# Patient Record
Sex: Female | Born: 1935
Health system: Southern US, Community
[De-identification: ages and names within clinical notes are randomized; demographics above are authoritative.]

## PROBLEM LIST (undated history)

## (undated) DIAGNOSIS — I4510 Unspecified right bundle-branch block: Secondary | ICD-10-CM

## (undated) DIAGNOSIS — M1990 Unspecified osteoarthritis, unspecified site: Secondary | ICD-10-CM

## (undated) DIAGNOSIS — I10 Essential (primary) hypertension: Secondary | ICD-10-CM

## (undated) DIAGNOSIS — R002 Palpitations: Secondary | ICD-10-CM

## (undated) DIAGNOSIS — Z8679 Personal history of other diseases of the circulatory system: Secondary | ICD-10-CM

## (undated) HISTORY — DX: Palpitations: R00.2

## (undated) HISTORY — DX: Essential (primary) hypertension: I10

## (undated) HISTORY — DX: Unspecified right bundle-branch block: I45.10

## (undated) HISTORY — DX: Personal history of other diseases of the circulatory system: Z86.79

---

## 1999-11-21 ENCOUNTER — Ambulatory Visit (HOSPITAL_BASED_OUTPATIENT_CLINIC_OR_DEPARTMENT_OTHER): Admission: RE | Admit: 1999-11-21 | Discharge: 1999-11-21 | Payer: Self-pay | Admitting: Ophthalmology

## 2000-05-03 ENCOUNTER — Emergency Department (HOSPITAL_COMMUNITY): Admission: EM | Admit: 2000-05-03 | Discharge: 2000-05-03 | Payer: Self-pay | Admitting: Emergency Medicine

## 2000-05-03 ENCOUNTER — Encounter: Payer: Self-pay | Admitting: Emergency Medicine

## 2000-09-13 ENCOUNTER — Ambulatory Visit (HOSPITAL_COMMUNITY): Admission: RE | Admit: 2000-09-13 | Discharge: 2000-09-13 | Payer: Self-pay | Admitting: Family Medicine

## 2000-09-13 ENCOUNTER — Encounter: Payer: Self-pay | Admitting: Family Medicine

## 2001-07-21 ENCOUNTER — Emergency Department (HOSPITAL_COMMUNITY): Admission: EM | Admit: 2001-07-21 | Discharge: 2001-07-21 | Payer: Self-pay | Admitting: Emergency Medicine

## 2001-07-21 ENCOUNTER — Encounter: Payer: Self-pay | Admitting: Emergency Medicine

## 2002-06-18 ENCOUNTER — Emergency Department (HOSPITAL_COMMUNITY): Admission: EM | Admit: 2002-06-18 | Discharge: 2002-06-18 | Payer: Self-pay | Admitting: *Deleted

## 2002-06-18 ENCOUNTER — Encounter: Payer: Self-pay | Admitting: Emergency Medicine

## 2002-12-12 ENCOUNTER — Emergency Department (HOSPITAL_COMMUNITY): Admission: EM | Admit: 2002-12-12 | Discharge: 2002-12-12 | Payer: Self-pay | Admitting: Emergency Medicine

## 2003-04-06 ENCOUNTER — Emergency Department (HOSPITAL_COMMUNITY): Admission: EM | Admit: 2003-04-06 | Discharge: 2003-04-06 | Payer: Self-pay | Admitting: Emergency Medicine

## 2003-04-06 ENCOUNTER — Encounter: Payer: Self-pay | Admitting: Emergency Medicine

## 2003-10-13 ENCOUNTER — Ambulatory Visit (HOSPITAL_COMMUNITY): Admission: RE | Admit: 2003-10-13 | Discharge: 2003-10-13 | Payer: Self-pay | Admitting: Ophthalmology

## 2003-11-11 ENCOUNTER — Ambulatory Visit (HOSPITAL_COMMUNITY): Admission: RE | Admit: 2003-11-11 | Discharge: 2003-11-11 | Payer: Self-pay | Admitting: Vascular Surgery

## 2010-11-20 DIAGNOSIS — I4510 Unspecified right bundle-branch block: Secondary | ICD-10-CM

## 2010-11-20 HISTORY — DX: Unspecified right bundle-branch block: I45.10

## 2013-03-18 ENCOUNTER — Telehealth: Payer: Self-pay | Admitting: Internal Medicine

## 2013-03-18 NOTE — Telephone Encounter (Signed)
Please allow the phone to ring several times per Shannon Cruz . It takes her a little time to get to the phone .  Thanks

## 2013-03-18 NOTE — Telephone Encounter (Signed)
Pt is not feeling well.  Feet are swelling.  She is out of her pills.Marland KitchenMarland Kitchen

## 2013-03-18 NOTE — Telephone Encounter (Signed)
Returned call.  Pt c/o swelling around her feet and ankles.  Also c/o intermittent CP and SOB.  Denied both today.  Stated just swelling and she has not had her pills in a month.  Pt having difficulty hearing.  RN informed pt will call her back.  Call to pharmacy and informed pt has not refilled lisinopril/hctz or metoprolol since Jan. 2014.  Pt's sons on ROI and calls to them without success.  Serita Butcher, NP notified and advised appt tomorrow.  Appt scheduled w/ B. Hager, PA-C at 3:20pm and confirmed w/ pt.  Agreed to keep appt.

## 2013-03-19 ENCOUNTER — Encounter: Payer: Self-pay | Admitting: Pharmacist Clinician (PhC)/ Clinical Pharmacy Specialist

## 2013-03-19 ENCOUNTER — Encounter: Payer: Self-pay | Admitting: Internal Medicine

## 2013-03-19 ENCOUNTER — Encounter: Payer: Self-pay | Admitting: Physician Assistant

## 2013-03-19 ENCOUNTER — Ambulatory Visit (INDEPENDENT_AMBULATORY_CARE_PROVIDER_SITE_OTHER): Payer: PRIVATE HEALTH INSURANCE | Admitting: Physician Assistant

## 2013-03-19 VITALS — BP 168/72 | HR 80 | Ht 60.0 in | Wt 144.8 lb

## 2013-03-19 DIAGNOSIS — M25519 Pain in unspecified shoulder: Secondary | ICD-10-CM

## 2013-03-19 DIAGNOSIS — I4510 Unspecified right bundle-branch block: Secondary | ICD-10-CM | POA: Insufficient documentation

## 2013-03-19 DIAGNOSIS — I10 Essential (primary) hypertension: Secondary | ICD-10-CM

## 2013-03-19 DIAGNOSIS — M25512 Pain in left shoulder: Secondary | ICD-10-CM

## 2013-03-19 MED ORDER — LISINOPRIL-HYDROCHLOROTHIAZIDE 20-25 MG PO TABS: 1.0000 | ORAL_TABLET | Freq: Every day | ORAL | Status: DC

## 2013-03-19 MED ORDER — METOPROLOL SUCCINATE ER 25 MG PO TB24: 12.5000 mg | ORAL_TABLET | Freq: Every day | ORAL | Status: DC

## 2013-03-19 NOTE — Assessment & Plan Note (Signed)
Pressure is elevated however the patient reports running out of her medications. New prescriptions will be supplied.

## 2013-03-19 NOTE — Progress Notes (Signed)
Date:  03/19/2013   ID:  Shannon Cruz, DOB 01-Feb-1925, MRN [id number]  PCP:  No primary provider on file.  Primary Cardiologist:  Dr. Debara Pickett    History of Present Illness: Shannon Cruz is a 79 y.o. female the past medical history of hypertension hyperlipidemia and right bundle branch block. Patient reports today with complaints of the left shoulder pain which is also complaint back in January of 2014. Patient states it is worse with movement particularly when she is brushing her hair. She also reports some right lower extremity edema in her foot and ankle. She denies nausea, vomiting, fever, chest pain, shortness of breath, orthopnea, PND, dizziness.   Wt Readings from Last 3 Encounters:  03/19/13 144 lb 12.8 oz (65.681 kg)     Past Medical History  Diagnosis Date  . RBBB (right bundle branch block) 11/20/2010    Echo - EF >55%; flow pattern suggestive of impaired LV relaxation, proximal septal thickening noted; mitral valve mildly thickened; mild mitral annular calcification; aortic root sclerosis/calcification  . Hypertension   . H/O diastolic dysfunction   . Heart palpitations     Current Outpatient Prescriptions  Medication Sig Dispense Refill  . aspirin EC 81 MG tablet Take 81 mg by mouth daily.      Marland Kitchen lisinopril-hydrochlorothiazide (PRINZIDE,ZESTORETIC) 20-25 MG per tablet Take 1 tablet by mouth daily.      Marland Kitchen MAGNESIUM SALICYLATE PO Take 2 tablets by mouth 2 (two) times daily.      . metoprolol succinate (TOPROL-XL) 25 MG 24 hr tablet Take 12.5 mg by mouth daily.       No current facility-administered medications for this visit.    Allergies:   No Known Allergies  Social History:  The patient  reports that she quit smoking about 30 years ago. She does not have any smokeless tobacco history on file.   ROS:  Please see the history of present illness.    All other systems reviewed and negative.   PHYSICAL EXAM: VS:  BP 168/72  Pulse 80  Ht 5' (1.524 m)  Wt 144 lb  12.8 oz (65.681 kg)  BMI 28.28 kg/m2 Well nourished, well developed, in no acute distress HEENT: Pupils equal round reactive to light accommodation extraocular movements are intact Neck: Nontender supple no lymphadenopathy. Cardiac:  normal S1, S2; RRR; no murmur Lungs:  clear to auscultation bilaterally, no wheezing, rhonchi or rales Ext: Trace right lower extremity edema Skin: warm and dry Neuro:  Grossly normal  EKG:     Right bundle branch block normal sinus rhythm rate 80 beats per minute.  ASSESSMENT AND PLAN:  Problem List Items Addressed This Visit   HTN (hypertension)     Pressure is elevated however the patient reports running out of her medications. New prescriptions will be supplied.    RBBB (right bundle branch block)     Chronic problem.    Left shoulder pain - Primary     Left shoulder pain muscular skeletal and likely some osteoarthritis.     Other Visit Diagnoses   Essential hypertension, malignant        Relevant Orders       EKG 12-Lead

## 2013-03-19 NOTE — Assessment & Plan Note (Signed)
Chronic problem. 

## 2013-03-19 NOTE — Assessment & Plan Note (Signed)
Left shoulder pain muscular skeletal and likely some osteoarthritis.

## 2013-10-09 ENCOUNTER — Ambulatory Visit (INDEPENDENT_AMBULATORY_CARE_PROVIDER_SITE_OTHER): Payer: PRIVATE HEALTH INSURANCE | Admitting: Internal Medicine

## 2013-10-09 ENCOUNTER — Encounter: Payer: Self-pay | Admitting: Internal Medicine

## 2013-10-09 VITALS — BP 120/62 | HR 80 | Ht 60.0 in | Wt 140.6 lb

## 2013-10-09 DIAGNOSIS — I10 Essential (primary) hypertension: Secondary | ICD-10-CM

## 2013-10-09 DIAGNOSIS — I498 Other specified cardiac arrhythmias: Secondary | ICD-10-CM | POA: Insufficient documentation

## 2013-10-09 DIAGNOSIS — I4510 Unspecified right bundle-branch block: Secondary | ICD-10-CM

## 2013-10-09 MED ORDER — HYDROCHLOROTHIAZIDE 25 MG PO TABS: 25.0000 mg | ORAL_TABLET | Freq: Every day | ORAL | Status: DC

## 2013-10-09 MED ORDER — METOPROLOL SUCCINATE ER 25 MG PO TB24: 25.0000 mg | ORAL_TABLET | Freq: Every day | ORAL | Status: DC

## 2013-10-09 MED ORDER — LISINOPRIL 10 MG PO TABS: 10.0000 mg | ORAL_TABLET | Freq: Every day | ORAL | Status: DC

## 2013-10-09 NOTE — Progress Notes (Signed)
OFFICE NOTE  Chief Complaint:  Grieving over loss of her son  Primary Care Physician: No primary provider on file.  HPI:  Shannon Cruz is a pleasant 79 year old female with hypertension and some difficulty with gait and dizziness. She has a known right bundle branch block and some diastolic dysfunction. She has recently had some left shoulder pain as well as some palpitations and is not on a beta-blocker. She denies any chest pain, worsening shortness of breath or lower extremity swelling. She was noted in the recent past to have atrial bigeminy and continues in that rhythm today. She occasionally takes a full dose of Toprol XL with improved control of her palpitations. Her main issue today is the recent death of her son but she outlined in great detail in the office. He died of a liver tumor.  PMHx:  Past Medical History  Diagnosis Date  . RBBB (right bundle branch block) 11/20/2010    Echo - EF >55%; flow pattern suggestive of impaired LV relaxation, proximal septal thickening noted; mitral valve mildly thickened; mild mitral annular calcification; aortic root sclerosis/calcification  . Hypertension   . H/O diastolic dysfunction   . Heart palpitations     History reviewed. No pertinent past surgical history.  FAMHx:  No family history on file.  SOCHx:   reports that she quit smoking about 30 years ago. She does not have any smokeless tobacco history on file. Her alcohol and drug histories are not on file.  ALLERGIES:  No Known Allergies  ROS: A comprehensive review of systems was negative except for: Cardiovascular: positive for palpitations  HOME MEDS: Current Outpatient Prescriptions  Medication Sig Dispense Refill  . aspirin EC 81 MG tablet Take 81 mg by mouth daily.      Marland Kitchen MAGNESIUM SALICYLATE PO Take by mouth. Takes occasionally      . metoprolol succinate (TOPROL-XL) 25 MG 24 hr tablet Take 1 tablet (25 mg total) by mouth daily.  90 tablet  3  . RESTASIS  0.05 % ophthalmic emulsion Place 1 drop into both eyes 2 (two) times daily.      Marland Kitchen tobramycin-dexamethasone (TOBRADEX) ophthalmic solution daily.      . hydrochlorothiazide (HYDRODIURIL) 25 MG tablet Take 1 tablet (25 mg total) by mouth daily.  90 tablet  3  . lisinopril (PRINIVIL,ZESTRIL) 10 MG tablet Take 1 tablet (10 mg total) by mouth daily.  90 tablet  3   No current facility-administered medications for this visit.    LABS/IMAGING: No results found for this or any previous visit (from the past 48 hour(s)). No results found.  VITALS: BP 120/62  Pulse 80  Ht 5' (1.524 m)  Wt 140 lb 9.6 oz (63.776 kg)  BMI 27.46 kg/m2  EXAM: General appearance: alert and no distress Neck: no carotid bruit and no JVD Lungs: clear to auscultation bilaterally Heart: regular rate and rhythm Abdomen: soft, non-tender; bowel sounds normal; no masses,  no organomegaly Extremities: extremities normal, atraumatic, no cyanosis or edema Pulses: 2+ and symmetric Skin: Skin color, texture, turgor normal. No rashes or lesions Neurologic: Grossly normal Psych: Tearful, grieving  EKG: Sinus rhythm with atrial bigeminy at 80, right bundle branch block  ASSESSMENT: 1. Atrial bigeminy 2. Hypertension-controlled 3. Grieving  PLAN: 1.   Mrs. Clendenning continues to have atrial bigeminy which was not suppressed by low-dose Toprol-XL. I recommend increasing her Toprol-XL 25 mg daily. Because her blood pressure is low normal, I would therefore decrease her lisinopril HCTZ 10/25 mg  daily. Plan to see her back in 6 months.  Pixie Casino, MD, [facility] Attending Cardiologist [facility]  Monquie Fulgham C 10/09/2013, 3:31 PM

## 2013-10-09 NOTE — Patient Instructions (Addendum)
Your physician has recommended you make the following change in your medication:  TAKE metoprolol succinate (Toprol XL) 25mg  daily. TAKE lisinopril 10mg  & hydrochlorothiazide 25mg   Your physician wants you to follow-up in: 6 months. You will receive a reminder letter in the mail two months in advance. If you don't receive a letter, please call our office to schedule the follow-up appointment.

## 2014-02-12 DIAGNOSIS — N390 Urinary tract infection, site not specified: Secondary | ICD-10-CM | POA: Insufficient documentation

## 2014-02-12 DIAGNOSIS — N289 Disorder of kidney and ureter, unspecified: Secondary | ICD-10-CM | POA: Insufficient documentation

## 2014-02-12 DIAGNOSIS — IMO0002 Reserved for concepts with insufficient information to code with codable children: Secondary | ICD-10-CM | POA: Insufficient documentation

## 2014-02-12 DIAGNOSIS — Z7982 Long term (current) use of aspirin: Secondary | ICD-10-CM | POA: Insufficient documentation

## 2014-02-12 DIAGNOSIS — M549 Dorsalgia, unspecified: Secondary | ICD-10-CM | POA: Insufficient documentation

## 2014-02-12 DIAGNOSIS — Z87891 Personal history of nicotine dependence: Secondary | ICD-10-CM | POA: Insufficient documentation

## 2014-02-12 DIAGNOSIS — I4510 Unspecified right bundle-branch block: Secondary | ICD-10-CM | POA: Insufficient documentation

## 2014-02-12 DIAGNOSIS — Z79899 Other long term (current) drug therapy: Secondary | ICD-10-CM | POA: Insufficient documentation

## 2014-02-12 DIAGNOSIS — I10 Essential (primary) hypertension: Secondary | ICD-10-CM | POA: Insufficient documentation

## 2014-02-13 ENCOUNTER — Emergency Department (HOSPITAL_COMMUNITY): Payer: PRIVATE HEALTH INSURANCE

## 2014-02-13 ENCOUNTER — Encounter (HOSPITAL_COMMUNITY): Payer: Self-pay | Admitting: Emergency Medicine

## 2014-02-13 ENCOUNTER — Emergency Department (HOSPITAL_COMMUNITY)
Admission: EM | Admit: 2014-02-13 | Discharge: 2014-02-13 | Disposition: A | Discharge status: Home / Self Care | Payer: PRIVATE HEALTH INSURANCE | Attending: Emergency Medicine | Admitting: Emergency Medicine

## 2014-02-13 DIAGNOSIS — M549 Dorsalgia, unspecified: Secondary | ICD-10-CM

## 2014-02-13 DIAGNOSIS — N390 Urinary tract infection, site not specified: Secondary | ICD-10-CM

## 2014-02-13 DIAGNOSIS — N289 Disorder of kidney and ureter, unspecified: Secondary | ICD-10-CM

## 2014-02-13 LAB — CBC WITH DIFFERENTIAL/PLATELET
Basophils Absolute: 0.1 10*3/uL (ref 0.0–0.1)
Basophils Relative: 1 % (ref 0–1)
EOS PCT: 4 % (ref 0–5)
Eosinophils Absolute: 0.3 10*3/uL (ref 0.0–0.7)
HEMATOCRIT: 34.6 % — AB (ref 36.0–46.0)
Hemoglobin: 11 g/dL — ABNORMAL LOW (ref 12.0–15.0)
LYMPHS ABS: 1.8 10*3/uL (ref 0.7–4.0)
Lymphocytes Relative: 18 % (ref 12–46)
MCH: 26.8 pg (ref 26.0–34.0)
MCHC: 31.8 g/dL (ref 30.0–36.0)
MCV: 84.2 fL (ref 78.0–100.0)
Monocytes Absolute: 0.8 10*3/uL (ref 0.1–1.0)
Monocytes Relative: 8 % (ref 3–12)
Neutro Abs: 6.8 10*3/uL (ref 1.7–7.7)
Neutrophils Relative %: 70 % (ref 43–77)
Platelets: 298 10*3/uL (ref 150–400)
RBC: 4.11 MIL/uL (ref 3.87–5.11)
RDW: 14.4 % (ref 11.5–15.5)
WBC: 9.8 10*3/uL (ref 4.0–10.5)

## 2014-02-13 LAB — COMPREHENSIVE METABOLIC PANEL
ALT: 7 U/L (ref 0–35)
AST: 20 U/L (ref 0–37)
Albumin: 3.7 g/dL (ref 3.5–5.2)
Alkaline Phosphatase: 79 U/L (ref 39–117)
BUN: 43 mg/dL — ABNORMAL HIGH (ref 6–23)
CALCIUM: 10.1 mg/dL (ref 8.4–10.5)
CO2: 24 meq/L (ref 19–32)
CREATININE: 1.57 mg/dL — AB (ref 0.50–1.10)
Chloride: 99 mEq/L (ref 96–112)
GFR, EST AFRICAN AMERICAN: 33 mL/min — AB (ref >90)
GFR, EST NON AFRICAN AMERICAN: 29 mL/min — AB (ref >90)
GLUCOSE: 107 mg/dL — AB (ref 70–99)
Potassium: 4 mEq/L (ref 3.7–5.3)
Sodium: 138 mEq/L (ref 137–147)
Total Bilirubin: <0.2 mg/dL — ABNORMAL LOW (ref 0.3–1.2)
Total Protein: 7.8 g/dL (ref 6.0–8.3)

## 2014-02-13 LAB — URINALYSIS, ROUTINE W REFLEX MICROSCOPIC
Bilirubin Urine: NEGATIVE
Glucose, UA: NEGATIVE mg/dL
Ketones, ur: NEGATIVE mg/dL
Nitrite: POSITIVE — AB
PROTEIN: NEGATIVE mg/dL
Specific Gravity, Urine: 1.01 (ref 1.005–1.030)
Urobilinogen, UA: 0.2 mg/dL (ref 0.0–1.0)
pH: 5.5 (ref 5.0–8.0)

## 2014-02-13 LAB — URINE MICROSCOPIC-ADD ON

## 2014-02-13 LAB — LIPASE, BLOOD: LIPASE: 56 U/L (ref 11–59)

## 2014-02-13 MED ORDER — ONDANSETRON 8 MG PO TBDP: 8.0000 mg | ORAL_TABLET | Freq: Three times a day (TID) | ORAL | Status: DC | PRN

## 2014-02-13 MED ORDER — CEPHALEXIN 500 MG PO CAPS: 500.0000 mg | ORAL_CAPSULE | Freq: Four times a day (QID) | ORAL | Status: DC

## 2014-02-13 MED ORDER — HYDROCODONE-ACETAMINOPHEN 5-325 MG PO TABS: 1.0000 | ORAL_TABLET | ORAL | Status: DC | PRN

## 2014-02-13 MED ORDER — DEXTROSE 5 % IV SOLN
1.0000 g | Freq: Once | INTRAVENOUS | Status: AC
  Administered 2014-02-13: 1 g via INTRAVENOUS
  Filled 2014-02-13: qty 10

## 2014-02-13 NOTE — ED Provider Notes (Signed)
CSN: [id number]     Arrival date & time 02/12/14  2356 History   First MD Initiated Contact with Patient 02/13/14 0330     Chief Complaint  Patient presents with  . Back Pain      HPI Patient reports developing low back pain and fever earlier today.  She denies urinary complaints.  She states the pain radiates around to her abdomen and is primarily located on the left side with radiation towards the left abdomen.  She denies anterior abdominal pain.  No chest pain or shortness of breath.  No chills.  No documentation of fever at home, more subjective fever.  Patient is a history of hypertension.  No diarrhea.  Mild nausea.  No vomiting.  Past Medical History  Diagnosis Date  . RBBB (right bundle branch block) 11/20/2010    Echo - EF >55%; flow pattern suggestive of impaired LV relaxation, proximal septal thickening noted; mitral valve mildly thickened; mild mitral annular calcification; aortic root sclerosis/calcification  . Hypertension   . H/O diastolic dysfunction   . Heart palpitations    History reviewed. No pertinent past surgical history. History reviewed. No pertinent family history. History  Substance Use Topics  . Smoking status: Former Smoker    Quit date: 11/05/1982  . Smokeless tobacco: Not on file  . Alcohol Use: No   OB History   Grav Para Term Preterm Abortions TAB SAB Ect Mult Living                 Review of Systems  All other systems reviewed and are negative.     Allergies  Review of patient's allergies indicates no known allergies.  Home Medications   Current Outpatient Rx  Name  Route  Sig  Dispense  Refill  . aspirin EC 81 MG tablet   Oral   Take 81 mg by mouth daily.         . hydrochlorothiazide (HYDRODIURIL) 25 MG tablet   Oral   Take 1 tablet (25 mg total) by mouth daily.   90 tablet   3   . ibuprofen (ADVIL,MOTRIN) 600 MG tablet   Oral   Take 1,200 mg by mouth 2 (two) times daily as needed for moderate pain.         Marland Kitchen  lisinopril (PRINIVIL,ZESTRIL) 10 MG tablet   Oral   Take 1 tablet (10 mg total) by mouth daily.   90 tablet   3   . metoprolol succinate (TOPROL-XL) 25 MG 24 hr tablet   Oral   Take 1 tablet (25 mg total) by mouth daily.   90 tablet   3   . prednisoLONE acetate (PRED FORTE) 1 % ophthalmic suspension   Both Eyes   Place 1 drop into both eyes 2 (two) times daily.         . cephALEXin (KEFLEX) 500 MG capsule   Oral   Take 1 capsule (500 mg total) by mouth 4 (four) times daily.   28 capsule   0   . HYDROcodone-acetaminophen (NORCO/VICODIN) 5-325 MG per tablet   Oral   Take 1 tablet by mouth every 4 (four) hours as needed for moderate pain.   15 tablet   0    BP 139/56  Pulse 64  Temp(Src) 98.2 F (36.8 C) (Oral)  Resp 17  Wt 132 lb (59.875 kg)  SpO2 99% Physical Exam  Nursing note and vitals reviewed. Constitutional: She is oriented to person, place, and time. She appears well-developed and  well-nourished. No distress.  HENT:  Head: Normocephalic and atraumatic.  Eyes: EOM are normal.  Neck: Normal range of motion.  Cardiovascular: Normal rate, regular rhythm and normal heart sounds.   Pulmonary/Chest: Effort normal and breath sounds normal.  Abdominal: Soft. She exhibits no distension. There is no tenderness.  Genitourinary:  Mild left CVA tenderness.  No right CVA tenderness.  Musculoskeletal: Normal range of motion.  Neurological: She is alert and oriented to person, place, and time.  Skin: Skin is warm and dry.  Psychiatric: She has a normal mood and affect. Judgment normal.    ED Course  Procedures (including critical care time) Labs Review Labs Reviewed  URINALYSIS, ROUTINE W REFLEX MICROSCOPIC - Abnormal; Notable for the following:    APPearance CLOUDY (*)    Hgb urine dipstick TRACE (*)    Nitrite POSITIVE (*)    Leukocytes, UA LARGE (*)    All other components within normal limits  CBC WITH DIFFERENTIAL - Abnormal; Notable for the following:     Hemoglobin 11.0 (*)    HCT 34.6 (*)    All other components within normal limits  COMPREHENSIVE METABOLIC PANEL - Abnormal; Notable for the following:    Glucose, Bld 107 (*)    BUN 43 (*)    Creatinine, Ser 1.57 (*)    Total Bilirubin <0.2 (*)    GFR calc non Af Amer 29 (*)    GFR calc Af Amer 33 (*)    All other components within normal limits  URINE MICROSCOPIC-ADD ON - Abnormal; Notable for the following:    Bacteria, UA MANY (*)    All other components within normal limits  URINE CULTURE  LIPASE, BLOOD   Imaging Review Ct Abdomen Pelvis Wo Contrast  02/13/2014   CLINICAL DATA:  Fever and right flank pain since yesterday.  EXAM: CT ABDOMEN AND PELVIS WITHOUT CONTRAST  TECHNIQUE: Multidetector CT imaging of the abdomen and pelvis was performed following the standard protocol without intravenous contrast.  COMPARISON:  None.  FINDINGS: Lung bases are clear.  No renal, ureteral, or bladder stones are visualized. No pyelocaliectasis or ureterectasis. No bladder wall thickening. Low-attenuation lesion in the upper pole of the left kidney with Hounsfield unit measurements consistent with cyst. Multiple calcified phleboliths in the pelvis.  There is a 16 mm diameter rounded calcification in the right lower quadrant which may represent dystrophic calcification the appendix is normal. Scattered calcified granulomas in the liver. The spleen size is normal. Gallbladder, pancreas, adrenal glands, inferior vena cava, and retroperitoneal lymph nodes are unremarkable. Calcification of abdominal aorta with focal ectasia. No aneurysm. Small and large bowel are decompressed. No free air or free fluid in the abdomen.  Pelvis: Calcifications in the uterus consistent with fibroids. No abnormal adnexal masses. No free or loculated pelvic fluid collections. Diverticula in the sigmoid colon without diverticulitis. Degenerative changes in the spine and hips. No destructive bone lesions appreciated.  IMPRESSION: No  renal or ureteral stone or obstruction. Presumed left renal cyst. No acute process suggest.   Electronically Signed   By: Lucienne Capers M.D.   On: 02/13/2014 05:38  I personally reviewed the imaging tests through PACS system I reviewed available ER/hospitalization records through the EMR    EKG Interpretation None      MDM   Final diagnoses:  Back pain  Urinary tract infection  Renal insufficiency    6:23 AM Patient feels much better at this time.  Discharge home in good condition.  Suspect urinary  tract infection with developing a sending infection/pyelonephritis.  Vital signs stable.  Instructions given to patient and patient's son.  They understand to return to the ER for new or worsening symptoms.  Rocephin in the emergency department.  Home with Keflex.  Urine culture.  Home with pain medication and nausea medicine.    Hoy Morn, MD 02/13/14 [phone]

## 2014-02-13 NOTE — ED Notes (Signed)
MD at bedside. 

## 2014-02-13 NOTE — ED Notes (Signed)
Patient transported to CT 

## 2014-02-13 NOTE — ED Notes (Signed)
Pt arrived to the Ed with a complaint of back pain that lead to a fever.  Pt had a pain in the back at 1630 yesterday.  Pt used rubbing alcohol on her extremities and took 1200 mg Ibuprofen.  Pt states her lower back still hurts

## 2014-02-13 NOTE — Discharge Instructions (Signed)
Urinary Tract Infection  Urinary tract infections (UTIs) can develop anywhere along your urinary tract. Your urinary tract is your body's drainage system for removing wastes and extra water. Your urinary tract includes two kidneys, two ureters, a bladder, and a urethra. Your kidneys are a pair of bean-shaped organs. Each kidney is about the size of your fist. They are located below your ribs, one on each side of your spine.  CAUSES  Infections are caused by microbes, which are microscopic organisms, including fungi, viruses, and bacteria. These organisms are so small that they can only be seen through a microscope. Bacteria are the microbes that most commonly cause UTIs.  SYMPTOMS   Symptoms of UTIs may vary by age and gender of the patient and by the location of the infection. Symptoms in young women typically include a frequent and intense urge to urinate and a painful, burning feeling in the bladder or urethra during urination. Older women and men are more likely to be tired, shaky, and weak and have muscle aches and abdominal pain. A fever may mean the infection is in your kidneys. Other symptoms of a kidney infection include pain in your back or sides below the ribs, nausea, and vomiting.  DIAGNOSIS  To diagnose a UTI, your caregiver will ask you about your symptoms. Your caregiver also will ask to provide a urine sample. The urine sample will be tested for bacteria and white blood cells. White blood cells are made by your body to help fight infection.  TREATMENT   Typically, UTIs can be treated with medication. Because most UTIs are caused by a bacterial infection, they usually can be treated with the use of antibiotics. The choice of antibiotic and length of treatment depend on your symptoms and the type of bacteria causing your infection.  HOME CARE INSTRUCTIONS   If you were prescribed antibiotics, take them exactly as your caregiver instructs you. Finish the medication even if you feel better after you  have only taken some of the medication.   Drink enough water and fluids to keep your urine clear or pale yellow.   Avoid caffeine, tea, and carbonated beverages. They tend to irritate your bladder.   Empty your bladder often. Avoid holding urine for long periods of time.   Empty your bladder before and after sexual intercourse.   After a bowel movement, women should cleanse from front to back. Use each tissue only once.  SEEK MEDICAL CARE IF:    You have back pain.   You develop a fever.   Your symptoms do not begin to resolve within 3 days.  SEEK IMMEDIATE MEDICAL CARE IF:    You have severe back pain or lower abdominal pain.   You develop chills.   You have nausea or vomiting.   You have continued burning or discomfort with urination.  MAKE SURE YOU:    Understand these instructions.   Will watch your condition.   Will get help right away if you are not doing well or get worse.  Document Released: 08/01/2005 Document Revised: 04/22/2012 Document Reviewed: 11/30/2011  [employer] Patient Information 2014 [employer]

## 2014-02-15 LAB — URINE CULTURE: Colony Count: >=100000

## 2014-04-12 ENCOUNTER — Ambulatory Visit: Payer: PRIVATE HEALTH INSURANCE | Admitting: Internal Medicine

## 2014-04-27 ENCOUNTER — Ambulatory Visit: Payer: PRIVATE HEALTH INSURANCE | Admitting: Internal Medicine

## 2014-05-06 ENCOUNTER — Ambulatory Visit: Payer: PRIVATE HEALTH INSURANCE | Admitting: Internal Medicine

## 2014-06-08 ENCOUNTER — Ambulatory Visit: Payer: PRIVATE HEALTH INSURANCE | Admitting: Internal Medicine

## 2014-07-06 ENCOUNTER — Other Ambulatory Visit (HOSPITAL_COMMUNITY): Payer: Self-pay | Admitting: Family Medicine

## 2014-07-06 DIAGNOSIS — I498 Other specified cardiac arrhythmias: Secondary | ICD-10-CM

## 2014-07-07 ENCOUNTER — Other Ambulatory Visit (HOSPITAL_COMMUNITY): Payer: PRIVATE HEALTH INSURANCE

## 2014-07-12 ENCOUNTER — Emergency Department (HOSPITAL_COMMUNITY)
Admission: EM | Admit: 2014-07-12 | Discharge: 2014-07-12 | Disposition: A | Discharge status: Home / Self Care | Payer: PRIVATE HEALTH INSURANCE | Attending: Emergency Medicine | Admitting: Emergency Medicine

## 2014-07-12 ENCOUNTER — Emergency Department (HOSPITAL_COMMUNITY): Payer: PRIVATE HEALTH INSURANCE

## 2014-07-12 ENCOUNTER — Encounter (HOSPITAL_COMMUNITY): Payer: Self-pay | Admitting: Emergency Medicine

## 2014-07-12 DIAGNOSIS — IMO0002 Reserved for concepts with insufficient information to code with codable children: Secondary | ICD-10-CM | POA: Diagnosis not present

## 2014-07-12 DIAGNOSIS — Z7689 Persons encountering health services in other specified circumstances: Secondary | ICD-10-CM | POA: Insufficient documentation

## 2014-07-12 DIAGNOSIS — M129 Arthropathy, unspecified: Secondary | ICD-10-CM | POA: Diagnosis not present

## 2014-07-12 DIAGNOSIS — I498 Other specified cardiac arrhythmias: Secondary | ICD-10-CM | POA: Insufficient documentation

## 2014-07-12 DIAGNOSIS — I10 Essential (primary) hypertension: Secondary | ICD-10-CM | POA: Insufficient documentation

## 2014-07-12 DIAGNOSIS — Z7982 Long term (current) use of aspirin: Secondary | ICD-10-CM | POA: Insufficient documentation

## 2014-07-12 DIAGNOSIS — Z79899 Other long term (current) drug therapy: Secondary | ICD-10-CM | POA: Insufficient documentation

## 2014-07-12 DIAGNOSIS — R008 Other abnormalities of heart beat: Secondary | ICD-10-CM

## 2014-07-12 HISTORY — DX: Unspecified osteoarthritis, unspecified site: M19.90

## 2014-07-12 LAB — URINALYSIS, ROUTINE W REFLEX MICROSCOPIC
Bilirubin Urine: NEGATIVE
Glucose, UA: NEGATIVE mg/dL
Hgb urine dipstick: NEGATIVE
KETONES UR: NEGATIVE mg/dL
Leukocytes, UA: NEGATIVE
NITRITE: NEGATIVE
Protein, ur: NEGATIVE mg/dL
SPECIFIC GRAVITY, URINE: 1.005 (ref 1.005–1.030)
Urobilinogen, UA: 0.2 mg/dL (ref 0.0–1.0)
pH: 6.5 (ref 5.0–8.0)

## 2014-07-12 LAB — COMPREHENSIVE METABOLIC PANEL
ALT: 6 U/L (ref 0–35)
ANION GAP: 14 (ref 5–15)
AST: 20 U/L (ref 0–37)
Albumin: 3.6 g/dL (ref 3.5–5.2)
Alkaline Phosphatase: 98 U/L (ref 39–117)
BILIRUBIN TOTAL: 0.3 mg/dL (ref 0.3–1.2)
BUN: 26 mg/dL — AB (ref 6–23)
CHLORIDE: 96 meq/L (ref 96–112)
CO2: 27 meq/L (ref 19–32)
Calcium: 9.5 mg/dL (ref 8.4–10.5)
Creatinine, Ser: 1.56 mg/dL — ABNORMAL HIGH (ref 0.50–1.10)
GFR calc Af Amer: 33 mL/min — ABNORMAL LOW (ref >90)
GFR, EST NON AFRICAN AMERICAN: 29 mL/min — AB (ref >90)
Glucose, Bld: 108 mg/dL — ABNORMAL HIGH (ref 70–99)
POTASSIUM: 4 meq/L (ref 3.7–5.3)
Sodium: 137 mEq/L (ref 137–147)
Total Protein: 7.7 g/dL (ref 6.0–8.3)

## 2014-07-12 LAB — CBC WITH DIFFERENTIAL/PLATELET
BASOS ABS: 0.1 10*3/uL (ref 0.0–0.1)
Basophils Relative: 1 % (ref 0–1)
EOS PCT: 2 % (ref 0–5)
Eosinophils Absolute: 0.1 10*3/uL (ref 0.0–0.7)
HCT: 36.1 % (ref 36.0–46.0)
Hemoglobin: 11.6 g/dL — ABNORMAL LOW (ref 12.0–15.0)
LYMPHS PCT: 19 % (ref 12–46)
Lymphs Abs: 1.3 10*3/uL (ref 0.7–4.0)
MCH: 26.8 pg (ref 26.0–34.0)
MCHC: 32.1 g/dL (ref 30.0–36.0)
MCV: 83.4 fL (ref 78.0–100.0)
Monocytes Absolute: 0.5 10*3/uL (ref 0.1–1.0)
Monocytes Relative: 8 % (ref 3–12)
NEUTROS ABS: 5.1 10*3/uL (ref 1.7–7.7)
Neutrophils Relative %: 72 % (ref 43–77)
PLATELETS: 405 10*3/uL — AB (ref 150–400)
RBC: 4.33 MIL/uL (ref 3.87–5.11)
RDW: 14.2 % (ref 11.5–15.5)
WBC: 7.1 10*3/uL (ref 4.0–10.5)

## 2014-07-12 LAB — TROPONIN I

## 2014-07-12 NOTE — ED Notes (Signed)
Patient transported to Ultrasound 

## 2014-07-12 NOTE — ED Notes (Signed)
Patient states her doctor sent her here to get tests.   I asked what symptoms she was having, but she states "none".   Patient's daughter at patient side and states that the doctor just wanted a lot of tests to "make sure patient was okay".  But when I asked what problems she had been having, she stated "none" also.

## 2014-07-12 NOTE — ED Notes (Signed)
Pt returned from US

## 2014-07-12 NOTE — ED Notes (Signed)
Dr. Miller at the bedside.  

## 2014-07-12 NOTE — ED Provider Notes (Signed)
CSN: [id number]     Arrival date & time 07/12/14  0831 History   First MD Initiated Contact with Patient 07/12/14 [phone]     Chief Complaint  Patient presents with  . primary doctor sent      (Consider location/radiation/quality/duration/timing/severity/associated sxs/prior Treatment) HPI Comments: 80 y/o female with hx of Htn, heart palpitations (last echo - EF 55% - 2012), presents b/c according to the family members the family doctor wants her to get evaluated with lots of testing because of her slightly slow heart rate. Of note the patient does take Toprol-XL for blood pressure. The patient has absolutely no complaints other than occasional dysuria. The daughter who accompanies the patient today states that this was the first time that she has been able to come to the hospital because of her job but was told 6 days ago that she needed a workup. The patient has not had any chest pain, syncope, lightheadedness, headache, nausea, vomiting or any other complaints whatsoever.  The history is provided by the patient and a relative.    Past Medical History  Diagnosis Date  . RBBB (right bundle branch block) 11/20/2010    Echo - EF >55%; flow pattern suggestive of impaired LV relaxation, proximal septal thickening noted; mitral valve mildly thickened; mild mitral annular calcification; aortic root sclerosis/calcification  . Hypertension   . H/O diastolic dysfunction   . Heart palpitations   . Arthritis    History reviewed. No pertinent past surgical history. No family history on file. History  Substance Use Topics  . Smoking status: Former Smoker    Quit date: 11/05/1982  . Smokeless tobacco: Not on file  . Alcohol Use: No   OB History   Grav Para Term Preterm Abortions TAB SAB Ect Mult Living                 Review of Systems  All other systems reviewed and are negative.     Allergies  Review of patient's allergies indicates no known allergies.  Home Medications   Prior to  Admission medications   Medication Sig Start Date End Date Taking? Authorizing Provider  amLODipine (NORVASC) 10 MG tablet Take 1 tablet by mouth daily. 07/06/14  Yes Historical Provider, MD  aspirin 81 MG tablet Take 81 mg by mouth daily.   Yes Historical Provider, MD  furosemide (LASIX) 20 MG tablet Take 1 tablet by mouth daily. 07/06/14  Yes Historical Provider, MD  HYDROcodone-acetaminophen (NORCO/VICODIN) 5-325 MG per tablet Take 1 tablet by mouth every 4 (four) hours as needed for moderate pain. 02/13/14  Yes Hoy Morn, MD  prednisoLONE acetate (PRED FORTE) 1 % ophthalmic suspension Place 1 drop into both eyes 2 (two) times daily.   Yes Historical Provider, MD   BP 161/56  Pulse 78  Temp(Src) 98.4 F (36.9 C) (Oral)  Resp 19  Ht 5\' 2"  (1.575 m)  Wt 132 lb (59.875 kg)  BMI 24.14 kg/m2  SpO2 100% Physical Exam  Nursing note and vitals reviewed. Constitutional: She appears well-developed and well-nourished. No distress.  HENT:  Head: Normocephalic and atraumatic.  Mouth/Throat: Oropharynx is clear and moist. No oropharyngeal exudate.  Eyes: Conjunctivae and EOM are normal. Pupils are equal, round, and reactive to light. Right eye exhibits no discharge. Left eye exhibits no discharge. No scleral icterus.  Neck: Normal range of motion. Neck supple. No JVD present. No thyromegaly present.  Cardiovascular: Normal heart sounds and intact distal pulses.  Exam reveals no gallop and no friction  rub.   No murmur heard. Auscultated heart sounds, appears to have bigeminy, no murmurs  Pulmonary/Chest: Effort normal and breath sounds normal. No respiratory distress. She has no wheezes. She has no rales.  Abdominal: Soft. Bowel sounds are normal. She exhibits no distension and no mass. There is no tenderness.  Abdominal aorta palpated very easily, feels slightly enlarged on initial exam  Musculoskeletal: Normal range of motion. She exhibits no edema and no tenderness.  Lymphadenopathy:    She  has no cervical adenopathy.  Neurological: She is alert. Coordination normal.  Skin: Skin is warm and dry. No rash noted. No erythema.  Psychiatric: She has a normal mood and affect. Her behavior is normal.    ED Course  Procedures (including critical care time) Labs Review Labs Reviewed  COMPREHENSIVE METABOLIC PANEL - Abnormal; Notable for the following:    Glucose, Bld 108 (*)    BUN 26 (*)    Creatinine, Ser 1.56 (*)    GFR calc non Af Amer 29 (*)    GFR calc Af Amer 33 (*)    All other components within normal limits  CBC WITH DIFFERENTIAL - Abnormal; Notable for the following:    Hemoglobin 11.6 (*)    Platelets 405 (*)    All other components within normal limits  TROPONIN I  URINALYSIS, ROUTINE W REFLEX MICROSCOPIC    Imaging Review US Aorta  07/12/2014   CLINICAL DATA:  Pulsatile abdominal mass.  EXAM: ULTRASOUND OF ABDOMINAL AORTA  TECHNIQUE: Ultrasound examination of the abdominal aorta was performed to evaluate for abdominal aortic aneurysm.  COMPARISON:  Unenhanced CT dated 02/13/2014  FINDINGS: Abdominal Aorta  No aneurysm identified. Diffuse calcified atherosclerotic plaque is present.  Maximum AP  Diameter:  2.3 cm  Maximum TRV  Diameter: 2.7 cm  IMPRESSION: Atherosclerosis of the abdominal aorta without aneurysmal dilatation.   Electronically Signed   By: Aletta Edouard M.D.   On: 07/12/2014 10:45     EKG Interpretation   Date/Time:  Monday July 12 2014 09:02:15 EDT Ventricular Rate:  86 PR Interval:  225 QRS Duration: 155 QT Interval:  459 QTC Calculation: 549 R Axis:   50 Text Interpretation:  Bigeminy. Supraventricular bigeminy Prolonged PR  interval Right bundle branch block Since last tracing bigeminy now present  Right bundle branch block now found Confirmed by Adriana Quinby  MD, Jenniah Bhavsar ([id number])  on 07/12/2014 9:10:31 AM      MDM   Final diagnoses:  Supraventricular bigeminy    The patient is well-appearing, she has no focal symptoms, she has  intermittent dysuria but this is not acute, CT scan from April evaluated, no signs of abdominal aortic aneurysm at that time, ultrasound ordered to confirm. The patient is a beta blocker, a mild bradycardia would not be unexpected, rule out atrioventricular block or other cardiac abnormalities on EKG and electrolytes.  Labs and ECG without acute worrisome findings - d/w pt and family member - they are stable to f/u - no bradycardia seen here - triage VS documented based on sat monitor - there only bigeminy.  Meds given in ED:  Medications - No data to display  New Prescriptions   No medications on file      Johnna Acosta, MD 07/12/14 1117

## 2014-08-02 ENCOUNTER — Encounter: Payer: Self-pay | Admitting: Internal Medicine

## 2014-09-30 ENCOUNTER — Emergency Department (HOSPITAL_COMMUNITY)
Admission: EM | Admit: 2014-09-30 | Discharge: 2014-09-30 | Disposition: A | Discharge status: Home / Self Care | Payer: PRIVATE HEALTH INSURANCE | Attending: Emergency Medicine | Admitting: Emergency Medicine

## 2014-09-30 ENCOUNTER — Emergency Department (HOSPITAL_COMMUNITY): Payer: PRIVATE HEALTH INSURANCE

## 2014-09-30 ENCOUNTER — Encounter (HOSPITAL_COMMUNITY): Payer: Self-pay | Admitting: Emergency Medicine

## 2014-09-30 DIAGNOSIS — Z87891 Personal history of nicotine dependence: Secondary | ICD-10-CM | POA: Diagnosis not present

## 2014-09-30 DIAGNOSIS — I10 Essential (primary) hypertension: Secondary | ICD-10-CM | POA: Insufficient documentation

## 2014-09-30 DIAGNOSIS — Z79899 Other long term (current) drug therapy: Secondary | ICD-10-CM | POA: Insufficient documentation

## 2014-09-30 DIAGNOSIS — Z7982 Long term (current) use of aspirin: Secondary | ICD-10-CM | POA: Diagnosis not present

## 2014-09-30 DIAGNOSIS — M1990 Unspecified osteoarthritis, unspecified site: Secondary | ICD-10-CM | POA: Diagnosis not present

## 2014-09-30 DIAGNOSIS — R079 Chest pain, unspecified: Secondary | ICD-10-CM | POA: Insufficient documentation

## 2014-09-30 DIAGNOSIS — Z7952 Long term (current) use of systemic steroids: Secondary | ICD-10-CM | POA: Diagnosis not present

## 2014-09-30 LAB — BASIC METABOLIC PANEL
Anion gap: 12 (ref 5–15)
BUN: 35 mg/dL — AB (ref 6–23)
CALCIUM: 9.9 mg/dL (ref 8.4–10.5)
CO2: 27 mEq/L (ref 19–32)
CREATININE: 1.8 mg/dL — AB (ref 0.50–1.10)
Chloride: 94 mEq/L — ABNORMAL LOW (ref 96–112)
GFR calc Af Amer: 28 mL/min — ABNORMAL LOW (ref >90)
GFR calc non Af Amer: 24 mL/min — ABNORMAL LOW (ref >90)
GLUCOSE: 102 mg/dL — AB (ref 70–99)
Potassium: 4.1 mEq/L (ref 3.7–5.3)
Sodium: 133 mEq/L — ABNORMAL LOW (ref 137–147)

## 2014-09-30 LAB — CBC
HEMATOCRIT: 33.8 % — AB (ref 36.0–46.0)
HEMOGLOBIN: 10.8 g/dL — AB (ref 12.0–15.0)
MCH: 26.1 pg (ref 26.0–34.0)
MCHC: 32 g/dL (ref 30.0–36.0)
MCV: 81.6 fL (ref 78.0–100.0)
Platelets: 375 10*3/uL (ref 150–400)
RBC: 4.14 MIL/uL (ref 3.87–5.11)
RDW: 15.5 % (ref 11.5–15.5)
WBC: 9.8 10*3/uL (ref 4.0–10.5)

## 2014-09-30 LAB — I-STAT TROPONIN, ED: Troponin i, poc: 0 ng/mL (ref 0.00–0.08)

## 2014-09-30 LAB — PRO B NATRIURETIC PEPTIDE: Pro B Natriuretic peptide (BNP): 157.4 pg/mL (ref 0–450)

## 2014-09-30 MED ORDER — GI COCKTAIL ~~LOC~~
30.0000 mL | Freq: Once | ORAL | Status: AC
  Administered 2014-09-30: 30 mL via ORAL
  Filled 2014-09-30: qty 30

## 2014-09-30 MED ORDER — ASPIRIN 81 MG PO CHEW
324.0000 mg | CHEWABLE_TABLET | Freq: Once | ORAL | Status: AC
  Administered 2014-09-30: 324 mg via ORAL
  Filled 2014-09-30: qty 4

## 2014-09-30 NOTE — ED Provider Notes (Signed)
CSN: [id number]     Arrival date & time 09/30/14  0028 History   First MD Initiated Contact with Patient 09/30/14 0058     Chief Complaint  Patient presents with  . Chest Pain     (Consider location/radiation/quality/duration/timing/severity/associated sxs/prior Treatment) Patient is a 80 y.o. female presenting with chest pain. The history is provided by the patient.  Chest Pain Pain location:  L chest (LUQ) Pain quality comment:  Unable to specify Pain radiates to:  Does not radiate Pain radiates to the back: no   Pain severity:  Mild Onset quality:  Sudden Timing:  Constant Progression:  Waxing and waning Chronicity:  New Context: at rest (was getting ready for bed)   Relieved by:  Nothing Worsened by:  Nothing tried Associated symptoms: no altered mental status, no cough, no fever, no nausea, no numbness and not vomiting     Past Medical History  Diagnosis Date  . RBBB (right bundle branch block) 11/20/2010    Echo - EF >55%; flow pattern suggestive of impaired LV relaxation, proximal septal thickening noted; mitral valve mildly thickened; mild mitral annular calcification; aortic root sclerosis/calcification  . Hypertension   . H/O diastolic dysfunction   . Heart palpitations   . Arthritis    History reviewed. No pertinent past surgical history. No family history on file. History  Substance Use Topics  . Smoking status: Former Smoker    Quit date: 11/05/1982  . Smokeless tobacco: Not on file  . Alcohol Use: No   OB History    No data available     Review of Systems  Constitutional: Negative for fever.  Respiratory: Negative for cough.   Cardiovascular: Positive for chest pain.  Gastrointestinal: Negative for nausea and vomiting.  Neurological: Negative for numbness.  All other systems reviewed and are negative.     Allergies  Review of patient's allergies indicates no known allergies.  Home Medications   Prior to Admission medications   Medication  Sig Start Date End Date Taking? Authorizing Provider  amLODipine (NORVASC) 10 MG tablet Take 1 tablet by mouth daily. 07/06/14   Historical Provider, MD  aspirin 81 MG tablet Take 81 mg by mouth daily.    Historical Provider, MD  furosemide (LASIX) 20 MG tablet Take 1 tablet by mouth daily. 07/06/14   Historical Provider, MD  HYDROcodone-acetaminophen (NORCO/VICODIN) 5-325 MG per tablet Take 1 tablet by mouth every 4 (four) hours as needed for moderate pain. 02/13/14   Hoy Morn, MD  prednisoLONE acetate (PRED FORTE) 1 % ophthalmic suspension Place 1 drop into both eyes 2 (two) times daily.    Historical Provider, MD   BP 150/47 mmHg  Pulse 88  Temp(Src) 99.1 F (37.3 C) (Oral)  Resp 18  SpO2 100% Physical Exam  Constitutional: She is oriented to person, place, and time. She appears well-developed and well-nourished. No distress.  HENT:  Head: Normocephalic and atraumatic.  Mouth/Throat: Oropharynx is clear and moist.  Eyes: EOM are normal. Pupils are equal, round, and reactive to light.  Neck: Normal range of motion. Neck supple.  Cardiovascular: Normal rate and regular rhythm.  Exam reveals no friction rub.   No murmur heard. Pulmonary/Chest: Effort normal and breath sounds normal. No respiratory distress. She has no wheezes. She has no rales.  Abdominal: Soft. She exhibits no distension. There is no tenderness. There is no rebound.  Musculoskeletal: Normal range of motion. She exhibits no edema.  Neurological: She is alert and oriented to person, place, and  time.  Skin: No rash noted. She is not diaphoretic.  Nursing note and vitals reviewed.   ED Course  Procedures (including critical care time) Labs Review Labs Reviewed  [place], ED    Imaging Review Dg Chest 2 View  09/30/2014   CLINICAL DATA:  Sudden onset chest pain.  EXAM: CHEST  2 VIEW  COMPARISON:  None currently available  FINDINGS: Mild  cardiomegaly and aortic tortuosity. The trachea is mildly displaced to the right, without upper mediastinal widening, likely from tortuosity of the great vessels.There is no edema, consolidation, effusion, or pneumothorax.  IMPRESSION: No active cardiopulmonary disease.   Electronically Signed   By: Jorje Guild M.D.   On: 09/30/2014 01:51     EKG Interpretation   Date/Time:  Thursday September 30 2014 00:41:40 EST Ventricular Rate:  90 PR Interval:  48 QRS Duration: 159 QT Interval:  429 QTC Calculation: 525 R Axis:   -16 Text Interpretation:  Ectopic atrial rhythm Supraventricular bigeminy  Short PR interval Consider left atrial enlargement Right bundle branch  block Similar to prior Confirmed by Mingo Amber  MD, [place] ([id number]) on 09/30/2014  1:04:15 AM      MDM   Final diagnoses:  Chest pain    81 year old female here with chest pain. She is a poor historian. States it started on the left side of her chest and she is getting ready for bed. Chest pain does not radiate. Denies any fever, cough, shortness breath, nausea, vomiting. Stated it was better when she sat up. States she's not having pain now. EKG similar to prior. Exam is benign. Due to being a poor historian, will pursue cardiac workup. Troponin x-ray normal. I discussed coming in versus going home with the patient. She does not want to stay in hospital, but she did agree to serial troponins. Troponins normal. Has been chest pain-free since arrival. Instructed to follow-up with PCP. Stable for discharge.  Evelina Bucy, MD 09/30/14 [phone]

## 2014-09-30 NOTE — ED Notes (Signed)
EKG given to EDP,[place],MD for review.

## 2014-09-30 NOTE — Discharge Instructions (Signed)

## 2014-09-30 NOTE — ED Notes (Signed)
Pt states tonight when she went to bed and laid down she got a pain in her left chest she describes as a dull aching pain  Pt states when she sits up it feels better  Pt denies any sxs associated with the pain

## 2014-10-04 LAB — I-STAT TROPONIN, ED: Troponin i, poc: 0 ng/mL (ref 0.00–0.08)

## 2015-01-21 ENCOUNTER — Emergency Department (HOSPITAL_COMMUNITY)
Admission: EM | Admit: 2015-01-21 | Discharge: 2015-01-22 | Disposition: A | Discharge status: Home / Self Care | Payer: Medicare Other | Attending: Emergency Medicine | Admitting: Emergency Medicine

## 2015-01-21 ENCOUNTER — Encounter (HOSPITAL_COMMUNITY): Payer: Self-pay | Admitting: Emergency Medicine

## 2015-01-21 DIAGNOSIS — R1013 Epigastric pain: Secondary | ICD-10-CM

## 2015-01-21 DIAGNOSIS — Z7982 Long term (current) use of aspirin: Secondary | ICD-10-CM | POA: Insufficient documentation

## 2015-01-21 DIAGNOSIS — I10 Essential (primary) hypertension: Secondary | ICD-10-CM | POA: Diagnosis not present

## 2015-01-21 DIAGNOSIS — Z79899 Other long term (current) drug therapy: Secondary | ICD-10-CM | POA: Diagnosis not present

## 2015-01-21 DIAGNOSIS — M1990 Unspecified osteoarthritis, unspecified site: Secondary | ICD-10-CM | POA: Insufficient documentation

## 2015-01-21 DIAGNOSIS — Z87891 Personal history of nicotine dependence: Secondary | ICD-10-CM | POA: Insufficient documentation

## 2015-01-21 DIAGNOSIS — N289 Disorder of kidney and ureter, unspecified: Secondary | ICD-10-CM | POA: Insufficient documentation

## 2015-01-21 DIAGNOSIS — D649 Anemia, unspecified: Secondary | ICD-10-CM | POA: Insufficient documentation

## 2015-01-21 LAB — COMPREHENSIVE METABOLIC PANEL
ALT: 8 U/L (ref 0–35)
AST: 26 U/L (ref 0–37)
Albumin: 4.1 g/dL (ref 3.5–5.2)
Alkaline Phosphatase: 100 U/L (ref 39–117)
Anion gap: 8 (ref 5–15)
BUN: 25 mg/dL — ABNORMAL HIGH (ref 6–23)
CALCIUM: 9.2 mg/dL (ref 8.4–10.5)
CO2: 25 mmol/L (ref 19–32)
Chloride: 103 mmol/L (ref 96–112)
Creatinine, Ser: 1.48 mg/dL — ABNORMAL HIGH (ref 0.50–1.10)
GFR, EST AFRICAN AMERICAN: 35 mL/min — AB (ref >90)
GFR, EST NON AFRICAN AMERICAN: 31 mL/min — AB (ref >90)
Glucose, Bld: 98 mg/dL (ref 70–99)
POTASSIUM: 3.7 mmol/L (ref 3.5–5.1)
Sodium: 136 mmol/L (ref 135–145)
TOTAL PROTEIN: 8 g/dL (ref 6.0–8.3)
Total Bilirubin: 0.5 mg/dL (ref 0.3–1.2)

## 2015-01-21 LAB — CBC WITH DIFFERENTIAL/PLATELET
BASOS ABS: 0.1 10*3/uL (ref 0.0–0.1)
BASOS PCT: 1 % (ref 0–1)
EOS ABS: 0.3 10*3/uL (ref 0.0–0.7)
EOS PCT: 3 % (ref 0–5)
HEMATOCRIT: 35.7 % — AB (ref 36.0–46.0)
Hemoglobin: 11.1 g/dL — ABNORMAL LOW (ref 12.0–15.0)
Lymphocytes Relative: 19 % (ref 12–46)
Lymphs Abs: 1.5 10*3/uL (ref 0.7–4.0)
MCH: 25.8 pg — AB (ref 26.0–34.0)
MCHC: 31.1 g/dL (ref 30.0–36.0)
MCV: 83 fL (ref 78.0–100.0)
MONO ABS: 0.7 10*3/uL (ref 0.1–1.0)
Monocytes Relative: 9 % (ref 3–12)
Neutro Abs: 5.6 10*3/uL (ref 1.7–7.7)
Neutrophils Relative %: 68 % (ref 43–77)
Platelets: 355 10*3/uL (ref 150–400)
RBC: 4.3 MIL/uL (ref 3.87–5.11)
RDW: 15.3 % (ref 11.5–15.5)
WBC: 8.2 10*3/uL (ref 4.0–10.5)

## 2015-01-21 LAB — LIPASE, BLOOD: LIPASE: 31 U/L (ref 11–59)

## 2015-01-21 NOTE — ED Notes (Addendum)
Patient came in today with complaints of generalized abdominal pain and nausea.  Denies fever, chills, vomiting, diarrhea.  Patient explains that her abdomen aches intermittently. Patient cannot describe if anything makes the pain worse or better. Patient denies taking any OTC pain medication to help.  Denies bloating, her last bowel movement was today.

## 2015-01-22 DIAGNOSIS — R1013 Epigastric pain: Secondary | ICD-10-CM | POA: Diagnosis not present

## 2015-01-22 MED ORDER — PANTOPRAZOLE SODIUM 40 MG PO TBEC
40.0000 mg | DELAYED_RELEASE_TABLET | Freq: Once | ORAL | Status: AC
  Administered 2015-01-22: 40 mg via ORAL
  Filled 2015-01-22: qty 1

## 2015-01-22 MED ORDER — GI COCKTAIL ~~LOC~~
30.0000 mL | Freq: Once | ORAL | Status: AC
  Administered 2015-01-22: 30 mL via ORAL
  Filled 2015-01-22: qty 30

## 2015-01-22 MED ORDER — PANTOPRAZOLE SODIUM 40 MG PO TBEC: 40.0000 mg | DELAYED_RELEASE_TABLET | Freq: Every day | ORAL | Status: DC

## 2015-01-22 NOTE — Discharge Instructions (Signed)
Abdominal Pain Many things can cause abdominal pain. Usually, abdominal pain is not caused by a disease and will improve without treatment. It can often be observed and treated at home. Your health care provider will do a physical exam and possibly order blood tests and X-rays to help determine the seriousness of your pain. However, in many cases, more time must pass before a clear cause of the pain can be found. Before that point, your health care provider may not know if you need more testing or further treatment. HOME CARE INSTRUCTIONS  Monitor your abdominal pain for any changes. The following actions may help to alleviate any discomfort you are experiencing:  Only take over-the-counter or prescription medicines as directed by your health care provider.  Do not take laxatives unless directed to do so by your health care provider.  Try a clear liquid diet (broth, tea, or water) as directed by your health care provider. Slowly move to a bland diet as tolerated. SEEK MEDICAL CARE IF:  You have unexplained abdominal pain.  You have abdominal pain associated with nausea or diarrhea.  You have pain when you urinate or have a bowel movement.  You experience abdominal pain that wakes you in the night.  You have abdominal pain that is worsened or improved by eating food.  You have abdominal pain that is worsened with eating fatty foods.  You have a fever. SEEK IMMEDIATE MEDICAL CARE IF:   Your pain does not go away within 2 hours.  You keep throwing up (vomiting).  Your pain is felt only in portions of the abdomen, such as the right side or the left lower portion of the abdomen.  You pass bloody or black tarry stools. MAKE SURE YOU:  Understand these instructions.   Will watch your condition.   Will get help right away if you are not doing well or get worse.  Document Released: 08/01/2005 Document Revised: 10/27/2013 Document Reviewed: 07/01/2013 [facility] Patient Information  2015 [place], [place]. This information is not intended to replace advice given to you by your health care provider. Make sure you discuss any questions you have with your health care provider.  Pantoprazole tablets What is this medicine? PANTOPRAZOLE (pan TOE pra zole) prevents the production of acid in the stomach. It is used to treat gastroesophageal reflux disease (GERD), inflammation of the esophagus, and Zollinger-Ellison syndrome. This medicine may be used for other purposes; ask your health care provider or pharmacist if you have questions. COMMON BRAND NAME(S): Protonix What should I tell my health care provider before I take this medicine? They need to know if you have any of these conditions: -liver disease -low levels of magnesium in the blood -an unusual or allergic reaction to omeprazole, lansoprazole, pantoprazole, rabeprazole, other medicines, foods, dyes, or preservatives -pregnant or trying to get pregnant -breast-feeding How should I use this medicine? Take this medicine by mouth. Swallow the tablets whole with a drink of water. Follow the directions on the prescription label. Do not crush, break, or chew. Take your medicine at regular intervals. Do not take your medicine more often than directed. Talk to your pediatrician regarding the use of this medicine in children. While this drug may be prescribed for children as young as 5 years for selected conditions, precautions do apply. Overdosage: If you think you have taken too much of this medicine contact a poison control center or emergency room at once. NOTE: This medicine is only for you. Do not share this medicine with others.  What if I miss a dose? If you miss a dose, take it as soon as you can. If it is almost time for your next dose, take only that dose. Do not take double or extra doses. What may interact with this medicine? Do not take this medicine with any of the following medications: -atazanavir -nelfinavir This  medicine may also interact with the following medications: -ampicillin -delavirdine -digoxin -diuretics -iron salts -medicines for fungal infections like ketoconazole, itraconazole and voriconazole -warfarin This list may not describe all possible interactions. Give your health care provider a list of all the medicines, herbs, non-prescription drugs, or dietary supplements you use. Also tell them if you smoke, drink alcohol, or use illegal drugs. Some items may interact with your medicine. What should I watch for while using this medicine? It can take several days before your stomach pain gets better. Check with your doctor or health care professional if your condition does not start to get better, or if it gets worse. You may need blood work done while you are taking this medicine. What side effects may I notice from receiving this medicine? Side effects that you should report to your doctor or health care professional as soon as possible: -allergic reactions like skin rash, itching or hives, swelling of the face, lips, or tongue -bone, muscle or joint pain -breathing problems -chest pain or chest tightness -dark yellow or brown urine -dizziness -fast, irregular heartbeat -feeling faint or lightheaded -fever or sore throat -muscle spasm -palpitations -redness, blistering, peeling or loosening of the skin, including inside the mouth -seizures -tremors -unusual bleeding or bruising -unusually weak or tired -yellowing of the eyes or skin Side effects that usually do not require medical attention (Report these to your doctor or health care professional if they continue or are bothersome.): -constipation -diarrhea -dry mouth -headache -nausea This list may not describe all possible side effects. Call your doctor for medical advice about side effects. You may report side effects to FDA at 1-800-FDA-1088. Where should I keep my medicine? Keep out of the reach of children. Store at  room temperature between 15 and 30 degrees C (59 and 86 degrees F). Protect from light and moisture. Throw away any unused medicine after the expiration date. NOTE: This sheet is a summary. It may not cover all possible information. If you have questions about this medicine, talk to your doctor, pharmacist, or health care provider.  2015, [employer]. (2012-08-20 16:40:16)

## 2015-01-22 NOTE — ED Provider Notes (Signed)
CSN: [id number]     Arrival date & time 01/21/15  2041 History   First MD Initiated Contact with Patient 01/22/15 0029     Chief Complaint  Patient presents with  . Abdominal Pain  . Nausea     (Consider location/radiation/quality/duration/timing/severity/associated sxs/prior Treatment) Patient is a 81 y.o. female presenting with abdominal pain. The history is provided by the patient.  Abdominal Pain She complains of epigastric pain which radiated to the back. Pain started after eating breakfast and got worse after eating salad in the afternoon. She denies nausea, fever, chills, constipation, diarrhea. Nothing made the pain better nothing made it worse. She is unable to put a number on the pain but states that it is feeling somewhat better now than it had been earlier.  Past Medical History  Diagnosis Date  . RBBB (right bundle branch block) 11/20/2010    Echo - EF >55%; flow pattern suggestive of impaired LV relaxation, proximal septal thickening noted; mitral valve mildly thickened; mild mitral annular calcification; aortic root sclerosis/calcification  . Hypertension   . H/O diastolic dysfunction   . Heart palpitations   . Arthritis    History reviewed. No pertinent past surgical history. History reviewed. No pertinent family history. History  Substance Use Topics  . Smoking status: Former Smoker    Quit date: 11/05/1982  . Smokeless tobacco: Not on file  . Alcohol Use: No   OB History    No data available     Review of Systems  Gastrointestinal: Positive for abdominal pain.  All other systems reviewed and are negative.     Allergies  Review of patient's allergies indicates no known allergies.  Home Medications   Prior to Admission medications   Medication Sig Start Date End Date Taking? Authorizing Provider  amLODipine (NORVASC) 10 MG tablet Take 1 tablet by mouth daily. 07/06/14   Historical Provider, MD  aspirin 81 MG tablet Take 81 mg by mouth daily.     Historical Provider, MD  furosemide (LASIX) 20 MG tablet Take 1 tablet by mouth daily. 07/06/14   Historical Provider, MD  HYDROcodone-acetaminophen (NORCO/VICODIN) 5-325 MG per tablet Take 1 tablet by mouth every 4 (four) hours as needed for moderate pain. Patient not taking: Reported on 09/30/2014 02/13/14   Jola Schmidt, MD   BP 158/49 mmHg  Pulse 89  Temp(Src) 98.1 F (36.7 C) (Oral)  Resp 20  SpO2 100% Physical Exam  Nursing note and vitals reviewed.  81 year old female, resting comfortably and in no acute distress. Vital signs are significant for hypertension. Oxygen saturation is 100%, which is normal. Head is normocephalic and atraumatic. PERRLA, EOMI. Oropharynx is clear. Neck is nontender and supple without adenopathy or JVD. Back is nontender and there is no CVA tenderness. Lungs are clear without rales, wheezes, or rhonchi. Chest is nontender. Heart has regular rate and rhythm with occasional premature beats. There is no murmur. Abdomen is soft, flat, with mild epigastric tenderness. There is no rebound or guarding. There are no masses or hepatosplenomegaly and peristalsis is hypoactive. Extremities have no cyanosis or edema, full range of motion is present. Skin is warm and dry without rash. Neurologic: Mental status is normal, cranial nerves are intact, there are no motor or sensory deficits.  ED Course  Procedures (including critical care time) Labs Review Results for orders placed or performed during the hospital encounter of 01/21/15  CBC with Differential  Result Value Ref Range   WBC 8.2 4.0 - 10.5 K/uL  RBC 4.30 3.87 - 5.11 MIL/uL   Hemoglobin 11.1 (L) 12.0 - 15.0 g/dL   HCT 35.7 (L) 36.0 - 46.0 %   MCV 83.0 78.0 - 100.0 fL   MCH 25.8 (L) 26.0 - 34.0 pg   MCHC 31.1 30.0 - 36.0 g/dL   RDW 15.3 11.5 - 15.5 %   Platelets 355 150 - 400 K/uL   Neutrophils Relative % 68 43 - 77 %   Neutro Abs 5.6 1.7 - 7.7 K/uL   Lymphocytes Relative 19 12 - 46 %   Lymphs Abs  1.5 0.7 - 4.0 K/uL   Monocytes Relative 9 3 - 12 %   Monocytes Absolute 0.7 0.1 - 1.0 K/uL   Eosinophils Relative 3 0 - 5 %   Eosinophils Absolute 0.3 0.0 - 0.7 K/uL   Basophils Relative 1 0 - 1 %   Basophils Absolute 0.1 0.0 - 0.1 K/uL  Comprehensive metabolic panel  Result Value Ref Range   Sodium 136 135 - 145 mmol/L   Potassium 3.7 3.5 - 5.1 mmol/L   Chloride 103 96 - 112 mmol/L   CO2 25 19 - 32 mmol/L   Glucose, Bld 98 70 - 99 mg/dL   BUN 25 (H) 6 - 23 mg/dL   Creatinine, Ser 1.48 (H) 0.50 - 1.10 mg/dL   Calcium 9.2 8.4 - 10.5 mg/dL   Total Protein 8.0 6.0 - 8.3 g/dL   Albumin 4.1 3.5 - 5.2 g/dL   AST 26 0 - 37 U/L   ALT 8 0 - 35 U/L   Alkaline Phosphatase 100 39 - 117 U/L   Total Bilirubin 0.5 0.3 - 1.2 mg/dL   GFR calc non Af Amer 31 (L) >90 mL/min   GFR calc Af Amer 35 (L) >90 mL/min   Anion gap 8 5 - 15  Lipase, blood  Result Value Ref Range   Lipase 31 11 - 59 U/L    EKG Interpretation   Date/Time:  Saturday January 22 2015 00:19:29 EDT Ventricular Rate:  79 PR Interval:  190 QRS Duration: 155 QT Interval:  428 QTC Calculation: 491 R Axis:   -70 Text Interpretation:  Sinus or ectopic atrial rhythm Multiform ventricular  premature complexes Right bundle branch block When compared with ECG of  09/30/2014, Premature ventricular complexes are no longer Present  Confirmed by [facility]  MD, Shaman Muscarella ([id number]) on 01/22/2015 12:30:58 AM      MDM   Final diagnoses:  Epigastric pain  Renal insufficiency  Normochromic normocytic anemia    Epigastric pain which got worse after meals on 2 occasions. She will be given a therapeutic valve a GI cocktail and pantoprazole. Old records are reviewed and she did have a CT of her abdomen and pelvis 1 year ago which showed no significant pathology.  She feels much better after above noted treatment. Laboratory workup shows stable anemia and stable renal insufficiency. She is discharged with prescription for pantoprazole.  Delora Fuel, MD [phone] [phone]

## 2015-01-24 ENCOUNTER — Emergency Department (HOSPITAL_COMMUNITY): Payer: Medicare Other

## 2015-01-24 ENCOUNTER — Emergency Department (HOSPITAL_COMMUNITY)
Admission: EM | Admit: 2015-01-24 | Discharge: 2015-01-24 | Disposition: A | Discharge status: Home / Self Care | Payer: Medicare Other | Attending: Emergency Medicine | Admitting: Emergency Medicine

## 2015-01-24 ENCOUNTER — Encounter (HOSPITAL_COMMUNITY): Payer: Self-pay

## 2015-01-24 DIAGNOSIS — R1032 Left lower quadrant pain: Secondary | ICD-10-CM | POA: Insufficient documentation

## 2015-01-24 DIAGNOSIS — R197 Diarrhea, unspecified: Secondary | ICD-10-CM | POA: Diagnosis not present

## 2015-01-24 DIAGNOSIS — Z79899 Other long term (current) drug therapy: Secondary | ICD-10-CM | POA: Diagnosis not present

## 2015-01-24 DIAGNOSIS — R109 Unspecified abdominal pain: Secondary | ICD-10-CM | POA: Diagnosis present

## 2015-01-24 DIAGNOSIS — M1990 Unspecified osteoarthritis, unspecified site: Secondary | ICD-10-CM | POA: Insufficient documentation

## 2015-01-24 DIAGNOSIS — I5030 Unspecified diastolic (congestive) heart failure: Secondary | ICD-10-CM | POA: Insufficient documentation

## 2015-01-24 DIAGNOSIS — R1013 Epigastric pain: Secondary | ICD-10-CM | POA: Insufficient documentation

## 2015-01-24 DIAGNOSIS — Z7982 Long term (current) use of aspirin: Secondary | ICD-10-CM | POA: Diagnosis not present

## 2015-01-24 DIAGNOSIS — R112 Nausea with vomiting, unspecified: Secondary | ICD-10-CM | POA: Insufficient documentation

## 2015-01-24 DIAGNOSIS — I10 Essential (primary) hypertension: Secondary | ICD-10-CM | POA: Diagnosis not present

## 2015-01-24 DIAGNOSIS — Z87891 Personal history of nicotine dependence: Secondary | ICD-10-CM | POA: Insufficient documentation

## 2015-01-24 LAB — COMPREHENSIVE METABOLIC PANEL
ALBUMIN: 3.7 g/dL (ref 3.5–5.2)
ALT: 7 U/L (ref 0–35)
AST: 20 U/L (ref 0–37)
Alkaline Phosphatase: 85 U/L (ref 39–117)
Anion gap: 14 (ref 5–15)
BUN: 30 mg/dL — ABNORMAL HIGH (ref 6–23)
CALCIUM: 9.3 mg/dL (ref 8.4–10.5)
CO2: 25 mmol/L (ref 19–32)
CREATININE: 1.74 mg/dL — AB (ref 0.50–1.10)
Chloride: 97 mmol/L (ref 96–112)
GFR calc Af Amer: 29 mL/min — ABNORMAL LOW (ref >90)
GFR calc non Af Amer: 25 mL/min — ABNORMAL LOW (ref >90)
Glucose, Bld: 105 mg/dL — ABNORMAL HIGH (ref 70–99)
Potassium: 3.4 mmol/L — ABNORMAL LOW (ref 3.5–5.1)
Sodium: 136 mmol/L (ref 135–145)
TOTAL PROTEIN: 7.3 g/dL (ref 6.0–8.3)
Total Bilirubin: 0.8 mg/dL (ref 0.3–1.2)

## 2015-01-24 LAB — URINALYSIS, ROUTINE W REFLEX MICROSCOPIC
Bilirubin Urine: NEGATIVE
GLUCOSE, UA: NEGATIVE mg/dL
Hgb urine dipstick: NEGATIVE
Ketones, ur: NEGATIVE mg/dL
Nitrite: NEGATIVE
PROTEIN: 30 mg/dL — AB
Specific Gravity, Urine: 1.019 (ref 1.005–1.030)
UROBILINOGEN UA: 0.2 mg/dL (ref 0.0–1.0)
pH: 5.5 (ref 5.0–8.0)

## 2015-01-24 LAB — CBC WITH DIFFERENTIAL/PLATELET
BASOS PCT: 0 % (ref 0–1)
Basophils Absolute: 0 10*3/uL (ref 0.0–0.1)
Eosinophils Absolute: 0.1 10*3/uL (ref 0.0–0.7)
Eosinophils Relative: 1 % (ref 0–5)
HCT: 36.1 % (ref 36.0–46.0)
Hemoglobin: 11.4 g/dL — ABNORMAL LOW (ref 12.0–15.0)
LYMPHS ABS: 0.3 10*3/uL — AB (ref 0.7–4.0)
Lymphocytes Relative: 3 % — ABNORMAL LOW (ref 12–46)
MCH: 26 pg (ref 26.0–34.0)
MCHC: 31.6 g/dL (ref 30.0–36.0)
MCV: 82.2 fL (ref 78.0–100.0)
Monocytes Absolute: 0.3 10*3/uL (ref 0.1–1.0)
Monocytes Relative: 3 % (ref 3–12)
NEUTROS ABS: 10.2 10*3/uL — AB (ref 1.7–7.7)
NEUTROS PCT: 93 % — AB (ref 43–77)
Platelets: 363 10*3/uL (ref 150–400)
RBC: 4.39 MIL/uL (ref 3.87–5.11)
RDW: 14.8 % (ref 11.5–15.5)
WBC: 10.9 10*3/uL — AB (ref 4.0–10.5)

## 2015-01-24 LAB — URINE MICROSCOPIC-ADD ON

## 2015-01-24 LAB — LIPASE, BLOOD: LIPASE: 30 U/L (ref 11–59)

## 2015-01-24 MED ORDER — ONDANSETRON HCL 4 MG/2ML IJ SOLN
4.0000 mg | Freq: Once | INTRAMUSCULAR | Status: AC
  Administered 2015-01-24: 4 mg via INTRAVENOUS
  Filled 2015-01-24: qty 2

## 2015-01-24 MED ORDER — ONDANSETRON HCL 4 MG PO TABS: 4.0000 mg | ORAL_TABLET | Freq: Four times a day (QID) | ORAL | Status: DC | PRN

## 2015-01-24 MED ORDER — IOHEXOL 300 MG/ML  SOLN
50.0000 mL | Freq: Once | INTRAMUSCULAR | Status: AC | PRN
  Administered 2015-01-24: 50 mL via ORAL

## 2015-01-24 MED ORDER — FENTANYL CITRATE 0.05 MG/ML IJ SOLN
50.0000 ug | Freq: Once | INTRAMUSCULAR | Status: AC
  Administered 2015-01-24: 50 ug via INTRAVENOUS
  Filled 2015-01-24: qty 2

## 2015-01-24 NOTE — Discharge Instructions (Signed)
Abdominal Pain, Women °Abdominal (stomach, pelvic, or belly) pain can be caused by many things. It is important to tell your doctor: °· The location of the pain. °· Does it come and go or is it present all the time? °· Are there things that start the pain (eating certain foods, exercise)? °· Are there other symptoms associated with the pain (fever, nausea, vomiting, diarrhea)? °All of this is helpful to know when trying to find the cause of the pain. °CAUSES  °· Stomach: virus or bacteria infection, or ulcer. °· Intestine: appendicitis (inflamed appendix), regional ileitis (Crohn's disease), ulcerative colitis (inflamed colon), irritable bowel syndrome, diverticulitis (inflamed diverticulum of the colon), or cancer of the stomach or intestine. °· Gallbladder disease or stones in the gallbladder. °· Kidney disease, kidney stones, or infection. °· Pancreas infection or cancer. °· Fibromyalgia (pain disorder). °· Diseases of the female organs: °¨ Uterus: fibroid (non-cancerous) tumors or infection. °¨ Fallopian tubes: infection or tubal pregnancy. °¨ Ovary: cysts or tumors. °¨ Pelvic adhesions (scar tissue). °¨ Endometriosis (uterus lining tissue growing in the pelvis and on the pelvic organs). °¨ Pelvic congestion syndrome (female organs filling up with blood just before the menstrual period). °¨ Pain with the menstrual period. °¨ Pain with ovulation (producing an egg). °¨ Pain with an IUD (intrauterine device, birth control) in the uterus. °¨ Cancer of the female organs. °· Functional pain (pain not caused by a disease, may improve without treatment). °· Psychological pain. °· Depression. °DIAGNOSIS  °Your doctor will decide the seriousness of your pain by doing an examination. °· Blood tests. °· X-rays. °· Ultrasound. °· CT scan (computed tomography, special type of X-ray). °· MRI (magnetic resonance imaging). °· Cultures, for infection. °· Barium enema (dye inserted in the large intestine, to better view it with  X-rays). °· Colonoscopy (looking in intestine with a lighted tube). °· Laparoscopy (minor surgery, looking in abdomen with a lighted tube). °· Major abdominal exploratory surgery (looking in abdomen with a large incision). °TREATMENT  °The treatment will depend on the cause of the pain.  °· Many cases can be observed and treated at home. °· Over-the-counter medicines recommended by your caregiver. °· Prescription medicine. °· Antibiotics, for infection. °· Birth control pills, for painful periods or for ovulation pain. °· Hormone treatment, for endometriosis. °· Nerve blocking injections. °· Physical therapy. °· Antidepressants. °· Counseling with a psychologist or psychiatrist. °· Minor or major surgery. °HOME CARE INSTRUCTIONS  °· Do not take laxatives, unless directed by your caregiver. °· Take over-the-counter pain medicine only if ordered by your caregiver. Do not take aspirin because it can cause an upset stomach or bleeding. °· Try a clear liquid diet (broth or water) as ordered by your caregiver. Slowly move to a bland diet, as tolerated, if the pain is related to the stomach or intestine. °· Have a thermometer and take your temperature several times a day, and record it. °· Bed rest and sleep, if it helps the pain. °· Avoid sexual intercourse, if it causes pain. °· Avoid stressful situations. °· Keep your follow-up appointments and tests, as your caregiver orders. °· If the pain does not go away with medicine or surgery, you may try: °¨ Acupuncture. °¨ Relaxation exercises (yoga, meditation). °¨ Group therapy. °¨ Counseling. °SEEK MEDICAL CARE IF:  °· You notice certain foods cause stomach pain. °· Your home care treatment is not helping your pain. °· You need stronger pain medicine. °· You want your IUD removed. °· You feel faint or   lightheaded. °· You develop nausea and vomiting. °· You develop a rash. °· You are having side effects or an allergy to your medicine. °SEEK IMMEDIATE MEDICAL CARE IF:  °· Your  pain does not go away or gets worse. °· You have a fever. °· Your pain is felt only in portions of the abdomen. The right side could possibly be appendicitis. The left lower portion of the abdomen could be colitis or diverticulitis. °· You are passing blood in your stools (bright red or black tarry stools, with or without vomiting). °· You have blood in your urine. °· You develop chills, with or without a fever. °· You pass out. °MAKE SURE YOU:  °· Understand these instructions. °· Will watch your condition. °· Will get help right away if you are not doing well or get worse. °Document Released: 08/19/2007 Document Revised: 03/08/2014 Document Reviewed: 09/08/2009 °[employer]® Patient Information ©2015 [employer]. This information is not intended to replace advice given to you by your health care provider. Make sure you discuss any questions you have with your health care provider. ° °

## 2015-01-24 NOTE — ED Provider Notes (Signed)
CT with no acute changes.  Patient states she is feeling improved.  She denies recent urinary symptoms.  We'll by mouth challenge.  Given history of nausea, vomiting, loose stool and low-grade temperature.  Suspect viral gastroenteritis.  Doubt acute surgical cause of pain such as small bowel obstruction, appendicitis, cholecystitis, given lack of symptoms and lack of CT findings.  Pt PO challeneged prior to DC< will be sent home with zofran.   1. Abdominal pain      Shannon Patches, MD 01/24/15 1027

## 2015-01-24 NOTE — ED Notes (Addendum)
Awake. Verbally responsive. Resp even and unlabored. No audible adventitious breath sounds noted. ABC's intact. Abd soft/nondistended but tender to palpate. BS (+) and active x4 quadrants. No N/V/D reported. 

## 2015-01-24 NOTE — ED Notes (Signed)
Awake. Verbally responsive. Resp even and unlabored. No audible adventitious breath sounds noted. ABC's intact. Abd soft/nondistended but tender to palpate. BS (+) and active x4 quadrants. No N/V/D reported. IV saline lock patent and intact. 

## 2015-01-24 NOTE — ED Provider Notes (Signed)
CSN: [id number]     Arrival date & time 01/24/15  0424 History   First MD Initiated Contact with Patient 01/24/15 [phone]     Chief Complaint  Patient presents with  . Abdominal Pain     (Consider location/radiation/quality/duration/timing/severity/associated sxs/prior Treatment) HPI 81 year old female presents to the emergency department with complaint of persistent abdominal pain, now with nausea, vomiting and diarrhea.  Patient was seen in the emergency department on Friday evening and had epigastric pain.  Patient reports she was given a new medication which she has been taking after meals.  On Sunday around 2 PM after taking the medication.  She decided to have a snack later on the afternoon and then began to have worsening abdominal pain started in her upper abdomen and then radiated downward.  Patient reports she developed nausea, vomiting and diarrhea.  Soon afterward.  When symptoms persisted, she called her son to bring her to the emergency department.  Patient reports that she has felt hot, like she has a fever.  She denies any urinary symptoms.  She denies any sick contacts.  Labs on prior visit were normal aside from mild anemia and slightly elevated BUN and creatinine.  Patient reports only prior surgeries are C-section.  She denies any specific pain in her right upper and right lower quadrant.  No prior history of diverticulitis. Past Medical History  Diagnosis Date  . RBBB (right bundle branch block) 11/20/2010    Echo - EF >55%; flow pattern suggestive of impaired LV relaxation, proximal septal thickening noted; mitral valve mildly thickened; mild mitral annular calcification; aortic root sclerosis/calcification  . Hypertension   . H/O diastolic dysfunction   . Heart palpitations   . Arthritis    History reviewed. No pertinent past surgical history. History reviewed. No pertinent family history. History  Substance Use Topics  . Smoking status: Former Smoker    Quit date:  11/05/1982  . Smokeless tobacco: Not on file  . Alcohol Use: No   OB History    No data available     Review of Systems  See History of Present Illness; otherwise all other systems are reviewed and negative   Allergies  Review of patient's allergies indicates no known allergies.  Home Medications   Prior to Admission medications   Medication Sig Start Date End Date Taking? Authorizing Provider  amLODipine (NORVASC) 10 MG tablet Take 1 tablet by mouth daily. 07/06/14  Yes Historical Provider, MD  aspirin 81 MG tablet Take 81 mg by mouth daily.   Yes Historical Provider, MD  furosemide (LASIX) 20 MG tablet Take 1 tablet by mouth daily. 07/06/14  Yes Historical Provider, MD  pantoprazole (PROTONIX) 40 MG tablet Take 1 tablet (40 mg total) by mouth daily. 0000000  Yes Delora Fuel, MD  HYDROcodone-acetaminophen (NORCO/VICODIN) 5-325 MG per tablet Take 1 tablet by mouth every 4 (four) hours as needed for moderate pain. Patient not taking: Reported on 09/30/2014 02/13/14   Jola Schmidt, MD   BP 146/55 mmHg  Pulse 92  Temp(Src) 99.6 F (37.6 C) (Oral)  Resp 20  SpO2 93% Physical Exam  Constitutional: She is oriented to person, place, and time. She appears well-developed and well-nourished.  HENT:  Head: Normocephalic and atraumatic.  Nose: Nose normal.  Mouth/Throat: Oropharynx is clear and moist.  Eyes: Conjunctivae and EOM are normal. Pupils are equal, round, and reactive to light.  Neck: Normal range of motion. Neck supple. No JVD present. No tracheal deviation present. No thyromegaly present.  Cardiovascular:  Normal rate, regular rhythm, normal heart sounds and intact distal pulses.  Exam reveals no gallop and no friction rub.   No murmur heard. Pulmonary/Chest: Effort normal and breath sounds normal. No stridor. No respiratory distress. She has no wheezes. She has no rales. She exhibits no tenderness.  Abdominal: Soft. She exhibits no distension and no mass. There is tenderness  (patient has diffuse tenderness worse in epigastrium and left lower quadrant.  She has prominent aortic pulsing in her upper abdomen). There is no rebound and no guarding.  Hyperactive bowel sounds  Musculoskeletal: Normal range of motion. She exhibits no edema or tenderness.  Lymphadenopathy:    She has no cervical adenopathy.  Neurological: She is alert and oriented to person, place, and time. She displays normal reflexes. She exhibits normal muscle tone. Coordination normal.  Skin: Skin is warm and dry. No rash noted. No erythema. No pallor.  Psychiatric: She has a normal mood and affect. Her behavior is normal. Judgment and thought content normal.  Nursing note and vitals reviewed.   ED Course  Procedures (including critical care time) Labs Review Labs Reviewed  COMPREHENSIVE METABOLIC PANEL  LIPASE, BLOOD  CBC WITH DIFFERENTIAL/PLATELET  URINALYSIS, ROUTINE W REFLEX MICROSCOPIC    Imaging Review No results found.   EKG Interpretation None      MDM   Final diagnoses:  None   81 year old female with persistent abdominal pain with nausea, vomiting and diarrhea as new symptoms.  Plan for labs, urinalysis and CT abdomen pelvis.  Patient has somewhat prominent pulsations of her aorta without a true pulsatile mass, as she had a normal CT scan a year ago.  I doubt acute aortic aneurysm    Linton Flemings, MD 01/24/15 [phone]

## 2015-01-24 NOTE — ED Notes (Signed)
Pt complains of abdominal pain, was seen for the same last week and took the prescribed medications, with no relief

## 2015-01-24 NOTE — ED Notes (Signed)
Tolerated cran-grape juice. No nausea or vomiting at this time.

## 2015-01-24 NOTE — ED Notes (Signed)
Awake. Verbally responsive. Resp even and unlabored. No audible adventitious breath sounds noted. ABC's intact. Abd soft/nondistended but tender to palpate. BS (+) and active x4 quadrants. Pt reported n/v x2 in past 24 hrs and denies diarrhea.

## 2015-01-25 LAB — URINE CULTURE
Colony Count: NO GROWTH
Culture: NO GROWTH
Special Requests: NORMAL

## 2015-01-31 ENCOUNTER — Encounter: Payer: Self-pay | Admitting: Gastroenterology

## 2015-02-10 ENCOUNTER — Ambulatory Visit: Payer: Medicare Other | Admitting: Gastroenterology

## 2015-07-18 ENCOUNTER — Emergency Department (HOSPITAL_COMMUNITY)
Admission: EM | Admit: 2015-07-18 | Discharge: 2015-07-18 | Disposition: A | Discharge status: Home / Self Care | Payer: Medicare Other | Attending: Emergency Medicine | Admitting: Emergency Medicine

## 2015-07-18 ENCOUNTER — Encounter (HOSPITAL_COMMUNITY): Payer: Self-pay | Admitting: Emergency Medicine

## 2015-07-18 ENCOUNTER — Emergency Department (HOSPITAL_COMMUNITY): Payer: Medicare Other

## 2015-07-18 DIAGNOSIS — M1990 Unspecified osteoarthritis, unspecified site: Secondary | ICD-10-CM | POA: Insufficient documentation

## 2015-07-18 DIAGNOSIS — Z7982 Long term (current) use of aspirin: Secondary | ICD-10-CM | POA: Diagnosis not present

## 2015-07-18 DIAGNOSIS — Z79899 Other long term (current) drug therapy: Secondary | ICD-10-CM | POA: Insufficient documentation

## 2015-07-18 DIAGNOSIS — I10 Essential (primary) hypertension: Secondary | ICD-10-CM | POA: Insufficient documentation

## 2015-07-18 DIAGNOSIS — Z87891 Personal history of nicotine dependence: Secondary | ICD-10-CM | POA: Insufficient documentation

## 2015-07-18 DIAGNOSIS — R509 Fever, unspecified: Secondary | ICD-10-CM | POA: Diagnosis not present

## 2015-07-18 DIAGNOSIS — R51 Headache: Secondary | ICD-10-CM | POA: Diagnosis present

## 2015-07-18 DIAGNOSIS — R519 Headache, unspecified: Secondary | ICD-10-CM

## 2015-07-18 LAB — I-STAT CHEM 8, ED
BUN: 24 mg/dL — AB (ref 6–20)
CALCIUM ION: 1.19 mmol/L (ref 1.13–1.30)
CHLORIDE: 104 mmol/L (ref 101–111)
Creatinine, Ser: 1.5 mg/dL — ABNORMAL HIGH (ref 0.44–1.00)
Glucose, Bld: 106 mg/dL — ABNORMAL HIGH (ref 65–99)
HCT: 36 % (ref 36.0–46.0)
Hemoglobin: 12.2 g/dL (ref 12.0–15.0)
Potassium: 3.5 mmol/L (ref 3.5–5.1)
SODIUM: 138 mmol/L (ref 135–145)
TCO2: 23 mmol/L (ref 0–100)

## 2015-07-18 MED ORDER — ACETAMINOPHEN 325 MG PO TABS
650.0000 mg | ORAL_TABLET | Freq: Once | ORAL | Status: AC
  Administered 2015-07-18: 650 mg via ORAL
  Filled 2015-07-18: qty 2

## 2015-07-18 NOTE — Discharge Instructions (Signed)

## 2015-07-18 NOTE — ED Notes (Signed)
Bed: WA02 Expected date:  Expected time:  Means of arrival:  Comments: 81 yo HA

## 2015-07-18 NOTE — ED Notes (Signed)
Pt comes in today with EMS from home with a c/o headache that started around 0800 today. Pt has no hx of HA, but does have a hx of HTN. BP is 111/69. Pt denies N/V/D light headedness or dizziness.

## 2015-07-18 NOTE — ED Provider Notes (Signed)
CSN: [id number]     Arrival date & time 07/18/15  1652 History   First MD Initiated Contact with Patient 07/18/15 1654     Chief Complaint  Patient presents with  . Headache     (Consider location/radiation/quality/duration/timing/severity/associated sxs/prior Treatment) Patient is a 81 y.o. female presenting with headaches. The history is provided by the patient.  Headache Location: vertex. Quality:  Dull Radiates to:  Does not radiate Severity currently:  0/10 Severity at highest:  5/10 Onset quality:  Gradual Duration:  12 hours Timing:  Constant Progression:  Unchanged Chronicity:  New Similar to prior headaches: no   Context: activity (worse with standing and walking)   Relieved by:  Nothing Worsened by:  Nothing Ineffective treatments:  None tried Associated symptoms: fever (subjective x 1)   Associated symptoms: no congestion, no cough, no dizziness, no myalgias, no nausea, no URI and no vomiting    80 yo F with a chief complaint of headache. Patient states never had a headache previously. States is been going on since this morning slow onset pain to the top of her head. This is worse when she gets up and moves around. Patient normally walks with a walker secondary to her arthritis of bilateral knees. Patient states that her gait has been somewhat more unsteady today then has been previously. Upon arrival patient symptoms have completely resolved. Patient states that she had one episode of a somewhat hot flash denies other fevers chills. Denies cough congestion dysuria.  Past Medical History  Diagnosis Date  . RBBB (right bundle branch block) 11/20/2010    Echo - EF >55%; flow pattern suggestive of impaired LV relaxation, proximal septal thickening noted; mitral valve mildly thickened; mild mitral annular calcification; aortic root sclerosis/calcification  . Hypertension   . H/O diastolic dysfunction   . Heart palpitations   . Arthritis    History reviewed. No pertinent  past surgical history. History reviewed. No pertinent family history. Social History  Substance Use Topics  . Smoking status: Former Smoker    Quit date: 11/05/1982  . Smokeless tobacco: None  . Alcohol Use: No   OB History    No data available     Review of Systems  Constitutional: Positive for fever (subjective x 1). Negative for chills.  HENT: Negative for congestion and rhinorrhea.   Eyes: Negative for redness and visual disturbance.  Respiratory: Negative for cough, shortness of breath and wheezing.   Cardiovascular: Negative for chest pain and palpitations.  Gastrointestinal: Negative for nausea and vomiting.  Genitourinary: Negative for dysuria and urgency.  Musculoskeletal: Negative for myalgias and arthralgias.  Skin: Negative for pallor and wound.  Neurological: Positive for headaches. Negative for dizziness.      Allergies  Review of patient's allergies indicates no known allergies.  Home Medications   Prior to Admission medications   Medication Sig Start Date End Date Taking? Authorizing Provider  amLODipine (NORVASC) 10 MG tablet Take 1 tablet by mouth daily. 07/06/14  Yes Historical Provider, MD  aspirin 81 MG tablet Take 81 mg by mouth daily.   Yes Historical Provider, MD  furosemide (LASIX) 20 MG tablet Take 1 tablet by mouth daily. 07/06/14  Yes Historical Provider, MD  RESTASIS 0.05 % ophthalmic emulsion Place 1 drop into both eyes 2 (two) times daily. 07/14/15  Yes Historical Provider, MD   BP 134/55 mmHg  Pulse 79  Temp(Src) 98.6 F (37 C) (Oral)  Resp 18  Ht 5\' 5"  (1.651 m)  Wt 132 lb (59.875  kg)  BMI 21.97 kg/m2  SpO2 98% Physical Exam  Constitutional: She is oriented to person, place, and time. She appears well-developed and well-nourished. No distress.  HENT:  Head: Normocephalic and atraumatic.  Eyes: EOM are normal. Pupils are equal, round, and reactive to light.  Neck: Normal range of motion. Neck supple.  Cardiovascular: Normal rate and  regular rhythm.  Exam reveals no gallop and no friction rub.   No murmur heard. Pulmonary/Chest: Effort normal. She has no wheezes. She has no rales.  Abdominal: Soft. She exhibits no distension. There is no tenderness. There is no rebound and no guarding.  Musculoskeletal: She exhibits no edema or tenderness.  Neurological: She is alert and oriented to person, place, and time. She has normal strength. No cranial nerve deficit or sensory deficit. She displays a negative Romberg sign. Coordination and gait normal. GCS eye subscore is 4. GCS verbal subscore is 5. GCS motor subscore is 6. She displays no Babinski's sign on the right side. She displays no Babinski's sign on the left side.  Skin: Skin is warm and dry. She is not diaphoretic.  Psychiatric: She has a normal mood and affect. Her behavior is normal.    ED Course  Procedures (including critical care time) Labs Review Labs Reviewed  I-STAT CHEM 8, ED - Abnormal; Notable for the following:    BUN 24 (*)    Creatinine, Ser 1.50 (*)    Glucose, Bld 106 (*)    All other components within normal limits    Imaging Review Ct Head Wo Contrast  07/18/2015   CLINICAL DATA:  Headache which started around 8 a.m. History of hypertension.  EXAM: CT HEAD WITHOUT CONTRAST  TECHNIQUE: Contiguous axial images were obtained from the base of the skull through the vertex without intravenous contrast.  COMPARISON:  None  FINDINGS: There is mild periventricular white matter change consistent with small vessel disease. There is no intra or extra-axial fluid collection or mass lesion. The basilar cisterns and ventricles have a normal appearance. There is no CT evidence for acute infarction or hemorrhage. Bone windows show no calvarial fracture. There is mucoperiosteal thickening of the left ethmoid sinus, partially imaged. No evidence for acute sinusitis.  IMPRESSION: 1.  No evidence for acute intracranial abnormality. 2. Changes consistent with small vessel  disease. 3. Minimal changes of chronic sinusitis.   Electronically Signed   By: Nolon Nations M.D.   On: 07/18/2015 18:52   I have personally reviewed and evaluated these images and lab results as part of my medical decision-making.   EKG Interpretation None      MDM   Final diagnoses:  Acute intractable headache, unspecified headache type    81 yo F with a chief complaint of headache. No headaches previously. Will obtain a CT of head. I-STAT Chem-8. Patient symptoms are completely resolved while in the ED. Able to ambulate with my assistance without difficulty. Patient feels like her gait is at baseline.  CT head and laboratory evaluation unremarkable. Patient continues to be pain-free.   I have discussed the diagnosis/risks/treatment options with the patient and family and believe the pt to be eligible for discharge home to follow-up with PCP. We also discussed returning to the ED immediately if new or worsening sx occur. We discussed the sx which are most concerning (e.g., sudden worsening symptoms, neuro sx) that necessitate immediate return. Medications administered to the patient during their visit and any new prescriptions provided to the patient are listed below.  Medications  given during this visit Medications  acetaminophen (TYLENOL) tablet 650 mg (650 mg Oral Given 07/18/15 1817)    Discharge Medication List as of 07/18/2015  7:01 PM       The patient appears reasonably screen and/or stabilized for discharge and I doubt any other medical condition or other [facility] requiring further screening, evaluation, or treatment in the ED at this time prior to discharge.    Deno Etienne, DO 07/19/15 [id number]

## 2015-11-18 ENCOUNTER — Encounter (HOSPITAL_COMMUNITY): Payer: Self-pay | Admitting: Emergency Medicine

## 2015-11-18 ENCOUNTER — Emergency Department (HOSPITAL_COMMUNITY): Payer: Medicare Other

## 2015-11-18 ENCOUNTER — Emergency Department (HOSPITAL_COMMUNITY)
Admission: EM | Admit: 2015-11-18 | Discharge: 2015-11-19 | Disposition: A | Discharge status: Home / Self Care | Payer: Medicare Other | Attending: Emergency Medicine | Admitting: Emergency Medicine

## 2015-11-18 DIAGNOSIS — M545 Low back pain, unspecified: Secondary | ICD-10-CM

## 2015-11-18 DIAGNOSIS — Z87891 Personal history of nicotine dependence: Secondary | ICD-10-CM | POA: Diagnosis not present

## 2015-11-18 DIAGNOSIS — M1990 Unspecified osteoarthritis, unspecified site: Secondary | ICD-10-CM | POA: Diagnosis not present

## 2015-11-18 DIAGNOSIS — Z7982 Long term (current) use of aspirin: Secondary | ICD-10-CM | POA: Insufficient documentation

## 2015-11-18 DIAGNOSIS — M549 Dorsalgia, unspecified: Secondary | ICD-10-CM | POA: Diagnosis present

## 2015-11-18 DIAGNOSIS — I10 Essential (primary) hypertension: Secondary | ICD-10-CM | POA: Diagnosis not present

## 2015-11-18 DIAGNOSIS — Z8639 Personal history of other endocrine, nutritional and metabolic disease: Secondary | ICD-10-CM | POA: Insufficient documentation

## 2015-11-18 DIAGNOSIS — R109 Unspecified abdominal pain: Secondary | ICD-10-CM | POA: Diagnosis not present

## 2015-11-18 DIAGNOSIS — Z79899 Other long term (current) drug therapy: Secondary | ICD-10-CM | POA: Insufficient documentation

## 2015-11-18 LAB — CBC WITH DIFFERENTIAL/PLATELET
Basophils Absolute: 0.1 10*3/uL (ref 0.0–0.1)
Basophils Relative: 1 %
Eosinophils Absolute: 0.1 10*3/uL (ref 0.0–0.7)
Eosinophils Relative: 2 %
HEMATOCRIT: 34.3 % — AB (ref 36.0–46.0)
HEMOGLOBIN: 10.7 g/dL — AB (ref 12.0–15.0)
LYMPHS ABS: 1.2 10*3/uL (ref 0.7–4.0)
Lymphocytes Relative: 15 %
MCH: 26.3 pg (ref 26.0–34.0)
MCHC: 31.2 g/dL (ref 30.0–36.0)
MCV: 84.3 fL (ref 78.0–100.0)
MONO ABS: 0.8 10*3/uL (ref 0.1–1.0)
MONOS PCT: 10 %
NEUTROS ABS: 5.9 10*3/uL (ref 1.7–7.7)
NEUTROS PCT: 72 %
Platelets: 377 10*3/uL (ref 150–400)
RBC: 4.07 MIL/uL (ref 3.87–5.11)
RDW: 15.4 % (ref 11.5–15.5)
WBC: 8.2 10*3/uL (ref 4.0–10.5)

## 2015-11-18 LAB — I-STAT CHEM 8, ED
BUN: 28 mg/dL — AB (ref 6–20)
CHLORIDE: 103 mmol/L (ref 101–111)
CREATININE: 1.5 mg/dL — AB (ref 0.44–1.00)
Calcium, Ion: 1.19 mmol/L (ref 1.13–1.30)
Glucose, Bld: 94 mg/dL (ref 65–99)
HCT: 35 % — ABNORMAL LOW (ref 36.0–46.0)
Hemoglobin: 11.9 g/dL — ABNORMAL LOW (ref 12.0–15.0)
Potassium: 4.3 mmol/L (ref 3.5–5.1)
SODIUM: 139 mmol/L (ref 135–145)
TCO2: 28 mmol/L (ref 0–100)

## 2015-11-18 LAB — I-STAT TROPONIN, ED: Troponin i, poc: 0 ng/mL (ref 0.00–0.08)

## 2015-11-18 NOTE — ED Notes (Signed)
Bed: [facility] Expected date:  Expected time:  Means of arrival:  Comments: EMS 82 yo female lower back pain 7/10

## 2015-11-18 NOTE — ED Provider Notes (Signed)
CSN: [id number]     Arrival date & time 11/18/15  1928 History   First MD Initiated Contact with Patient 11/18/15 2042     Chief Complaint  Patient presents with  . Back Pain  . Abdominal Pain     (Consider location/radiation/quality/duration/timing/severity/associated sxs/prior Treatment) The history is provided by the patient and the EMS personnel. History limited by: Pt hard of hearing, elderly, not answering all questions.     Shannon Cruz is a pleasant, elderly, hard-of-hearing 82 y.o. female with hx of HTN, HLD, and RBBB who presents to ER with complaint of one day of back and abdominal pain which began yesterday after she was cleaning and bending over a lot.  The pt points to her central abdomen when asked where she was feeling pain, but she denies pain currently.  She was unable to specify where she had back pain.  She denies fever, N, V, D, constipation, falls, weakness, numbness, dysuria, hematuria.  She denies falls or injury.  No other complaints.  Past Medical History  Diagnosis Date  . RBBB (right bundle branch block) 11/20/2010    Echo - EF >55%; flow pattern suggestive of impaired LV relaxation, proximal septal thickening noted; mitral valve mildly thickened; mild mitral annular calcification; aortic root sclerosis/calcification  . Hypertension   . H/O diastolic dysfunction   . Heart palpitations   . Arthritis    History reviewed. No pertinent past surgical history. No family history on file. Social History  Substance Use Topics  . Smoking status: Former Smoker    Quit date: 11/05/1982  . Smokeless tobacco: None  . Alcohol Use: No   OB History    No data available     Review of Systems  Unable to perform ROS: Age  Constitutional: Negative.   HENT: Negative.   Respiratory: Negative for chest tightness and shortness of breath.   Cardiovascular: Negative for chest pain.  Genitourinary: Negative.   Skin: Negative.   Neurological: Negative.  Negative for  weakness and numbness.  All other systems reviewed and are negative.     Allergies  Review of patient's allergies indicates no known allergies.  Home Medications   Prior to Admission medications   Medication Sig Start Date End Date Taking? Authorizing Provider  amLODipine (NORVASC) 10 MG tablet Take 1 tablet by mouth daily. 07/06/14  Yes Historical Provider, MD  aspirin 81 MG tablet Take 81 mg by mouth daily.   Yes Historical Provider, MD  furosemide (LASIX) 20 MG tablet Take 1 tablet by mouth daily as needed for fluid or edema.  07/06/14  Yes Historical Provider, MD  RESTASIS 0.05 % ophthalmic emulsion Place 1 drop into both eyes 2 (two) times daily. 07/14/15  Yes Historical Provider, MD   BP 167/85 mmHg  Pulse 73  Temp(Src) 98.4 F (36.9 C) (Oral)  Resp 17  SpO2 98% Physical Exam  Constitutional: She is oriented to person, place, and time. She appears well-developed and well-nourished. No distress.  HENT:  Head: Normocephalic and atraumatic.  Nose: Nose normal.  Mouth/Throat: Oropharynx is clear and moist. No oropharyngeal exudate.  Eyes: Conjunctivae, EOM and lids are normal. Pupils are equal, round, and reactive to light. Right eye exhibits no discharge. Left eye exhibits no discharge. No scleral icterus.  Neck: Normal range of motion. Neck supple. No JVD present. No spinous process tenderness and no muscular tenderness present. No rigidity. No tracheal deviation present. No thyromegaly present.  Cardiovascular: Normal rate, regular rhythm, normal heart sounds and intact distal  pulses.  Exam reveals no gallop and no friction rub.   No murmur heard. 2+ radial pulses, 2+ DP pulses, no abdominal bruit, pulsatile abdominal aorta palpated  Pulmonary/Chest: Effort normal and breath sounds normal. No respiratory distress. She has no wheezes. She has no rales. She exhibits no tenderness.  Abdominal: Soft. Normal appearance and bowel sounds are normal. She exhibits no distension and no mass.  There is no tenderness. There is no rigidity, no rebound, no guarding, no CVA tenderness, no tenderness at McBurney's point and negative Murphy's sign.  Abdomen soft, NTND, BS x4, no CVA tenderness  Musculoskeletal: Normal range of motion. She exhibits no edema or tenderness.       Cervical back: Normal. She exhibits no tenderness and no bony tenderness.       Thoracic back: Normal. She exhibits no tenderness and no bony tenderness.       Lumbar back: Normal. She exhibits no tenderness and no bony tenderness.  Lymphadenopathy:    She has no cervical adenopathy.  Neurological: She is alert and oriented to person, place, and time. She has normal reflexes. No cranial nerve deficit. She exhibits normal muscle tone. Coordination normal.  Major Cranial nerves without deficit, no facial droop Normal strength in upper and lower extremities bilaterally including dorsiflexion and plantar flexion, strong and equal grip strength Sensation normal to light and sharp touch Moves extremities without ataxia, coordination intact Steady slow gait, pt ambulated with cane   Skin: Skin is warm and dry. No rash noted. She is not diaphoretic. No erythema. No pallor.  Psychiatric: She has a normal mood and affect. Her behavior is normal. Judgment and thought content normal.  Nursing note and vitals reviewed.   ED Course  Procedures (including critical care time) Labs Review Labs Reviewed  CBC WITH DIFFERENTIAL/PLATELET - Abnormal; Notable for the following:    Hemoglobin 10.7 (*)    HCT 34.3 (*)    All other components within normal limits  URINALYSIS, ROUTINE W REFLEX MICROSCOPIC (NOT AT [facility]) - Abnormal; Notable for the following:    Leukocytes, UA SMALL (*)    All other components within normal limits  URINE MICROSCOPIC-ADD ON - Abnormal; Notable for the following:    Squamous Epithelial / LPF 0-5 (*)    Bacteria, UA RARE (*)    All other components within normal limits  I-STAT CHEM 8, ED - Abnormal;  Notable for the following:    BUN 28 (*)    Creatinine, Ser 1.50 (*)    Hemoglobin 11.9 (*)    HCT 35.0 (*)    All other components within normal limits  I-STAT TROPOININ, ED    Imaging Review Dg Abd Acute W/chest  11/18/2015  CLINICAL DATA:  Abdominal and back pain beginning yesterday. Hypertension and right bundle branch block. EXAM: DG ABDOMEN ACUTE W/ 1V CHEST COMPARISON:  Chest radiograph on 09/30/2014 FINDINGS: There is no evidence of dilated bowel loops or free intraperitoneal air. Extensive aortoiliac atherosclerotic calcification noted. Thoracolumbar spine degenerative changes also seen. Mild cardiomegaly stable. Both lungs are well aerated and clear. No evidence of pneumothorax or pleural effusion . IMPRESSION: Unremarkable bowel gas pattern.  No acute findings. Mild cardiomegaly.  No active lung disease. Electronically Signed   By: Earle Gell M.D.   On: 11/18/2015 21:49   No results found. I have personally reviewed and evaluated these images and lab results as part of my medical decision-making.   EKG Interpretation None      MDM  Pt brought to the ER via [facility] with complaints of abdominal pain and back pain.  Pt is very hard of hearing, difficult historian.  She has pointed to the middle of her stomach and stated she is hungry and then she laughed.  She has been unable to describe her back pain.  She has ambulated without difficulty, gotten in and out of the ER gurney without difficulty, or grimace.  Does not appear uncomfortable with movement.  She has intact sensation and strength in all extremities.  She denies dysuria, hematuria, N, V, D.   Pt has documented exam consistent with todays regarding palpable abdominal aorta, has been worked up before with [place] and CT scans, < 3cm, no aneurysm.  No concern for AAA today, pt has symmetrical and strong pulses, normal coloring in all extremities and abdomen, no bruits auscultated, no neurological deficit.  Acute abdomen & chest  negative for acute pathology.  Pt has no back pain on exam.  Pt had small leukocytes, rare bacteria with UA, she denies sx.  Will not treat.  Culture added Other labs consistent with baseline kidney function and H/H.    I reviewed the patients labs with her and her son who came to the bedside.  She was comfortable discharging home, with her son who will take her home.  VSS with mild hypertension.  Due for evening meds.  D/C home and will follow up with PCP as needed.  Final diagnoses:  Bilateral low back pain without sciatica    Delsa Grana, PA-C 11/21/15 0155  Leonard Schwartz, MD 11/23/15 [phone]

## 2015-11-18 NOTE — ED Notes (Addendum)
Per [facility] , pt. Form home with complaint of abdominal and back pain which started yesterday, pain at 8/10. Denies N/V/D. Alert and oriented x3. Denies of fall.

## 2015-11-19 LAB — URINALYSIS, ROUTINE W REFLEX MICROSCOPIC
Bilirubin Urine: NEGATIVE
GLUCOSE, UA: NEGATIVE mg/dL
HGB URINE DIPSTICK: NEGATIVE
Ketones, ur: NEGATIVE mg/dL
Nitrite: NEGATIVE
PROTEIN: NEGATIVE mg/dL
Specific Gravity, Urine: 1.007 (ref 1.005–1.030)
pH: 6.5 (ref 5.0–8.0)

## 2015-11-19 LAB — URINE MICROSCOPIC-ADD ON: RBC / HPF: NONE SEEN RBC/hpf (ref 0–5)

## 2015-11-19 NOTE — Discharge Instructions (Signed)
Back Pain, Adult °Back pain is very common in adults. The cause of back pain is rarely dangerous and the pain often gets better over time. The cause of your back pain may not be known. Some common causes of back pain include: °· Strain of the muscles or ligaments supporting the spine. °· Wear and tear (degeneration) of the spinal disks. °· Arthritis. °· Direct injury to the back. °For many people, back pain may return. Since back pain is rarely dangerous, most people can learn to manage this condition on their own. °HOME CARE INSTRUCTIONS °Watch your back pain for any changes. The following actions may help to lessen any discomfort you are feeling: °· Remain active. It is stressful on your back to sit or stand in one place for long periods of time. Do not sit, drive, or stand in one place for more than 30 minutes at a time. Take short walks on even surfaces as soon as you are able. Try to increase the length of time you walk each day. °· Exercise regularly as directed by your health care provider. Exercise helps your back heal faster. It also helps avoid future injury by keeping your muscles strong and flexible. °· Do not stay in bed. Resting more than 1-2 days can delay your recovery. °· Pay attention to your body when you bend and lift. The most comfortable positions are those that put less stress on your recovering back. Always use proper lifting techniques, including: °¨ Bending your knees. °¨ Keeping the load close to your body. °¨ Avoiding twisting. °· Find a comfortable position to sleep. Use a firm mattress and lie on your side with your knees slightly bent. If you lie on your back, put a pillow under your knees. °· Avoid feeling anxious or stressed. Stress increases muscle tension and can worsen back pain. It is important to recognize when you are anxious or stressed and learn ways to manage it, such as with exercise. °· Take medicines only as directed by your health care provider. Over-the-counter  medicines to reduce pain and inflammation are often the most helpful. Your health care provider may prescribe muscle relaxant drugs. These medicines help dull your pain so you can more quickly return to your normal activities and healthy exercise. °· Apply ice to the injured area: °¨ Put ice in a plastic bag. °¨ Place a towel between your skin and the bag. °¨ Leave the ice on for 20 minutes, 2-3 times a day for the first 2-3 days. After that, ice and heat may be alternated to reduce pain and spasms. °· Maintain a healthy weight. Excess weight puts extra stress on your back and makes it difficult to maintain good posture. °SEEK MEDICAL CARE IF: °· You have pain that is not relieved with rest or medicine. °· You have increasing pain going down into the legs or buttocks. °· You have pain that does not improve in one week. °· You have night pain. °· You lose weight. °· You have a fever or chills. °SEEK IMMEDIATE MEDICAL CARE IF:  °· You develop new bowel or bladder control problems. °· You have unusual weakness or numbness in your arms or legs. °· You develop nausea or vomiting. °· You develop abdominal pain. °· You feel faint. °  °This information is not intended to replace advice given to you by your health care provider. Make sure you discuss any questions you have with your health care provider. °  °Document Released: 10/22/2005 Document Revised: 11/12/2014 Document Reviewed: 02/23/2014 °Elsevier Interactive Patient Education ©2016 [employer] ° °Back Injury Prevention °Back injuries can be very painful. They can also be difficult to heal. After having one back injury, you are more likely to injure your back again. It is important to learn how to avoid injuring or re-injuring your back. The following tips can help you to prevent a back injury. °WHAT SHOULD I KNOW ABOUT PHYSICAL FITNESS? °· Exercise for 30 minutes per day on most days of the week or as told by your doctor. Make sure to: °¨ Do aerobic exercises,  such as walking, jogging, biking, or swimming. °¨ Do exercises that increase balance and strength, such as tai chi and yoga. °¨ Do stretching exercises. This helps with flexibility. °¨ Try to develop strong belly (abdominal) muscles. Your belly muscles help to support your back. °· Stay at a healthy weight. This helps to decrease your risk of a back injury. °WHAT SHOULD I KNOW ABOUT MY DIET? °· Talk with your doctor about your overall diet. Take supplements and vitamins only as told by your doctor. °· Talk with your doctor about how much calcium and vitamin D you need each day. These nutrients help to prevent weakening of the bones (osteoporosis). °· Include good sources of calcium in your diet, such as: °¨ Dairy products. °¨ Green leafy vegetables. °¨ Products that have had calcium added to them (fortified). °· Include good sources of vitamin D in your diet, such as: °¨ Milk. °¨ Foods that have had vitamin D added to them. °WHAT SHOULD I KNOW ABOUT MY POSTURE? °· Sit up straight and stand up straight. Avoid leaning forward when you sit or hunching over when you stand. °· Choose chairs that have good low-back (lumbar) support. °· If you work at a desk, sit close to it so you do not need to lean over. Keep your chin tucked in. Keep your neck drawn back. Keep your elbows bent so your arms look like the letter "L" (right angle). °· Sit high and close to the steering wheel when you drive. Add a low-back support to your car seat, if needed. °· Avoid sitting or standing in one position for very long. Take breaks to get up, stretch, and walk around at least one time every hour. Take breaks every hour if you are driving for long periods of time. °· Sleep on your side with your knees slightly bent, or sleep on your back with a pillow under your knees. Do not lie on the front of your body to sleep. °WHAT SHOULD I KNOW ABOUT LIFTING, TWISTING, AND REACHING °Lifting and Heavy Lifting  °· Avoid heavy lifting, especially lifting  over and over again. If you must do heavy lifting: °¨ Stretch before lifting. °¨ Work slowly. °¨ Rest between lifts. °¨ Use a tool such as a cart or a dolly to move objects if one is available. °¨ Make several small trips instead of carrying one heavy load. °¨ Ask for help when you need it, especially when moving big objects. °· Follow these steps when lifting: °¨ Stand with your feet shoulder-width apart. °¨ Get as close to the object as you can. Do not pick up a heavy object that is far from your body. °¨ Use handles or lifting straps if they are available. °¨ Bend at your knees. Squat down, but keep your heels off the floor. °¨ Keep your shoulders back. Keep your chin tucked in. Keep your back straight. °¨ Lift the object slowly while you tighten the muscles in your legs, belly, and butt.   Keep the object as close to the center of your body as possible.  Follow these steps when putting down a heavy load:  Stand with your feet shoulder-width apart.  Lower the object slowly while you tighten the muscles in your legs, belly, and butt. Keep the object as close to the center of your body as possible.  Keep your shoulders back. Keep your chin tucked in. Keep your back straight.  Bend at your knees. Squat down, but keep your heels off the floor.  Use handles or lifting straps if they are available. Twisting and Reaching  Avoid lifting heavy objects above your waist.  Do not twist at your waist while you are lifting or carrying a load. If you need to turn, move your feet.  Do not bend over without bending at your knees.  Avoid reaching over your head, across a table, or for an object on a high surface.  WHAT ARE SOME OTHER TIPS?  Avoid wet floors and icy ground. Keep sidewalks clear of ice to prevent falls.   Do not sleep on a mattress that is too soft or too hard.   Keep items that you use often within easy reach.   Put heavier objects on shelves at waist level, and put lighter objects  on lower or higher shelves.  Find ways to lower your stress, such as:  Exercise.  Massage.  Relaxation techniques.  Talk with your doctor if you feel anxious or depressed. These conditions can make back pain worse.  Wear flat heel shoes with cushioned soles.  Avoid making quick (sudden) movements.  Use both shoulder straps when carrying a backpack.  Do not use any tobacco products, including cigarettes, chewing tobacco, or electronic cigarettes. If you need help quitting, ask your doctor.   This information is not intended to replace advice given to you by your health care provider. Make sure you discuss any questions you have with your health care provider.   Document Released: 04/09/2008 Document Revised: 03/08/2015 Document Reviewed: 10/26/2014 Elsevier Interactive Patient Education [employer].

## 2016-04-05 ENCOUNTER — Encounter (HOSPITAL_COMMUNITY): Payer: Self-pay | Admitting: Emergency Medicine

## 2016-04-05 ENCOUNTER — Emergency Department (HOSPITAL_COMMUNITY): Payer: Medicare Other

## 2016-04-05 ENCOUNTER — Emergency Department (HOSPITAL_COMMUNITY)
Admission: EM | Admit: 2016-04-05 | Discharge: 2016-04-05 | Disposition: A | Discharge status: Home / Self Care | Payer: Medicare Other | Attending: Emergency Medicine | Admitting: Emergency Medicine

## 2016-04-05 DIAGNOSIS — Z87891 Personal history of nicotine dependence: Secondary | ICD-10-CM | POA: Diagnosis not present

## 2016-04-05 DIAGNOSIS — I10 Essential (primary) hypertension: Secondary | ICD-10-CM | POA: Insufficient documentation

## 2016-04-05 DIAGNOSIS — R1030 Lower abdominal pain, unspecified: Secondary | ICD-10-CM | POA: Diagnosis present

## 2016-04-05 DIAGNOSIS — N390 Urinary tract infection, site not specified: Secondary | ICD-10-CM | POA: Diagnosis not present

## 2016-04-05 DIAGNOSIS — M1990 Unspecified osteoarthritis, unspecified site: Secondary | ICD-10-CM | POA: Diagnosis not present

## 2016-04-05 DIAGNOSIS — Z7982 Long term (current) use of aspirin: Secondary | ICD-10-CM | POA: Insufficient documentation

## 2016-04-05 LAB — COMPREHENSIVE METABOLIC PANEL
ALT: 9 U/L — AB (ref 14–54)
AST: 22 U/L (ref 15–41)
Albumin: 3.9 g/dL (ref 3.5–5.0)
Alkaline Phosphatase: 74 U/L (ref 38–126)
Anion gap: 7 (ref 5–15)
BUN: 26 mg/dL — ABNORMAL HIGH (ref 6–20)
CHLORIDE: 102 mmol/L (ref 101–111)
CO2: 29 mmol/L (ref 22–32)
CREATININE: 1.38 mg/dL — AB (ref 0.44–1.00)
Calcium: 9.9 mg/dL (ref 8.9–10.3)
GFR, EST AFRICAN AMERICAN: 38 mL/min — AB (ref >60)
GFR, EST NON AFRICAN AMERICAN: 33 mL/min — AB (ref >60)
Glucose, Bld: 106 mg/dL — ABNORMAL HIGH (ref 65–99)
Potassium: 4.3 mmol/L (ref 3.5–5.1)
SODIUM: 138 mmol/L (ref 135–145)
Total Bilirubin: 0.7 mg/dL (ref 0.3–1.2)
Total Protein: 7.4 g/dL (ref 6.5–8.1)

## 2016-04-05 LAB — URINALYSIS, ROUTINE W REFLEX MICROSCOPIC
Bilirubin Urine: NEGATIVE
GLUCOSE, UA: NEGATIVE mg/dL
Hgb urine dipstick: NEGATIVE
Ketones, ur: NEGATIVE mg/dL
Nitrite: NEGATIVE
PROTEIN: 30 mg/dL — AB
SPECIFIC GRAVITY, URINE: 1.01 (ref 1.005–1.030)
pH: 7 (ref 5.0–8.0)

## 2016-04-05 LAB — CBC
HCT: 34.9 % — ABNORMAL LOW (ref 36.0–46.0)
HEMOGLOBIN: 11.3 g/dL — AB (ref 12.0–15.0)
MCH: 26.2 pg (ref 26.0–34.0)
MCHC: 32.4 g/dL (ref 30.0–36.0)
MCV: 80.8 fL (ref 78.0–100.0)
PLATELETS: 370 10*3/uL (ref 150–400)
RBC: 4.32 MIL/uL (ref 3.87–5.11)
RDW: 15.4 % (ref 11.5–15.5)
WBC: 9.1 10*3/uL (ref 4.0–10.5)

## 2016-04-05 LAB — URINE MICROSCOPIC-ADD ON: RBC / HPF: NONE SEEN RBC/hpf (ref 0–5)

## 2016-04-05 LAB — LIPASE, BLOOD: LIPASE: 26 U/L (ref 11–51)

## 2016-04-05 MED ORDER — CEPHALEXIN 500 MG PO CAPS: 500.0000 mg | ORAL_CAPSULE | Freq: Four times a day (QID) | ORAL | Status: DC

## 2016-04-05 NOTE — ED Provider Notes (Signed)
CSN: [id number]     Arrival date & time 04/05/16  1627 History   First MD Initiated Contact with Patient 04/05/16 1858     Chief Complaint  Patient presents with  . Abdominal Pain     (Consider location/radiation/quality/duration/timing/severity/associated sxs/prior Treatment) HPI Comments: Patient here complaining of lower abdominal pain times a week as well as constipation. States that she had a hard stool this week. Has had some weakness in her legs which cause her to fall to the ground. She denies any hip pain. She notes some urinary frequency but denies any dysuria. No fever or chills. No vomiting or diarrhea. Patient does take diuretics for chronic lower extremity edema. EMS called and patient transported here  Patient is a 82 y.o. female presenting with abdominal pain. The history is provided by the patient and a relative.  Abdominal Pain   Past Medical History  Diagnosis Date  . RBBB (right bundle branch block) 11/20/2010    Echo - EF >55%; flow pattern suggestive of impaired LV relaxation, proximal septal thickening noted; mitral valve mildly thickened; mild mitral annular calcification; aortic root sclerosis/calcification  . Hypertension   . H/O diastolic dysfunction   . Heart palpitations   . Arthritis    History reviewed. No pertinent past surgical history. History reviewed. No pertinent family history. Social History  Substance Use Topics  . Smoking status: Former Smoker    Quit date: 11/05/1982  . Smokeless tobacco: None  . Alcohol Use: No   OB History    No data available     Review of Systems  Gastrointestinal: Positive for abdominal pain.  All other systems reviewed and are negative.     Allergies  Review of patient's allergies indicates no known allergies.  Home Medications   Prior to Admission medications   Medication Sig Start Date End Date Taking? Authorizing Provider  amLODipine (NORVASC) 10 MG tablet Take 1 tablet by mouth daily. 07/06/14    Historical Provider, MD  aspirin 81 MG tablet Take 81 mg by mouth daily.    Historical Provider, MD  furosemide (LASIX) 20 MG tablet Take 1 tablet by mouth daily as needed for fluid or edema.  07/06/14   Historical Provider, MD  RESTASIS 0.05 % ophthalmic emulsion Place 1 drop into both eyes 2 (two) times daily. 07/14/15   Historical Provider, MD   BP 172/78 mmHg  Pulse 82  Temp(Src) 98.7 F (37.1 C) (Oral)  Resp 18  SpO2 98% Physical Exam  Constitutional: She is oriented to person, place, and time. She appears well-developed and well-nourished.  Non-toxic appearance. No distress.  HENT:  Head: Normocephalic and atraumatic.  Eyes: Conjunctivae, EOM and lids are normal. Pupils are equal, round, and reactive to light.  Neck: Normal range of motion. Neck supple. No tracheal deviation present. No thyroid mass present.  Cardiovascular: Normal rate, regular rhythm and normal heart sounds.  Exam reveals no gallop.   No murmur heard. Pulmonary/Chest: Effort normal and breath sounds normal. No stridor. No respiratory distress. She has no decreased breath sounds. She has no wheezes. She has no rhonchi. She has no rales.  Abdominal: Soft. Normal appearance and bowel sounds are normal. She exhibits no distension. There is tenderness in the suprapubic area. There is no rigidity, no rebound, no guarding and no CVA tenderness.  Musculoskeletal: Normal range of motion. She exhibits no edema or tenderness.  2+ bilateral lower extremity pitting edema  Neurological: She is alert and oriented to person, place, and time. She has  normal strength. No cranial nerve deficit or sensory deficit. GCS eye subscore is 4. GCS verbal subscore is 5. GCS motor subscore is 6.  Skin: Skin is warm and dry. No abrasion and no rash noted.  Psychiatric: She has a normal mood and affect. Her speech is normal and behavior is normal.  Nursing note and vitals reviewed.   ED Course  Procedures (including critical care time) Labs  Review Labs Reviewed  COMPREHENSIVE METABOLIC PANEL - Abnormal; Notable for the following:    Glucose, Bld 106 (*)    BUN 26 (*)    Creatinine, Ser 1.38 (*)    ALT 9 (*)    GFR calc non Af Amer 33 (*)    GFR calc Af Amer 38 (*)    All other components within normal limits  CBC - Abnormal; Notable for the following:    Hemoglobin 11.3 (*)    HCT 34.9 (*)    All other components within normal limits  LIPASE, BLOOD  URINALYSIS, ROUTINE W REFLEX MICROSCOPIC (NOT AT [facility])    Imaging Review No results found. I have personally reviewed and evaluated these images and lab results as part of my medical decision-making.   EKG Interpretation None      MDM   Final diagnoses:  None   Patient to be treated for urinary tract infection and will follow-up with her Dr.     Lacretia Leigh, MD 04/05/16 2121

## 2016-04-05 NOTE — ED Notes (Signed)
Patient transported to X-ray 

## 2016-04-05 NOTE — Discharge Instructions (Signed)
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
## 2016-04-11 ENCOUNTER — Encounter (HOSPITAL_COMMUNITY): Payer: Self-pay | Admitting: Emergency Medicine

## 2016-04-11 ENCOUNTER — Emergency Department (HOSPITAL_COMMUNITY)
Admission: EM | Admit: 2016-04-11 | Discharge: 2016-04-11 | Disposition: A | Discharge status: Home / Self Care | Payer: Medicare Other | Attending: Emergency Medicine | Admitting: Emergency Medicine

## 2016-04-11 DIAGNOSIS — Z87891 Personal history of nicotine dependence: Secondary | ICD-10-CM | POA: Insufficient documentation

## 2016-04-11 DIAGNOSIS — N390 Urinary tract infection, site not specified: Secondary | ICD-10-CM | POA: Diagnosis not present

## 2016-04-11 DIAGNOSIS — I10 Essential (primary) hypertension: Secondary | ICD-10-CM | POA: Diagnosis not present

## 2016-04-11 DIAGNOSIS — M1990 Unspecified osteoarthritis, unspecified site: Secondary | ICD-10-CM | POA: Diagnosis not present

## 2016-04-11 DIAGNOSIS — Z7982 Long term (current) use of aspirin: Secondary | ICD-10-CM | POA: Insufficient documentation

## 2016-04-11 DIAGNOSIS — R1030 Lower abdominal pain, unspecified: Secondary | ICD-10-CM | POA: Diagnosis present

## 2016-04-11 LAB — URINE MICROSCOPIC-ADD ON

## 2016-04-11 LAB — CBC
HEMATOCRIT: 34.6 % — AB (ref 36.0–46.0)
HEMOGLOBIN: 11.4 g/dL — AB (ref 12.0–15.0)
MCH: 26.5 pg (ref 26.0–34.0)
MCHC: 32.9 g/dL (ref 30.0–36.0)
MCV: 80.5 fL (ref 78.0–100.0)
Platelets: 376 10*3/uL (ref 150–400)
RBC: 4.3 MIL/uL (ref 3.87–5.11)
RDW: 15.1 % (ref 11.5–15.5)
WBC: 9.4 10*3/uL (ref 4.0–10.5)

## 2016-04-11 LAB — URINALYSIS, ROUTINE W REFLEX MICROSCOPIC
BILIRUBIN URINE: NEGATIVE
GLUCOSE, UA: NEGATIVE mg/dL
Hgb urine dipstick: NEGATIVE
Ketones, ur: NEGATIVE mg/dL
NITRITE: NEGATIVE
PH: 7 (ref 5.0–8.0)
Protein, ur: NEGATIVE mg/dL
SPECIFIC GRAVITY, URINE: 1.008 (ref 1.005–1.030)

## 2016-04-11 LAB — COMPREHENSIVE METABOLIC PANEL
ALK PHOS: 85 U/L (ref 38–126)
ALT: 9 U/L — ABNORMAL LOW (ref 14–54)
ANION GAP: 8 (ref 5–15)
AST: 24 U/L (ref 15–41)
Albumin: 4.1 g/dL (ref 3.5–5.0)
BILIRUBIN TOTAL: 0.8 mg/dL (ref 0.3–1.2)
BUN: 26 mg/dL — AB (ref 6–20)
CALCIUM: 9.7 mg/dL (ref 8.9–10.3)
CO2: 26 mmol/L (ref 22–32)
Chloride: 103 mmol/L (ref 101–111)
Creatinine, Ser: 1.49 mg/dL — ABNORMAL HIGH (ref 0.44–1.00)
GFR calc Af Amer: 35 mL/min — ABNORMAL LOW (ref >60)
GFR, EST NON AFRICAN AMERICAN: 30 mL/min — AB (ref >60)
Glucose, Bld: 110 mg/dL — ABNORMAL HIGH (ref 65–99)
POTASSIUM: 4.6 mmol/L (ref 3.5–5.1)
Sodium: 137 mmol/L (ref 135–145)
TOTAL PROTEIN: 7.8 g/dL (ref 6.5–8.1)

## 2016-04-11 LAB — LIPASE, BLOOD: Lipase: 31 U/L (ref 11–51)

## 2016-04-11 MED ORDER — ACETAMINOPHEN 500 MG PO TABS
1000.0000 mg | ORAL_TABLET | Freq: Once | ORAL | Status: AC
  Administered 2016-04-11: 1000 mg via ORAL
  Filled 2016-04-11: qty 2

## 2016-04-11 MED ORDER — CIPROFLOXACIN HCL 500 MG PO TABS: 500.0000 mg | ORAL_TABLET | Freq: Two times a day (BID) | ORAL | Status: DC

## 2016-04-11 NOTE — ED Notes (Signed)
MD at bedside. 

## 2016-04-11 NOTE — ED Provider Notes (Signed)
CSN: [id number]     Arrival date & time 04/11/16  0920 History   First MD Initiated Contact with Patient 04/11/16 1004     Chief Complaint  Patient presents with  . Abdominal Pain  . Dysuria     (Consider location/radiation/quality/duration/timing/severity/associated sxs/prior Treatment) Patient is a 82 y.o. female presenting with abdominal pain. The history is provided by the patient.  Abdominal Pain Pain location:  Suprapubic Pain quality: aching   Pain radiates to:  Does not radiate Pain severity:  Mild Onset quality:  Gradual Timing:  Constant Progression:  Unchanged Chronicity:  Recurrent Relieved by:  Nothing Worsened by:  Nothing tried Ineffective treatments: keflex. Associated symptoms: constipation and dysuria   Associated symptoms: no fever, no hematochezia and no vomiting   Risk factors: being elderly     Past Medical History  Diagnosis Date  . RBBB (right bundle branch block) 11/20/2010    Echo - EF >55%; flow pattern suggestive of impaired LV relaxation, proximal septal thickening noted; mitral valve mildly thickened; mild mitral annular calcification; aortic root sclerosis/calcification  . Hypertension   . H/O diastolic dysfunction   . Heart palpitations   . Arthritis    History reviewed. No pertinent past surgical history. History reviewed. No pertinent family history. Social History  Substance Use Topics  . Smoking status: Former Smoker    Quit date: 11/05/1982  . Smokeless tobacco: None  . Alcohol Use: No   OB History    No data available     Review of Systems  Constitutional: Negative for fever.  Gastrointestinal: Positive for abdominal pain and constipation. Negative for vomiting and hematochezia.  Genitourinary: Positive for dysuria.  All other systems reviewed and are negative.     Allergies  Review of patient's allergies indicates no known allergies.  Home Medications   Prior to Admission medications   Medication Sig Start Date End  Date Taking? Authorizing Provider  aspirin 81 MG tablet Take 81 mg by mouth daily.   Yes Historical Provider, MD  cephALEXin (KEFLEX) 500 MG capsule Take 1 capsule (500 mg total) by mouth 4 (four) times daily. 04/05/16  Yes Lacretia Leigh, MD  RESTASIS 0.05 % ophthalmic emulsion Place 1 drop into both eyes 2 (two) times daily. 07/14/15  Yes Historical Provider, MD  amLODipine (NORVASC) 10 MG tablet Take 1 tablet by mouth daily. Reported on 04/11/2016 07/06/14   Historical Provider, MD  furosemide (LASIX) 20 MG tablet Take 1 tablet by mouth daily as needed for fluid or edema. Reported on 04/11/2016 07/06/14   Historical Provider, MD   BP 173/68 mmHg  Pulse 80  Temp(Src) 97.6 F (36.4 C) (Oral)  Resp 16  SpO2 100% Physical Exam  Constitutional: She is oriented to person, place, and time. She appears well-developed and well-nourished. No distress.  HENT:  Head: Normocephalic.  Eyes: Conjunctivae are normal.  Neck: Neck supple. No tracheal deviation present.  Cardiovascular: Normal rate, regular rhythm and normal heart sounds.   Pulmonary/Chest: Effort normal and breath sounds normal. No respiratory distress.  Abdominal: Soft. She exhibits no distension. There is no tenderness. There is no rebound and no guarding.  Musculoskeletal:       Left wrist: She exhibits normal range of motion, no tenderness, no bony tenderness, no swelling, no effusion, no deformity and no laceration.  Neurological: She is alert and oriented to person, place, and time.  Skin: Skin is warm and dry.  Psychiatric: She has a normal mood and affect.  Vitals reviewed.  ED Course  Procedures (including critical care time) Labs Review Labs Reviewed  COMPREHENSIVE METABOLIC PANEL - Abnormal; Notable for the following:    Glucose, Bld 110 (*)    BUN 26 (*)    Creatinine, Ser 1.49 (*)    ALT 9 (*)    GFR calc non Af Amer 30 (*)    GFR calc Af Amer 35 (*)    All other components within normal limits  CBC - Abnormal; Notable  for the following:    Hemoglobin 11.4 (*)    HCT 34.6 (*)    All other components within normal limits  URINALYSIS, ROUTINE W REFLEX MICROSCOPIC (NOT AT [facility]) - Abnormal; Notable for the following:    Leukocytes, UA MODERATE (*)    All other components within normal limits  URINE MICROSCOPIC-ADD ON - Abnormal; Notable for the following:    Squamous Epithelial / LPF 0-5 (*)    Bacteria, UA FEW (*)    All other components within normal limits  URINE CULTURE  LIPASE, BLOOD    Imaging Review No results found. I have personally reviewed and evaluated these images and lab results as part of my medical decision-making.   EKG Interpretation None      MDM   Final diagnoses:  Recurrent urinary tract infection   82 y.o. female presents with continued occasional lower abdominal pain and some dysuria that has persisted despite treatment with keflex for UTI. No culture data available for review. Labs reassuring with stable renal function and VS not suggestive of systemic infection. Also complaining of mild left arm pain which is likely mild arthritic pain. Will change to ciprofloxacin to assume failure of OP management pending culture. Pt needs to see her PCP or establish a new one.     Leo Grosser, MD 04/11/16 [phone]

## 2016-04-11 NOTE — ED Notes (Signed)
Patient made aware urine sample is needed. Patient states she cannot void at this time.

## 2016-04-11 NOTE — ED Notes (Signed)
Pt c/o abdominal pain and lower back pain x4 days. Pt reports burning with urination. Denies fever, N/V/D.

## 2016-04-11 NOTE — Discharge Instructions (Signed)

## 2016-04-11 NOTE — ED Notes (Signed)
Discharge instructions, follow up care, and rx x1 reviewed with patient. Patient verbalized understanding. 

## 2016-04-12 LAB — URINE CULTURE: Culture: NO GROWTH

## 2016-06-01 ENCOUNTER — Encounter (HOSPITAL_COMMUNITY): Payer: Self-pay

## 2016-06-01 ENCOUNTER — Emergency Department (HOSPITAL_COMMUNITY)
Admission: EM | Admit: 2016-06-01 | Discharge: 2016-06-01 | Disposition: A | Discharge status: Home / Self Care | Payer: Medicare Other | Attending: Emergency Medicine | Admitting: Emergency Medicine

## 2016-06-01 ENCOUNTER — Emergency Department (HOSPITAL_COMMUNITY): Payer: Medicare Other

## 2016-06-01 DIAGNOSIS — Z79899 Other long term (current) drug therapy: Secondary | ICD-10-CM | POA: Diagnosis not present

## 2016-06-01 DIAGNOSIS — Z87891 Personal history of nicotine dependence: Secondary | ICD-10-CM | POA: Insufficient documentation

## 2016-06-01 DIAGNOSIS — Y92009 Unspecified place in unspecified non-institutional (private) residence as the place of occurrence of the external cause: Secondary | ICD-10-CM | POA: Insufficient documentation

## 2016-06-01 DIAGNOSIS — Z7982 Long term (current) use of aspirin: Secondary | ICD-10-CM | POA: Insufficient documentation

## 2016-06-01 DIAGNOSIS — I10 Essential (primary) hypertension: Secondary | ICD-10-CM | POA: Diagnosis not present

## 2016-06-01 DIAGNOSIS — S0101XA Laceration without foreign body of scalp, initial encounter: Secondary | ICD-10-CM | POA: Insufficient documentation

## 2016-06-01 DIAGNOSIS — Y999 Unspecified external cause status: Secondary | ICD-10-CM | POA: Insufficient documentation

## 2016-06-01 DIAGNOSIS — Y9389 Activity, other specified: Secondary | ICD-10-CM | POA: Insufficient documentation

## 2016-06-01 DIAGNOSIS — S0990XA Unspecified injury of head, initial encounter: Secondary | ICD-10-CM

## 2016-06-01 DIAGNOSIS — Z23 Encounter for immunization: Secondary | ICD-10-CM | POA: Insufficient documentation

## 2016-06-01 DIAGNOSIS — W01198A Fall on same level from slipping, tripping and stumbling with subsequent striking against other object, initial encounter: Secondary | ICD-10-CM | POA: Diagnosis not present

## 2016-06-01 MED ORDER — LIDOCAINE-EPINEPHRINE 1 %-1:100000 IJ SOLN
20.0000 mL | Freq: Once | INTRAMUSCULAR | Status: AC
  Administered 2016-06-01: 1 mL via INTRADERMAL
  Filled 2016-06-01: qty 1

## 2016-06-01 MED ORDER — TETANUS-DIPHTH-ACELL PERTUSSIS 5-2.5-18.5 LF-MCG/0.5 IM SUSP
0.5000 mL | Freq: Once | INTRAMUSCULAR | Status: AC
  Administered 2016-06-01: 0.5 mL via INTRAMUSCULAR
  Filled 2016-06-01: qty 0.5

## 2016-06-01 NOTE — ED Provider Notes (Signed)
[facility] DEPT Provider Note   CSN: [id number] Arrival date & time: 06/01/16  2052  First Provider Contact:  None       History   Chief Complaint Chief Complaint  Patient presents with  . Fall    HPI Shannon Cruz is a 82 y.o. female.  82 yo F with a fall.  Went to the door to put a towel underneath and had her walker slide out from underneath her. She landed on the left side of her head. Denies loss of consciousness. Denies other injury. Complaining mostly of a left-sided headache. Denies chest pain abdominal pain back pain.   The history is provided by the patient.  Fall  This is a new problem. The current episode started 6 to 12 hours ago. The problem occurs constantly. The problem has not changed since onset.Pertinent negatives include no chest pain, no headaches and no shortness of breath. Nothing aggravates the symptoms. Nothing relieves the symptoms. She has tried nothing for the symptoms. The treatment provided no relief.    Past Medical History:  Diagnosis Date  . Arthritis   . H/O diastolic dysfunction   . Heart palpitations   . Hypertension   . RBBB (right bundle branch block) 11/20/2010   Echo - EF >55%; flow pattern suggestive of impaired LV relaxation, proximal septal thickening noted; mitral valve mildly thickened; mild mitral annular calcification; aortic root sclerosis/calcification    Patient Active Problem List   Diagnosis Date Noted  . Atrial bigeminy 10/09/2013  . HTN (hypertension) 03/19/2013  . RBBB (right bundle branch block) 03/19/2013  . Left shoulder pain 03/19/2013    History reviewed. No pertinent surgical history.  OB History    No data available       Home Medications    Prior to Admission medications   Medication Sig Start Date End Date Taking? Authorizing Provider  amLODipine (NORVASC) 10 MG tablet Take 1 tablet by mouth daily. Reported on 04/11/2016 07/06/14   Historical Provider, MD  aspirin 81 MG tablet Take 81 mg by  mouth daily.    Historical Provider, MD  ciprofloxacin (CIPRO) 500 MG tablet Take 1 tablet (500 mg total) by mouth 2 (two) times daily. 04/11/16   Leo Grosser, MD  furosemide (LASIX) 20 MG tablet Take 1 tablet by mouth daily as needed for fluid or edema. Reported on 04/11/2016 07/06/14   Historical Provider, MD  RESTASIS 0.05 % ophthalmic emulsion Place 1 drop into both eyes 2 (two) times daily. 07/14/15   Historical Provider, MD    Family History History reviewed. No pertinent family history.  Social History Social History  Substance Use Topics  . Smoking status: Former Smoker    Quit date: 11/05/1982  . Smokeless tobacco: Never Used  . Alcohol use No     Allergies   Review of patient's allergies indicates no known allergies.   Review of Systems Review of Systems  Constitutional: Negative for chills and fever.  HENT: Negative for congestion and rhinorrhea.   Eyes: Negative for redness and visual disturbance.  Respiratory: Negative for shortness of breath and wheezing.   Cardiovascular: Negative for chest pain and palpitations.  Gastrointestinal: Negative for nausea and vomiting.  Genitourinary: Negative for dysuria and urgency.  Musculoskeletal: Negative for arthralgias and myalgias.  Skin: Positive for wound. Negative for pallor.  Neurological: Negative for dizziness and headaches.     Physical Exam Updated Vital Signs BP 147/60 (BP Location: Left Arm)   Pulse 82   Temp 99.2 F (37.3  C) (Oral)   Resp 18   SpO2 97%   Physical Exam  Constitutional: She is oriented to person, place, and time. She appears well-developed and well-nourished. No distress.  HENT:  Head: Normocephalic and atraumatic.  Eyes: EOM are normal. Pupils are equal, round, and reactive to light.  Neck: Normal range of motion. Neck supple.  4cm lac to the left temporal region  Cardiovascular: Normal rate and regular rhythm.  Exam reveals no gallop and no friction rub.   No murmur heard. Pulmonary/Chest:  Effort normal. She has no wheezes. She has no rales.  Abdominal: Soft. She exhibits no distension. There is no tenderness.  Musculoskeletal: She exhibits no edema or tenderness.  Neurological: She is alert and oriented to person, place, and time.  Skin: Skin is warm and dry. She is not diaphoretic.  Psychiatric: She has a normal mood and affect. Her behavior is normal.  Nursing note and vitals reviewed.    ED Treatments / Results  Labs (all labs ordered are listed, but only abnormal results are displayed) Labs Reviewed - No data to display  EKG  EKG Interpretation None       Radiology Ct Head Wo Contrast  Result Date: 06/01/2016 CLINICAL DATA:  82 year old female with unwitnessed fall. EXAM: CT HEAD WITHOUT CONTRAST TECHNIQUE: Contiguous axial images were obtained from the base of the skull through the vertex without intravenous contrast. COMPARISON:  Head CT dated 07/18/2015 FINDINGS: There is dilatation of the ventricles out of proportion with the sulci which may represent central volume loss versus normal pressure hydrocephalus. Clinical correlation is recommended. There is no acute intracranial hemorrhage. There is no mass effect or midline shift. There is mild mucoperiosteal thickening of paranasal sinuses. No air-fluid levels. The mastoid air cells are clear. The calvarium is intact. IMPRESSION: No acute intracranial hemorrhage. Age-related atrophy and chronic microvascular ischemic disease. Clinical correlation is recommended to evaluate for NPH. If symptoms persist and there are no contraindications, MRI may provide better evaluation if clinically indicated. Electronically Signed   By: Anner Crete M.D.   On: 06/01/2016 23:03   Procedures .Marland KitchenLaceration Repair Date/Time: 06/01/2016 11:26 PM Performed by: Tyrone Nine Orlondo [place]cross Authorized by: Deno Etienne   Consent:    Consent obtained:  Verbal   Consent given by:  Patient   Risks discussed:  Infection, pain, poor cosmetic result and  poor wound healing   Alternatives discussed:  No treatment Anesthesia (see MAR for exact dosages):    Anesthesia method:  Local infiltration   Local anesthetic:  Lidocaine 1% WITH epi Laceration details:    Location:  Scalp   Scalp location:  L temporal   Length (cm):  4 Repair type:    Repair type:  Simple Exploration:    Wound exploration: entire depth of wound probed and visualized     Contaminated: no   Treatment:    Area cleansed with:  Saline (chlorihexidine)   Amount of cleaning:  Standard   Irrigation solution:  Sterile saline   Irrigation method:  Syringe   Visualized foreign bodies/material removed: no   Skin repair:    Repair method:  Staples   Number of staples:  3 Approximation:    Approximation:  Close   Vermilion border: well-aligned   Post-procedure details:    Dressing:  Open (no dressing)   Patient tolerance of procedure:  Tolerated well, no immediate complications   (including critical care time)  Medications Ordered in ED Medications  lidocaine-EPINEPHrine (XYLOCAINE W/EPI) 1 %-1:100000 (with pres) injection  20 mL (not administered)  Tdap (BOOSTRIX) injection 0.5 mL (0.5 mLs Intramuscular Given 06/01/16 2142)     Initial Impression / Assessment and Plan / ED Course  I have reviewed the triage vital signs and the nursing notes.  Pertinent labs & imaging results that were available during my care of the patient were reviewed by me and considered in my medical decision making (see chart for details).  Clinical Course    82 yo F With a chief complaint of a mechanical fall. Laceration repaired at bedside. CT head negative. No noted neck pain and patient able to move neck without difficulty. Discharge home.  11:28 PM:  I have discussed the diagnosis/risks/treatment options with the patient and family and believe the pt to be eligible for discharge home to follow-up with PCP. We also discussed returning to the ED immediately if new or worsening sx occur.  We discussed the sx which are most concerning (e.g., sudden worsening pain, fever, inability to tolerate by mouth) that necessitate immediate return. Medications administered to the patient during their visit and any new prescriptions provided to the patient are listed below.  Medications given during this visit Medications  lidocaine-EPINEPHrine (XYLOCAINE W/EPI) 1 %-1:100000 (with pres) injection 20 mL (not administered)  Tdap (BOOSTRIX) injection 0.5 mL (0.5 mLs Intramuscular Given 06/01/16 2142)     The patient appears reasonably screen and/or stabilized for discharge and I doubt any other medical condition or other [facility] requiring further screening, evaluation, or treatment in the ED at this time prior to discharge.    Final Clinical Impressions(s) / ED Diagnoses   Final diagnoses:  Head injury, initial encounter  Scalp laceration, initial encounter    New Prescriptions New Prescriptions   No medications on file     Deno Etienne, DO 06/01/16 2328

## 2016-06-01 NOTE — ED Notes (Signed)
Bed: [id number] Expected date:  Expected time:  Means of arrival:  Comments: 82 F/ fall

## 2016-06-01 NOTE — ED Notes (Signed)
Pt states that she ambulates with a walker at home.

## 2016-06-01 NOTE — ED Notes (Signed)
Provider at bedside

## 2016-06-01 NOTE — ED Triage Notes (Signed)
Pt BIB EMS for an unwitnessed fall at home. Pt tripped when going out of front door. Pt hit L side of head on door. Small laceration and hematoma noted on Left side of head above temple.  Denies LOC, dizziness, blurry vision. Endorses headache. A&Ox4.

## 2017-06-08 ENCOUNTER — Emergency Department (HOSPITAL_COMMUNITY)
Admission: EM | Admit: 2017-06-08 | Discharge: 2017-06-09 | Disposition: A | Discharge status: Home / Self Care | Payer: Medicare Other | Attending: Emergency Medicine | Admitting: Emergency Medicine

## 2017-06-08 DIAGNOSIS — F17210 Nicotine dependence, cigarettes, uncomplicated: Secondary | ICD-10-CM | POA: Insufficient documentation

## 2017-06-08 DIAGNOSIS — K319 Disease of stomach and duodenum, unspecified: Secondary | ICD-10-CM | POA: Diagnosis not present

## 2017-06-08 DIAGNOSIS — Z7982 Long term (current) use of aspirin: Secondary | ICD-10-CM | POA: Diagnosis not present

## 2017-06-08 DIAGNOSIS — N3000 Acute cystitis without hematuria: Secondary | ICD-10-CM | POA: Insufficient documentation

## 2017-06-08 DIAGNOSIS — I10 Essential (primary) hypertension: Secondary | ICD-10-CM | POA: Insufficient documentation

## 2017-06-08 DIAGNOSIS — Z79899 Other long term (current) drug therapy: Secondary | ICD-10-CM | POA: Insufficient documentation

## 2017-06-08 DIAGNOSIS — R1030 Lower abdominal pain, unspecified: Secondary | ICD-10-CM | POA: Diagnosis present

## 2017-06-08 DIAGNOSIS — R109 Unspecified abdominal pain: Secondary | ICD-10-CM

## 2017-06-08 DIAGNOSIS — K3189 Other diseases of stomach and duodenum: Secondary | ICD-10-CM

## 2017-06-08 NOTE — ED Triage Notes (Signed)
Pt presents by EMS from home for evaluation of abd pain and constipation for the last 3 days. Pt currently reporting generalized abd pain and left flank pain tenderness.

## 2017-06-09 ENCOUNTER — Emergency Department (HOSPITAL_COMMUNITY): Payer: Medicare Other

## 2017-06-09 ENCOUNTER — Encounter (HOSPITAL_COMMUNITY): Payer: Self-pay | Admitting: Emergency Medicine

## 2017-06-09 DIAGNOSIS — K319 Disease of stomach and duodenum, unspecified: Secondary | ICD-10-CM | POA: Diagnosis not present

## 2017-06-09 LAB — CBC WITH DIFFERENTIAL/PLATELET
BASOS PCT: 1 %
Basophils Absolute: 0 10*3/uL (ref 0.0–0.1)
EOS ABS: 0.2 10*3/uL (ref 0.0–0.7)
Eosinophils Relative: 2 %
HCT: 30.8 % — ABNORMAL LOW (ref 36.0–46.0)
Hemoglobin: 10.2 g/dL — ABNORMAL LOW (ref 12.0–15.0)
LYMPHS ABS: 1 10*3/uL (ref 0.7–4.0)
LYMPHS PCT: 12 %
MCH: 26.4 pg (ref 26.0–34.0)
MCHC: 33.1 g/dL (ref 30.0–36.0)
MCV: 79.8 fL (ref 78.0–100.0)
MONOS PCT: 12 %
Monocytes Absolute: 1.1 10*3/uL — ABNORMAL HIGH (ref 0.1–1.0)
Neutro Abs: 6.3 10*3/uL (ref 1.7–7.7)
Neutrophils Relative %: 73 %
Platelets: 367 10*3/uL (ref 150–400)
RBC: 3.86 MIL/uL — ABNORMAL LOW (ref 3.87–5.11)
RDW: 15 % (ref 11.5–15.5)
WBC: 8.6 10*3/uL (ref 4.0–10.5)

## 2017-06-09 LAB — URINALYSIS, ROUTINE W REFLEX MICROSCOPIC
BILIRUBIN URINE: NEGATIVE
Glucose, UA: NEGATIVE mg/dL
Ketones, ur: NEGATIVE mg/dL
Nitrite: NEGATIVE
Protein, ur: NEGATIVE mg/dL
Specific Gravity, Urine: 1.004 — ABNORMAL LOW (ref 1.005–1.030)
pH: 5 (ref 5.0–8.0)

## 2017-06-09 LAB — COMPREHENSIVE METABOLIC PANEL
ALBUMIN: 3.5 g/dL (ref 3.5–5.0)
ALT: 7 U/L — AB (ref 14–54)
AST: 21 U/L (ref 15–41)
Alkaline Phosphatase: 84 U/L (ref 38–126)
Anion gap: 10 (ref 5–15)
BILIRUBIN TOTAL: 0.4 mg/dL (ref 0.3–1.2)
BUN: 23 mg/dL — ABNORMAL HIGH (ref 6–20)
CHLORIDE: 101 mmol/L (ref 101–111)
CO2: 25 mmol/L (ref 22–32)
CREATININE: 1.49 mg/dL — AB (ref 0.44–1.00)
Calcium: 9.1 mg/dL (ref 8.9–10.3)
GFR calc Af Amer: 35 mL/min — ABNORMAL LOW (ref >60)
GFR, EST NON AFRICAN AMERICAN: 30 mL/min — AB (ref >60)
GLUCOSE: 108 mg/dL — AB (ref 65–99)
POTASSIUM: 3.4 mmol/L — AB (ref 3.5–5.1)
Sodium: 136 mmol/L (ref 135–145)
TOTAL PROTEIN: 7.4 g/dL (ref 6.5–8.1)

## 2017-06-09 LAB — I-STAT CG4 LACTIC ACID, ED: Lactic Acid, Venous: 1.32 mmol/L (ref 0.5–1.9)

## 2017-06-09 MED ORDER — CEPHALEXIN 500 MG PO CAPS: 500.0000 mg | ORAL_CAPSULE | Freq: Three times a day (TID) | ORAL | 0 refills | Status: DC

## 2017-06-09 MED ORDER — IOPAMIDOL (ISOVUE-300) INJECTION 61%
80.0000 mL | Freq: Once | INTRAVENOUS | Status: AC | PRN
  Administered 2017-06-09: 80 mL via INTRAVENOUS

## 2017-06-09 MED ORDER — FENTANYL CITRATE (PF) 100 MCG/2ML IJ SOLN: 25.0000 ug | Freq: Once | INTRAMUSCULAR | Status: DC

## 2017-06-09 MED ORDER — PHENAZOPYRIDINE HCL 200 MG PO TABS: 200.0000 mg | ORAL_TABLET | Freq: Three times a day (TID) | ORAL | 0 refills | Status: DC

## 2017-06-09 MED ORDER — IOPAMIDOL (ISOVUE-300) INJECTION 61%
INTRAVENOUS | Status: AC
  Filled 2017-06-09: qty 100

## 2017-06-09 MED ORDER — IOPAMIDOL (ISOVUE-300) INJECTION 61%: 100.0000 mL | Freq: Once | INTRAVENOUS | Status: DC | PRN

## 2017-06-09 MED ORDER — SODIUM CHLORIDE 0.9 % IJ SOLN
INTRAMUSCULAR | Status: DC
Start: 2017-06-09 — End: 2017-06-09
  Filled 2017-06-09: qty 50

## 2017-06-09 MED ORDER — CEFTRIAXONE SODIUM 1 G IJ SOLR
1.0000 g | Freq: Once | INTRAMUSCULAR | Status: AC
  Administered 2017-06-09: 1 g via INTRAVENOUS
  Filled 2017-06-09: qty 10

## 2017-06-09 MED ORDER — SODIUM CHLORIDE 0.9 % IV BOLUS (SEPSIS)
1000.0000 mL | Freq: Once | INTRAVENOUS | Status: AC
  Administered 2017-06-09: 1000 mL via INTRAVENOUS

## 2017-06-09 NOTE — ED Provider Notes (Signed)
[place] DEPT Provider Note   CSN: [id number] Arrival date & time: 06/08/17  2344  Time seen 23:55 PM    History   Chief Complaint Chief Complaint  Patient presents with  . Abdominal Pain    HPI Shannon Cruz is a 83 y.o. female.  HPI  patient reports she has been having abdominal pain off and on for the past 2-3 weeks. She states it typically lasts a few minutes at a time. She indicates her lower abdomen is usually where it's located. She states her urine has been feeling warmer than usual. She states this time the pain is then present the last 3 days. She denies nausea, vomiting, diarrhea, or loss of appetite. She denies fever or chills. She states she's never had this pain before. She has had a C-section in the past.  PCP Dixie Dials, MD   Past Medical History:  Diagnosis Date  . Arthritis   . H/O diastolic dysfunction   . Heart palpitations   . Hypertension   . RBBB (right bundle branch block) 11/20/2010   Echo - EF >55%; flow pattern suggestive of impaired LV relaxation, proximal septal thickening noted; mitral valve mildly thickened; mild mitral annular calcification; aortic root sclerosis/calcification    Patient Active Problem List   Diagnosis Date Noted  . Atrial bigeminy 10/09/2013  . HTN (hypertension) 03/19/2013  . RBBB (right bundle branch block) 03/19/2013  . Left shoulder pain 03/19/2013    History reviewed. No pertinent surgical history.  OB History    No data available       Home Medications    Prior to Admission medications   Medication Sig Start Date End Date Taking? Authorizing Provider  amLODipine (NORVASC) 5 MG tablet Take 5 mg by mouth daily.   Yes [provider]  aspirin 81 MG chewable tablet Chew 81 mg by mouth daily.   Yes [provider]  cycloSPORINE (RESTASIS) 0.05 % ophthalmic emulsion Place 1 drop into both eyes 2 (two) times daily.   Yes [provider]  ferrous sulfate 325 (65 FE) MG tablet  Take 325 mg by mouth daily with breakfast.   Yes [provider]  nitroGLYCERIN (NITROSTAT) 0.4 MG SL tablet Place 0.4 mg under the tongue every 5 (five) minutes as needed for chest pain.   Yes [provider]  cephALEXin (KEFLEX) 500 MG capsule Take 1 capsule (500 mg total) by mouth 3 (three) times daily. 06/09/17   Rolland Porter, MD  phenazopyridine (PYRIDIUM) 200 MG tablet Take 1 tablet (200 mg total) by mouth 3 (three) times daily. 06/09/17   Rolland Porter, MD    Family History History reviewed. No pertinent family history.  Social History Social History  Substance Use Topics  . Smoking status: Former Smoker    Quit date: 11/05/1982  . Smokeless tobacco: Never Used  . Alcohol use No  lives at home  Lives alone Uses a cane when goes out from her house   Allergies   Patient has no known allergies.   Review of Systems Review of Systems  All other systems reviewed and are negative.    Physical Exam Updated Vital Signs BP (!) 149/65 (BP Location: Right Arm)   Pulse 89   Temp 100.3 F (37.9 C) (Oral)   Resp 16   Ht 5\' 6"  (1.676 m)   Wt 56.7 kg (125 lb)   SpO2 99%   BMI 20.18 kg/m   Vital signs normal    Physical Exam  Constitutional: She  is oriented to person, place, and time. She appears well-developed and well-nourished.  Non-toxic appearance. She does not appear ill. No distress.  HENT:  Head: Normocephalic and atraumatic.  Right Ear: External ear normal.  Left Ear: External ear normal.  Nose: Nose normal. No mucosal edema or rhinorrhea.  Mouth/Throat: Oropharynx is clear and moist and mucous membranes are normal. No dental abscesses or uvula swelling.  edentulous  Eyes: Pupils are equal, round, and reactive to light. Conjunctivae and EOM are normal.  Neck: Normal range of motion and full passive range of motion without pain. Neck supple.  Cardiovascular: Normal rate, regular rhythm and normal heart sounds.  Exam reveals no gallop and no friction rub.    No murmur heard. Pulmonary/Chest: Effort normal and breath sounds normal. No respiratory distress. She has no wheezes. She has no rhonchi. She has no rales. She exhibits no tenderness and no crepitus.  Abdominal: Soft. Normal appearance and bowel sounds are normal. She exhibits no distension. There is tenderness. There is no rebound and no guarding.    Musculoskeletal: Normal range of motion. She exhibits no edema or tenderness.  Moves all extremities well.   Neurological: She is alert and oriented to person, place, and time. She has normal strength. No cranial nerve deficit.  Skin: Skin is warm, dry and intact. No rash noted. No erythema. No pallor.  Psychiatric: She has a normal mood and affect. Her speech is normal and behavior is normal. Her mood appears not anxious.  Nursing note and vitals reviewed.    ED Treatments / Results  Labs (all labs ordered are listed, but only abnormal results are displayed) Results for orders placed or performed during the hospital encounter of 06/08/17  Comprehensive metabolic panel  Result Value Ref Range   Sodium 136 135 - 145 mmol/L   Potassium 3.4 (L) 3.5 - 5.1 mmol/L   Chloride 101 101 - 111 mmol/L   CO2 25 22 - 32 mmol/L   Glucose, Bld 108 (H) 65 - 99 mg/dL   BUN 23 (H) 6 - 20 mg/dL   Creatinine, Ser 1.49 (H) 0.44 - 1.00 mg/dL   Calcium 9.1 8.9 - 10.3 mg/dL   Total Protein 7.4 6.5 - 8.1 g/dL   Albumin 3.5 3.5 - 5.0 g/dL   AST 21 15 - 41 U/L   ALT 7 (L) 14 - 54 U/L   Alkaline Phosphatase 84 38 - 126 U/L   Total Bilirubin 0.4 0.3 - 1.2 mg/dL   GFR calc non Af Amer 30 (L) >60 mL/min   GFR calc Af Amer 35 (L) >60 mL/min   Anion gap 10 5 - 15  CBC with Differential  Result Value Ref Range   WBC 8.6 4.0 - 10.5 K/uL   RBC 3.86 (L) 3.87 - 5.11 MIL/uL   Hemoglobin 10.2 (L) 12.0 - 15.0 g/dL   HCT 30.8 (L) 36.0 - 46.0 %   MCV 79.8 78.0 - 100.0 fL   MCH 26.4 26.0 - 34.0 pg   MCHC 33.1 30.0 - 36.0 g/dL   RDW 15.0 11.5 - 15.5 %   Platelets  367 150 - 400 K/uL   Neutrophils Relative % 73 %   Neutro Abs 6.3 1.7 - 7.7 K/uL   Lymphocytes Relative 12 %   Lymphs Abs 1.0 0.7 - 4.0 K/uL   Monocytes Relative 12 %   Monocytes Absolute 1.1 (H) 0.1 - 1.0 K/uL   Eosinophils Relative 2 %   Eosinophils Absolute 0.2 0.0 - 0.7  K/uL   Basophils Relative 1 %   Basophils Absolute 0.0 0.0 - 0.1 K/uL  Urinalysis, Routine w reflex microscopic  Result Value Ref Range   Color, Urine YELLOW YELLOW   APPearance CLEAR CLEAR   Specific Gravity, Urine 1.004 (L) 1.005 - 1.030   pH 5.0 5.0 - 8.0   Glucose, UA NEGATIVE NEGATIVE mg/dL   Hgb urine dipstick SMALL (A) NEGATIVE   Bilirubin Urine NEGATIVE NEGATIVE   Ketones, ur NEGATIVE NEGATIVE mg/dL   Protein, ur NEGATIVE NEGATIVE mg/dL   Nitrite NEGATIVE NEGATIVE   Leukocytes, UA LARGE (A) NEGATIVE   RBC / HPF 0-5 0 - 5 RBC/hpf   WBC, UA 6-30 0 - 5 WBC/hpf   Bacteria, UA RARE (A) NONE SEEN   Squamous Epithelial / LPF 0-5 (A) NONE SEEN   Mucous PRESENT   I-Stat CG4 Lactic Acid, ED  Result Value Ref Range   Lactic Acid, Venous 1.32 0.5 - 1.9 mmol/L   Laboratory interpretation all normal except Stable anemia, stable renal insufficiency, possible UTI    EKG  EKG Interpretation None       Radiology Ct Abdomen Pelvis W Contrast  Result Date: 06/09/2017 CLINICAL DATA:  83 year old female with abdominal pain. Left flank pain. EXAM: CT ABDOMEN AND PELVIS WITH CONTRAST TECHNIQUE: Multidetector CT imaging of the abdomen and pelvis was performed using the standard protocol following bolus administration of intravenous contrast. CONTRAST:  64mL ISOVUE-300 IOPAMIDOL (ISOVUE-300) INJECTION 61% COMPARISON:  Abdominal radiograph dated 04/05/2016 and CT dated 01/24/2015 FINDINGS: Lower chest: The visualized lung bases are clear. There is mild cardiomegaly. Partially visualized small pericardial effusion measuring 9 mm in thickness. No intra-abdominal free air or free fluid. Hepatobiliary: There is mild  irregularity of the hepatic contour concerning for early changes of cirrhosis. Clinical correlation is recommended. The gallbladder is unremarkable. Pancreas: Unremarkable. No pancreatic ductal dilatation or surrounding inflammatory changes. Spleen: Normal in size without focal abnormality. Adrenals/Urinary Tract: The adrenal glands are unremarkable. Mild bilateral renal parenchyma atrophy. There is a 2.7 cm left renal cyst. No hydronephrosis on either side. There is symmetric enhancement and excretion of contrast by both kidneys. The visualized ureters and urinary bladder appear unremarkable. Stomach/Bowel: There is thickening of the lesser curvature of the distal stomach. There is a 4.9 x 3.1 cm solid-appearing mass (previously 3.5 x 3.3 cm) along the lesser curvature of the stomach. Differential includes a gastrointestinal stromal tumor, sarcoma, a leiomyoma, or a neurogenic origin tumor. Other etiologies including lymphoma is not excluded. Further evaluation with endoscopy and further characterization with abdominal MRI recommended. There is no bowel obstruction. There is extensive sigmoid diverticulosis with muscular hypertrophy. There are scattered colonic diverticula. There is rim calcification of a proximal colonic diverticula without active inflammatory changes. Moderate amount of stool noted throughout the colon. No bowel obstruction or active inflammation. Normal appendix. Vascular/Lymphatic: There is advanced aortoiliac atherosclerotic disease. There is a 2.6 cm infrarenal aortic ectasia. The origins of the celiac axis, SMA, IMA and the renal arteries are patent. The SMV, splenic vein, and main portal vein are patent. No portal venous gas identified. There is no adenopathy. Reproductive: Small calcified uterine fibroids.  No pelvic mass. Other: Midline vertical anterior pelvic wall incisional scar. Musculoskeletal: There is osteopenia with multilevel degenerative changes of the spine. Severe right hip  osteoarthritis with chronic appearing synovial thickening and fluid in the ileo psoas and greater trochanteric bursa. No acute fracture. IMPRESSION: 1. Extensive colonic diverticulosis without active inflammatory changes. No bowel obstruction. Normal appendix.  2. Solid exophytic mass along the lesser curvature of the stomach may represent GIST, sarcoma, leiomyoma, or a neurogenic tumor origin. Other etiologies including lymphoma are not entirely excluded. Further evaluation with endoscopy and further characterization with MRI recommended. No adenopathy or evidence of metastatic disease. 3. Advanced aortoiliac atherosclerotic disease. Aortic Atherosclerosis (ICD10-I70.0). 4. Advanced osteoarthritic changes of the right hip with chronic fluid within the ileo psoas and greater trochanteric bursa. No acute fracture. Electronically Signed   By: Anner Crete M.D.   On: 06/09/2017 02:17    Procedures Procedures (including critical care time)  Medications Ordered in ED Medications  fentaNYL (SUBLIMAZE) injection 25 mcg (25 mcg Intravenous Refused 06/09/17 0051)  iopamidol (ISOVUE-300) 61 % injection (not administered)  sodium chloride 0.9 % injection (not administered)  sodium chloride 0.9 % bolus 1,000 mL (0 mLs Intravenous Stopped 06/09/17 0133)  iopamidol (ISOVUE-300) 61 % injection 80 mL (80 mLs Intravenous Contrast Given 06/09/17 0144)  cefTRIAXone (ROCEPHIN) 1 g in dextrose 5 % 50 mL IVPB (1 g Intravenous New Bag/Given 06/09/17 0354)     Initial Impression / Assessment and Plan / ED Course  I have reviewed the triage vital signs and the nursing notes.  Pertinent labs & imaging results that were available during my care of the patient were reviewed by me and considered in my medical decision making (see chart for details).    Laboratory testing and CT scan was done to evaluate her abdominal pain. Patient was given IV fluids and IV pain medication.  After reviewing her urinalysis patient was given  IV Rocephin for possible UTI. On her exam she was tender to palpation over the bladder. Hopefully the UTI is the source of her fever.  At time of discharge she states her pain is gone. We discussed her CT results. She denies having early fullness when she eats or any discomfort with eating. We discussed the need to follow-up with a gastroenterologist for further evaluation of this mass in her stomach. Patient and her son seemed to understand.  Final Clinical Impressions(s) / ED Diagnoses   Final diagnoses:  Abdominal pain, unspecified abdominal location  Mass of stomach  Acute cystitis without hematuria    New Prescriptions New Prescriptions   CEPHALEXIN (KEFLEX) 500 MG CAPSULE    Take 1 capsule (500 mg total) by mouth 3 (three) times daily.   PHENAZOPYRIDINE (PYRIDIUM) 200 MG TABLET    Take 1 tablet (200 mg total) by mouth 3 (three) times daily.    Plan discharge  Rolland Porter, MD, Barbette Or, MD 06/09/17 [phone]

## 2017-06-09 NOTE — Discharge Instructions (Signed)
Take the antibiotic until gone, take the Pyridium for discomfort when urinating. You have a mass in your stomach that needs further evaluation. Please call Dr. Ulyses Amor office to get a appointment to evaluate this further. Return to the emergency department if you get worse such as uncontrolled vomiting, worsening pain, or high fever.

## 2017-06-10 LAB — URINE CULTURE: Culture: <10000 — AB

## 2017-06-14 LAB — CULTURE, BLOOD (ROUTINE X 2)
CULTURE: NO GROWTH
Culture: NO GROWTH
Special Requests: ADEQUATE
Special Requests: ADEQUATE

## 2017-06-19 ENCOUNTER — Emergency Department (HOSPITAL_COMMUNITY): Payer: Medicare Other

## 2017-06-19 ENCOUNTER — Encounter (HOSPITAL_COMMUNITY): Payer: Self-pay

## 2017-06-19 ENCOUNTER — Emergency Department (HOSPITAL_COMMUNITY)
Admission: EM | Admit: 2017-06-19 | Discharge: 2017-06-20 | Disposition: A | Discharge status: Home / Self Care | Payer: Medicare Other | Attending: Emergency Medicine | Admitting: Emergency Medicine

## 2017-06-19 DIAGNOSIS — Z79899 Other long term (current) drug therapy: Secondary | ICD-10-CM | POA: Diagnosis not present

## 2017-06-19 DIAGNOSIS — R1030 Lower abdominal pain, unspecified: Secondary | ICD-10-CM | POA: Diagnosis present

## 2017-06-19 DIAGNOSIS — Z87891 Personal history of nicotine dependence: Secondary | ICD-10-CM | POA: Diagnosis not present

## 2017-06-19 DIAGNOSIS — I10 Essential (primary) hypertension: Secondary | ICD-10-CM | POA: Diagnosis not present

## 2017-06-19 DIAGNOSIS — R1032 Left lower quadrant pain: Secondary | ICD-10-CM | POA: Insufficient documentation

## 2017-06-19 LAB — COMPREHENSIVE METABOLIC PANEL
ALBUMIN: 3.5 g/dL (ref 3.5–5.0)
ALK PHOS: 229 U/L — AB (ref 38–126)
ALT: 24 U/L (ref 14–54)
AST: 44 U/L — AB (ref 15–41)
Anion gap: 12 (ref 5–15)
BILIRUBIN TOTAL: 0.7 mg/dL (ref 0.3–1.2)
BUN: 27 mg/dL — AB (ref 6–20)
CALCIUM: 9.4 mg/dL (ref 8.9–10.3)
CO2: 24 mmol/L (ref 22–32)
Chloride: 98 mmol/L — ABNORMAL LOW (ref 101–111)
Creatinine, Ser: 1.55 mg/dL — ABNORMAL HIGH (ref 0.44–1.00)
GFR calc Af Amer: 33 mL/min — ABNORMAL LOW (ref >60)
GFR calc non Af Amer: 29 mL/min — ABNORMAL LOW (ref >60)
GLUCOSE: 115 mg/dL — AB (ref 65–99)
Potassium: 4.1 mmol/L (ref 3.5–5.1)
Sodium: 134 mmol/L — ABNORMAL LOW (ref 135–145)
TOTAL PROTEIN: 7.5 g/dL (ref 6.5–8.1)

## 2017-06-19 LAB — CBC WITH DIFFERENTIAL/PLATELET
Basophils Absolute: 0 10*3/uL (ref 0.0–0.1)
Basophils Relative: 0 %
EOS PCT: 0 %
Eosinophils Absolute: 0 10*3/uL (ref 0.0–0.7)
HCT: 27.4 % — ABNORMAL LOW (ref 36.0–46.0)
Hemoglobin: 9.1 g/dL — ABNORMAL LOW (ref 12.0–15.0)
LYMPHS PCT: 5 %
Lymphs Abs: 0.5 10*3/uL — ABNORMAL LOW (ref 0.7–4.0)
MCH: 26.5 pg (ref 26.0–34.0)
MCHC: 33.2 g/dL (ref 30.0–36.0)
MCV: 79.7 fL (ref 78.0–100.0)
MONO ABS: 1.2 10*3/uL — AB (ref 0.1–1.0)
Monocytes Relative: 12 %
Neutro Abs: 8.7 10*3/uL — ABNORMAL HIGH (ref 1.7–7.7)
Neutrophils Relative %: 83 %
PLATELETS: 482 10*3/uL — AB (ref 150–400)
RBC: 3.44 MIL/uL — ABNORMAL LOW (ref 3.87–5.11)
RDW: 15.2 % (ref 11.5–15.5)
WBC: 10.5 10*3/uL (ref 4.0–10.5)

## 2017-06-19 LAB — LIPASE, BLOOD: Lipase: 30 U/L (ref 11–51)

## 2017-06-19 LAB — I-STAT CG4 LACTIC ACID, ED: Lactic Acid, Venous: 2.06 mmol/L (ref 0.5–1.9)

## 2017-06-19 MED ORDER — VANCOMYCIN HCL IN DEXTROSE 1-5 GM/200ML-% IV SOLN
1000.0000 mg | Freq: Once | INTRAVENOUS | Status: AC
  Administered 2017-06-20: 1000 mg via INTRAVENOUS
  Filled 2017-06-19: qty 200

## 2017-06-19 MED ORDER — SODIUM CHLORIDE 0.9 % IV BOLUS (SEPSIS)
1000.0000 mL | Freq: Once | INTRAVENOUS | Status: AC
  Administered 2017-06-19: 1000 mL via INTRAVENOUS

## 2017-06-19 MED ORDER — ACETAMINOPHEN 500 MG PO TABS
1000.0000 mg | ORAL_TABLET | Freq: Once | ORAL | Status: AC
  Administered 2017-06-19: 1000 mg via ORAL
  Filled 2017-06-19: qty 2

## 2017-06-19 MED ORDER — PIPERACILLIN-TAZOBACTAM 3.375 G IVPB 30 MIN
3.3750 g | Freq: Once | INTRAVENOUS | Status: AC
  Administered 2017-06-20: 3.375 g via INTRAVENOUS
  Filled 2017-06-19: qty 50

## 2017-06-19 NOTE — ED Notes (Signed)
EKG given to EDP,Nanavati,MD., for review. 

## 2017-06-19 NOTE — ED Notes (Signed)
Below order not completed by EW. 

## 2017-06-19 NOTE — ED Provider Notes (Signed)
[facility] DEPT Provider Note   CSN: [id number] Arrival date & time: 06/19/17  2121     History   Chief Complaint Chief Complaint  Patient presents with  . Abdominal Pain    HPI Shannon Cruz is a 83 y.o. female.  Patient presents to the ED with a chief complaint of abdominal pain.  She states that she has been having abdominal pain for the past couple of weeks.  She was seen about 10 days ago for the same and was seen here.  She had a CT scan which showed a mass in her lower abdomen.  She was encouraged to follow-up with PCP and GI regarding this, but has not been able to do so yet.  She reports that she has had some worsening pain and nausea today.  She is accompanied by her son.  She denies any fevers, chills, chest pain, SOB, vomiting, diarrhea, or dysuria.  Denies any changes in bowel or bladder habits.   The history is provided by the patient. No language interpreter was used.    Past Medical History:  Diagnosis Date  . Arthritis   . H/O diastolic dysfunction   . Heart palpitations   . Hypertension   . RBBB (right bundle branch block) 11/20/2010   Echo - EF >55%; flow pattern suggestive of impaired LV relaxation, proximal septal thickening noted; mitral valve mildly thickened; mild mitral annular calcification; aortic root sclerosis/calcification    Patient Active Problem List   Diagnosis Date Noted  . Atrial bigeminy 10/09/2013  . HTN (hypertension) 03/19/2013  . RBBB (right bundle branch block) 03/19/2013  . Left shoulder pain 03/19/2013    History reviewed. No pertinent surgical history.  OB History    No data available       Home Medications    Prior to Admission medications   Medication Sig Start Date End Date Taking? Authorizing Provider  amLODipine (NORVASC) 5 MG tablet Take 5 mg by mouth daily.   Yes [provider]  aspirin 81 MG chewable tablet Chew 81 mg by mouth daily.   Yes [provider]  cephALEXin (KEFLEX) 500 MG  capsule Take 1 capsule (500 mg total) by mouth 3 (three) times daily. Patient not taking: Reported on 06/19/2017 06/09/17   Rolland Porter, MD  cycloSPORINE (RESTASIS) 0.05 % ophthalmic emulsion Place 1 drop into both eyes 2 (two) times daily.    [provider]  ferrous sulfate 325 (65 FE) MG tablet Take 325 mg by mouth daily with breakfast.    [provider]  nitroGLYCERIN (NITROSTAT) 0.4 MG SL tablet Place 0.4 mg under the tongue every 5 (five) minutes as needed for chest pain.    [provider]  phenazopyridine (PYRIDIUM) 200 MG tablet Take 1 tablet (200 mg total) by mouth 3 (three) times daily. Patient not taking: Reported on 06/19/2017 06/09/17   Rolland Porter, MD    Family History History reviewed. No pertinent family history.  Social History Social History  Substance Use Topics  . Smoking status: Former Smoker    Quit date: 11/05/1982  . Smokeless tobacco: Never Used  . Alcohol use No     Allergies   Patient has no known allergies.   Review of Systems Review of Systems  All other systems reviewed and are negative.    Physical Exam Updated Vital Signs BP (!) 152/71 (BP Location: Left Arm)   Pulse (!) 101   Temp 99.6 F (37.6 C) (Oral)   SpO2 97%   Physical  Exam  Constitutional: She is oriented to person, place, and time. She appears well-developed and well-nourished.  HENT:  Head: Normocephalic and atraumatic.  Eyes: Pupils are equal, round, and reactive to light. Conjunctivae and EOM are normal.  Neck: Normal range of motion. Neck supple.  Cardiovascular: Normal rate and regular rhythm.  Exam reveals no gallop and no friction rub.   No murmur heard. Pulmonary/Chest: Effort normal and breath sounds normal. No respiratory distress. She has no wheezes. She has no rales. She exhibits no tenderness.  Abdominal: Soft. Bowel sounds are normal. She exhibits no distension and no mass. There is no tenderness. There is no rebound and no guarding.  Lower  abdominal discomfort  Musculoskeletal: Normal range of motion. She exhibits no edema or tenderness.  Neurological: She is alert and oriented to person, place, and time.  Skin: Skin is warm and dry.  Psychiatric: She has a normal mood and affect. Her behavior is normal. Judgment and thought content normal.  Nursing note and vitals reviewed.    ED Treatments / Results  Labs (all labs ordered are listed, but only abnormal results are displayed) Labs Reviewed  CBC WITH DIFFERENTIAL/PLATELET - Abnormal; Notable for the following:       Result Value   RBC 3.44 (*)    Hemoglobin 9.1 (*)    HCT 27.4 (*)    Platelets 482 (*)    Neutro Abs 8.7 (*)    Lymphs Abs 0.5 (*)    Monocytes Absolute 1.2 (*)    All other components within normal limits  COMPREHENSIVE METABOLIC PANEL - Abnormal; Notable for the following:    Sodium 134 (*)    Chloride 98 (*)    Glucose, Bld 115 (*)    BUN 27 (*)    Creatinine, Ser 1.55 (*)    AST 44 (*)    Alkaline Phosphatase 229 (*)    GFR calc non Af Amer 29 (*)    GFR calc Af Amer 33 (*)    All other components within normal limits  URINALYSIS, ROUTINE W REFLEX MICROSCOPIC - Abnormal; Notable for the following:    APPearance HAZY (*)    Hgb urine dipstick SMALL (*)    Protein, ur 100 (*)    Leukocytes, UA MODERATE (*)    Bacteria, UA RARE (*)    Squamous Epithelial / LPF 0-5 (*)    All other components within normal limits  I-STAT CG4 LACTIC ACID, ED - Abnormal; Notable for the following:    Lactic Acid, Venous 2.06 (*)    All other components within normal limits  CULTURE, BLOOD (ROUTINE X 2)  CULTURE, BLOOD (ROUTINE X 2)  LIPASE, BLOOD  I-STAT CG4 LACTIC ACID, ED    EKG  EKG Interpretation None       Radiology Ct Abdomen Pelvis W Contrast  Result Date: 06/20/2017 CLINICAL DATA:  Acute onset of lower abdominal pain and nausea. Initial encounter. EXAM: CT ABDOMEN AND PELVIS WITH CONTRAST TECHNIQUE: Multidetector CT imaging of the abdomen  and pelvis was performed using the standard protocol following bolus administration of intravenous contrast. CONTRAST:  96mL ISOVUE-300 IOPAMIDOL (ISOVUE-300) INJECTION 61% COMPARISON:  CT of the abdomen and pelvis performed 06/09/2017 FINDINGS: Lower chest: Small bilateral pleural effusions are noted. A moderate pericardial effusion is noted, increased from the recent prior study. Apparent associated peripheral enhancement is noted. Would correlate clinically for pericarditis. Hepatobiliary: Vaguely decreased enhancement is noted within much of the liver. This may reflect venous congestion. Prominent pericholecystic fluid  is noted, raising question for acute cholecystitis. The common bile duct remains normal in caliber. Pancreas: The pancreas is within normal limits. Spleen: The spleen is unremarkable in appearance. Adrenals/Urinary Tract: The adrenal glands are unremarkable in appearance. A left renal cyst is noted. There is no evidence of hydronephrosis. No renal or ureteral stones are identified. No significant perinephric stranding is seen. Stomach/Bowel: The stomach is unremarkable in appearance. The small bowel is within normal limits. The appendix is normal in caliber, without evidence of appendicitis. Scattered diverticulosis is noted along the sigmoid colon. An apparent calcified epiploic appendage is noted at the right lower quadrant. Vascular/Lymphatic: Diffuse calcification is seen along the abdominal aorta and its branches. There is ectasia of the infrarenal aorta to 2.9 cm in AP dimension. The inferior vena cava is grossly unremarkable. No retroperitoneal lymphadenopathy is seen. No pelvic sidewall lymphadenopathy is identified. Reproductive: The bladder is decompressed and not well characterized. The uterus is grossly unremarkable. The ovaries are relatively symmetric. No suspicious adnexal masses are seen. Other: A small amount of free fluid within the pelvis. Musculoskeletal: No acute osseous  abnormalities are identified. Facet disease is noted at the lower lumbar spine. The visualized musculature is unremarkable in appearance. IMPRESSION: 1. Moderate pericardial effusion is increased from the recent prior study. Apparent associated peripheral enhancement noted. Would correlate clinically to help exclude pericarditis. 2. Prominent pericholecystic fluid is of uncertain significance, but raises question for acute cholecystitis. Would correlate with LFTs. 3. Vaguely decreased enhancement within much of the liver may reflect venous congestion. 4. Small bilateral pleural effusions noted. 5. Left renal cyst. 6. Scattered diverticulosis along the sigmoid colon. 7. Diffuse aortic atherosclerosis. Ectatic abdominal aorta at risk for aneurysm development. Recommend followup by ultrasound in 5 years. This recommendation follows ACR consensus guidelines: White Paper of the ACR Incidental Findings Committee II on Vascular Findings. J Am Coll Radiol 2013; 10:789-794. 8. Small amount of free fluid within the pelvis. Electronically Signed   By: Garald Balding M.D.   On: 06/20/2017 00:53    Procedures Procedures (including critical care time)  Medications Ordered in ED Medications - No data to display   Initial Impression / Assessment and Plan / ED Course  I have reviewed the triage vital signs and the nursing notes.  Pertinent labs & imaging results that were available during my care of the patient were reviewed by me and considered in my medical decision making (see chart for details).      Patient with abdominal pain 2 weeks. Patient seen recently for the same, and had CT which showed abdominal mass. She is presenting with worsening pain.  Will check labs, and will repeat imaging given the recent CT findings.  CT shows moderate pericardial effusion, but patient denies any chest pain or shortness of breath. CT also shows some pericholecystic fluid, but she has no right upper quadrant tenderness. I  have a low suspicion for cholecystitis. The majority of her pain is in her left lower quadrant. And this has resolved.  4:06 AM Patient seen by and discussed with Dr. Kathrynn Humble.  Labs and imaging reviewed with him.  Recommends discharge with close follow-up by GI and PCP.  Patient is pain free. VSS.  Will need to follow-up with GI.  Final Clinical Impressions(s) / ED Diagnoses   Final diagnoses:  Left lower quadrant pain    New Prescriptions New Prescriptions   No medications on file     Montine Circle, Hershal Coria 06/20/17 [place], Ankit,  MD 06/24/17 [id number]

## 2017-06-19 NOTE — ED Triage Notes (Signed)
Pt brought in by EMS from Home. Pt c/o abdominal and lower pain and nausea. Per E MS no vomiting is observed pt was spitting up.  Pt was recently seen by PCP and reports  she was dx with a mass to lower abdomen   and a UTI and .    BP 138/62 HR 115, RR 20,

## 2017-06-20 DIAGNOSIS — R1032 Left lower quadrant pain: Secondary | ICD-10-CM | POA: Diagnosis not present

## 2017-06-20 LAB — URINALYSIS, ROUTINE W REFLEX MICROSCOPIC
Bilirubin Urine: NEGATIVE
GLUCOSE, UA: NEGATIVE mg/dL
Ketones, ur: NEGATIVE mg/dL
Nitrite: NEGATIVE
Protein, ur: 100 mg/dL — AB
Specific Gravity, Urine: 1.016 (ref 1.005–1.030)
pH: 5 (ref 5.0–8.0)

## 2017-06-20 LAB — I-STAT CG4 LACTIC ACID, ED: LACTIC ACID, VENOUS: 1.02 mmol/L (ref 0.5–1.9)

## 2017-06-20 MED ORDER — IOPAMIDOL (ISOVUE-300) INJECTION 61%
INTRAVENOUS | Status: AC
  Administered 2017-06-20: 80 mL
  Filled 2017-06-20: qty 100

## 2017-06-20 NOTE — Discharge Instructions (Signed)
You need to follow-up with the gastroenterologist regarding the tumor in your abdomen.  Please contact them today.  Return to the ER if your symptoms worsen.

## 2017-06-23 ENCOUNTER — Telehealth (HOSPITAL_BASED_OUTPATIENT_CLINIC_OR_DEPARTMENT_OTHER): Payer: Self-pay | Admitting: *Deleted

## 2017-06-23 LAB — BLOOD CULTURE ID PANEL (REFLEXED)
ACINETOBACTER BAUMANNII: NOT DETECTED
CANDIDA ALBICANS: NOT DETECTED
CANDIDA GLABRATA: NOT DETECTED
CANDIDA KRUSEI: NOT DETECTED
Candida parapsilosis: NOT DETECTED
Candida tropicalis: NOT DETECTED
Carbapenem resistance: NOT DETECTED
ENTEROBACTER CLOACAE COMPLEX: NOT DETECTED
ESCHERICHIA COLI: NOT DETECTED
Enterobacteriaceae species: NOT DETECTED
Enterococcus species: NOT DETECTED
Haemophilus influenzae: NOT DETECTED
KLEBSIELLA OXYTOCA: NOT DETECTED
Klebsiella pneumoniae: NOT DETECTED
LISTERIA MONOCYTOGENES: NOT DETECTED
Methicillin resistance: NOT DETECTED
NEISSERIA MENINGITIDIS: NOT DETECTED
Proteus species: NOT DETECTED
Pseudomonas aeruginosa: NOT DETECTED
SERRATIA MARCESCENS: NOT DETECTED
STREPTOCOCCUS AGALACTIAE: NOT DETECTED
STREPTOCOCCUS PNEUMONIAE: NOT DETECTED
STREPTOCOCCUS SPECIES: NOT DETECTED
Staphylococcus aureus (BCID): NOT DETECTED
Staphylococcus species: NOT DETECTED
Streptococcus pyogenes: NOT DETECTED
Vancomycin resistance: NOT DETECTED

## 2017-06-24 ENCOUNTER — Observation Stay (HOSPITAL_COMMUNITY)
Admission: AD | Admit: 2017-06-24 | Discharge: 2017-06-26 | Disposition: A | Discharge status: Home / Self Care | Payer: Medicare Other | Source: Ambulatory Visit | Attending: Cardiovascular Disease | Admitting: Cardiovascular Disease

## 2017-06-24 DIAGNOSIS — E440 Moderate protein-calorie malnutrition: Secondary | ICD-10-CM | POA: Insufficient documentation

## 2017-06-24 DIAGNOSIS — R000 Tachycardia, unspecified: Secondary | ICD-10-CM | POA: Diagnosis not present

## 2017-06-24 DIAGNOSIS — I4510 Unspecified right bundle-branch block: Secondary | ICD-10-CM | POA: Diagnosis not present

## 2017-06-24 DIAGNOSIS — Z7982 Long term (current) use of aspirin: Secondary | ICD-10-CM | POA: Insufficient documentation

## 2017-06-24 DIAGNOSIS — E871 Hypo-osmolality and hyponatremia: Secondary | ICD-10-CM | POA: Diagnosis not present

## 2017-06-24 DIAGNOSIS — R269 Unspecified abnormalities of gait and mobility: Secondary | ICD-10-CM | POA: Diagnosis not present

## 2017-06-24 DIAGNOSIS — N390 Urinary tract infection, site not specified: Secondary | ICD-10-CM | POA: Insufficient documentation

## 2017-06-24 DIAGNOSIS — D490 Neoplasm of unspecified behavior of digestive system: Secondary | ICD-10-CM | POA: Insufficient documentation

## 2017-06-24 DIAGNOSIS — K5900 Constipation, unspecified: Secondary | ICD-10-CM | POA: Diagnosis not present

## 2017-06-24 DIAGNOSIS — I5022 Chronic systolic (congestive) heart failure: Secondary | ICD-10-CM | POA: Insufficient documentation

## 2017-06-24 DIAGNOSIS — I4891 Unspecified atrial fibrillation: Secondary | ICD-10-CM | POA: Diagnosis not present

## 2017-06-24 DIAGNOSIS — Z87891 Personal history of nicotine dependence: Secondary | ICD-10-CM | POA: Insufficient documentation

## 2017-06-24 DIAGNOSIS — R531 Weakness: Secondary | ICD-10-CM | POA: Insufficient documentation

## 2017-06-24 DIAGNOSIS — E8809 Other disorders of plasma-protein metabolism, not elsewhere classified: Secondary | ICD-10-CM | POA: Diagnosis not present

## 2017-06-24 DIAGNOSIS — Z87440 Personal history of urinary (tract) infections: Secondary | ICD-10-CM | POA: Diagnosis not present

## 2017-06-24 DIAGNOSIS — Z6826 Body mass index (BMI) 26.0-26.9, adult: Secondary | ICD-10-CM | POA: Diagnosis not present

## 2017-06-24 DIAGNOSIS — Z79899 Other long term (current) drug therapy: Secondary | ICD-10-CM | POA: Insufficient documentation

## 2017-06-24 DIAGNOSIS — I110 Hypertensive heart disease with heart failure: Secondary | ICD-10-CM | POA: Diagnosis not present

## 2017-06-24 DIAGNOSIS — R109 Unspecified abdominal pain: Secondary | ICD-10-CM | POA: Diagnosis present

## 2017-06-24 DIAGNOSIS — I10 Essential (primary) hypertension: Secondary | ICD-10-CM | POA: Diagnosis present

## 2017-06-24 DIAGNOSIS — D500 Iron deficiency anemia secondary to blood loss (chronic): Secondary | ICD-10-CM | POA: Insufficient documentation

## 2017-06-24 DIAGNOSIS — R600 Localized edema: Secondary | ICD-10-CM | POA: Diagnosis present

## 2017-06-24 LAB — CBC WITH DIFFERENTIAL/PLATELET
BASOS ABS: 0 10*3/uL (ref 0.0–0.1)
BASOS PCT: 0 %
Eosinophils Absolute: 0 10*3/uL (ref 0.0–0.7)
Eosinophils Relative: 0 %
HEMATOCRIT: 24.5 % — AB (ref 36.0–46.0)
Hemoglobin: 8 g/dL — ABNORMAL LOW (ref 12.0–15.0)
LYMPHS PCT: 4 %
Lymphs Abs: 0.4 10*3/uL — ABNORMAL LOW (ref 0.7–4.0)
MCH: 25.6 pg — ABNORMAL LOW (ref 26.0–34.0)
MCHC: 32.7 g/dL (ref 30.0–36.0)
MCV: 78.5 fL (ref 78.0–100.0)
MONO ABS: 1.3 10*3/uL — AB (ref 0.1–1.0)
Monocytes Relative: 12 %
NEUTROS ABS: 9 10*3/uL — AB (ref 1.7–7.7)
Neutrophils Relative %: 84 %
PLATELETS: 589 10*3/uL — AB (ref 150–400)
RBC: 3.12 MIL/uL — AB (ref 3.87–5.11)
RDW: 15.4 % (ref 11.5–15.5)
WBC: 10.7 10*3/uL — AB (ref 4.0–10.5)

## 2017-06-24 LAB — COMPREHENSIVE METABOLIC PANEL
ALK PHOS: 166 U/L — AB (ref 38–126)
ALT: 16 U/L (ref 14–54)
ANION GAP: 10 (ref 5–15)
AST: 26 U/L (ref 15–41)
Albumin: 2.8 g/dL — ABNORMAL LOW (ref 3.5–5.0)
BILIRUBIN TOTAL: 0.6 mg/dL (ref 0.3–1.2)
BUN: 15 mg/dL (ref 6–20)
CO2: 22 mmol/L (ref 22–32)
Calcium: 8.8 mg/dL — ABNORMAL LOW (ref 8.9–10.3)
Chloride: 96 mmol/L — ABNORMAL LOW (ref 101–111)
Creatinine, Ser: 1.1 mg/dL — ABNORMAL HIGH (ref 0.44–1.00)
GFR, EST AFRICAN AMERICAN: 50 mL/min — AB (ref >60)
GFR, EST NON AFRICAN AMERICAN: 43 mL/min — AB (ref >60)
Glucose, Bld: 120 mg/dL — ABNORMAL HIGH (ref 65–99)
POTASSIUM: 4 mmol/L (ref 3.5–5.1)
Sodium: 128 mmol/L — ABNORMAL LOW (ref 135–145)
TOTAL PROTEIN: 6.7 g/dL (ref 6.5–8.1)

## 2017-06-24 LAB — TROPONIN I

## 2017-06-24 LAB — BRAIN NATRIURETIC PEPTIDE: B Natriuretic Peptide: 126.2 pg/mL — ABNORMAL HIGH (ref 0.0–100.0)

## 2017-06-24 MED ORDER — HEPARIN SODIUM (PORCINE) 5000 UNIT/ML IJ SOLN
5000.0000 [IU] | Freq: Three times a day (TID) | INTRAMUSCULAR | Status: DC
  Administered 2017-06-24 – 2017-06-26 (×5): 5000 [IU] via SUBCUTANEOUS
  Filled 2017-06-24 (×6): qty 1

## 2017-06-24 MED ORDER — ASPIRIN EC 81 MG PO TBEC
81.0000 mg | DELAYED_RELEASE_TABLET | Freq: Every day | ORAL | Status: DC
  Administered 2017-06-24 – 2017-06-26 (×3): 81 mg via ORAL
  Filled 2017-06-24 (×3): qty 1

## 2017-06-24 MED ORDER — FUROSEMIDE 10 MG/ML IJ SOLN
40.0000 mg | Freq: Once | INTRAMUSCULAR | Status: AC
  Administered 2017-06-24: 40 mg via INTRAVENOUS
  Filled 2017-06-24: qty 4

## 2017-06-24 MED ORDER — ADULT MULTIVITAMIN W/MINERALS CH
1.0000 | ORAL_TABLET | Freq: Every day | ORAL | Status: DC
  Administered 2017-06-24 – 2017-06-26 (×3): 1 via ORAL
  Filled 2017-06-24 (×3): qty 1

## 2017-06-24 NOTE — H&P (Signed)
Referring Physician:  Shaka Zech is an 83 y.o. female.                       Chief Complaint: Leg edema and abdominal pain  HPI: This 83 year old female presented with leg edema for a long time with occasional shortness of breath. She also has generalized abdominal discomfort and constipation. She denies fever, chills, chest pain and cough. She was recently diagnosed with urinary tract infection. CT abdomen done 10 days apart showed conflicting stomach finding.   Past Medical History:  Diagnosis Date  . Arthritis   . H/O diastolic dysfunction   . Heart palpitations   . Hypertension   . RBBB (right bundle branch block) 11/20/2010   Echo - EF >55%; flow pattern suggestive of impaired LV relaxation, proximal septal thickening noted; mitral valve mildly thickened; mild mitral annular calcification; aortic root sclerosis/calcification     No past surgical history on file.  No family history on file.   Social History:  reports that she quit smoking about 34 years ago. She has never used smokeless tobacco. She reports that she does not drink alcohol or use drugs.  Allergies: No Known Allergies  Medications Prior to Admission  Medication Sig Dispense Refill  . amLODipine (NORVASC) 5 MG tablet Take 5 mg by mouth daily.    Marland Kitchen aspirin 81 MG chewable tablet Chew 81 mg by mouth daily.    . cycloSPORINE (RESTASIS) 0.05 % ophthalmic emulsion Place 1 drop into both eyes 2 (two) times daily.    . ferrous sulfate 325 (65 FE) MG tablet Take 325 mg by mouth daily with breakfast.    . nitroGLYCERIN (NITROSTAT) 0.4 MG SL tablet Place 0.4 mg under the tongue every 5 (five) minutes as needed for chest pain.    . [DISCONTINUED] cephALEXin (KEFLEX) 500 MG capsule Take 1 capsule (500 mg total) by mouth 3 (three) times daily. (Patient not taking: Reported on 06/19/2017) 21 capsule 0  . [DISCONTINUED] phenazopyridine (PYRIDIUM) 200 MG tablet Take 1 tablet (200 mg total) by mouth 3 (three) times daily.  (Patient not taking: Reported on 06/19/2017) 6 tablet 0    No results found for this or any previous visit (from the past 48 hour(s)). No results found.  Review Of Systems Constitutional: No fever, chills, weight loss or gain. Eyes: No vision change, wears glasses. No discharge or pain. Ears: Positive hearing loss, No tinnitus. Respiratory: No asthma, COPD, pneumonias. Positive shortness of breath. No hemoptysis. Cardiovascular: No chest pain, palpitation, positive leg edema. Gastrointestinal: No nausea, vomiting, diarrhea, constipation. No GI bleed. No hepatitis. Genitourinary: No dysuria, hematuria, kidney stone. No incontinance. Neurological: No headache, stroke, seizures.  Psychiatry: No psych facility admission for anxiety, depression, suicide. No detox. Skin: No rash. Musculoskeletal: Positive joint pain, no fibromyalgia. No neck pain, back pain. Lymphadenopathy: No lymphadenopathy. Hematology: No anemia or easy bruising.   Blood pressure (!) 144/67, pulse 86, temperature 98.6 F (37 C), temperature source Oral, resp. rate 16, SpO2 100 %. There is no height or weight on file to calculate BMI. General appearance: alert, cooperative, appears stated age and mild distress Head: Normocephalic, atraumatic. Eyes: Brown eyes, pink conjunctiva, corneas clear. PERRL, EOM's intact. Neck: No adenopathy, no carotid bruit, positive JVD, supple, symmetrical, trachea midline and thyroid not enlarged. Resp: Clearing to auscultation bilaterally. Cardio: Regular rate and rhythm, S1, S2 normal, II/VI systolic murmur, no click, rub or gallop GI: Soft, non-tender; bowel sounds normal; no organomegaly. Extremities: 2 + pitting  edema of both lower legs, no cyanosis or clubbing. Bilateral knee swelling and tenderness. Skin: Warm and dry.  Neurologic: Alert and oriented X 3, normal strength for age. Very slow gait with walker.  Assessment/Plan Bilateral leg edema R/O CHF Abdominal pain R/O stomach  mass S/P UTI Hypertension RBBB  Place in observation. IV lasix x 1. R/O MI and CHF. Home medications. Hold amlodipine for leg edema. Partial code status.  Birdie Riddle, MD  06/24/2017, 6:38 PM

## 2017-06-24 NOTE — Progress Notes (Signed)
Received pt from direct admit from MD's office. Alert and Oriented, Son at bedside, Md came to see patient. Will endorsed accordingly.

## 2017-06-25 ENCOUNTER — Encounter (HOSPITAL_COMMUNITY): Payer: Self-pay | Admitting: *Deleted

## 2017-06-25 DIAGNOSIS — R600 Localized edema: Secondary | ICD-10-CM | POA: Diagnosis not present

## 2017-06-25 LAB — IRON AND TIBC
Iron: 15 ug/dL — ABNORMAL LOW (ref 28–170)
Saturation Ratios: 8 % — ABNORMAL LOW (ref 10.4–31.8)
TIBC: 188 ug/dL — ABNORMAL LOW (ref 250–450)
UIBC: 173 ug/dL

## 2017-06-25 LAB — CBC
HCT: 25.6 % — ABNORMAL LOW (ref 36.0–46.0)
Hemoglobin: 8.2 g/dL — ABNORMAL LOW (ref 12.0–15.0)
MCH: 25 pg — ABNORMAL LOW (ref 26.0–34.0)
MCHC: 32 g/dL (ref 30.0–36.0)
MCV: 78 fL (ref 78.0–100.0)
Platelets: 573 10*3/uL — ABNORMAL HIGH (ref 150–400)
RBC: 3.28 MIL/uL — ABNORMAL LOW (ref 3.87–5.11)
RDW: 15 % (ref 11.5–15.5)
WBC: 10.2 10*3/uL (ref 4.0–10.5)

## 2017-06-25 LAB — CULTURE, BLOOD (ROUTINE X 2)
Culture: NO GROWTH
SPECIAL REQUESTS: ADEQUATE
Special Requests: ADEQUATE

## 2017-06-25 LAB — BASIC METABOLIC PANEL
Anion gap: 7 (ref 5–15)
BUN: 16 mg/dL (ref 6–20)
CO2: 27 mmol/L (ref 22–32)
Calcium: 8.8 mg/dL — ABNORMAL LOW (ref 8.9–10.3)
Chloride: 97 mmol/L — ABNORMAL LOW (ref 101–111)
Creatinine, Ser: 1.11 mg/dL — ABNORMAL HIGH (ref 0.44–1.00)
GFR calc Af Amer: 50 mL/min — ABNORMAL LOW (ref >60)
GFR calc non Af Amer: 43 mL/min — ABNORMAL LOW (ref >60)
Glucose, Bld: 113 mg/dL — ABNORMAL HIGH (ref 65–99)
Potassium: 3.5 mmol/L (ref 3.5–5.1)
Sodium: 131 mmol/L — ABNORMAL LOW (ref 135–145)

## 2017-06-25 LAB — TROPONIN I
Troponin I: <0.03 ng/mL (ref <0.03)
Troponin I: <0.03 ng/mL (ref <0.03)

## 2017-06-25 LAB — FERRITIN: Ferritin: 362 ng/mL — ABNORMAL HIGH (ref 11–307)

## 2017-06-25 MED ORDER — POTASSIUM CHLORIDE CRYS ER 10 MEQ PO TBCR
10.0000 meq | EXTENDED_RELEASE_TABLET | Freq: Every day | ORAL | Status: DC
  Filled 2017-06-25: qty 1

## 2017-06-25 MED ORDER — POTASSIUM CHLORIDE CRYS ER 10 MEQ PO TBCR
10.0000 meq | EXTENDED_RELEASE_TABLET | Freq: Three times a day (TID) | ORAL | Status: DC
  Administered 2017-06-25 – 2017-06-26 (×4): 10 meq via ORAL
  Filled 2017-06-25 (×4): qty 1

## 2017-06-25 MED ORDER — FUROSEMIDE 40 MG PO TABS
40.0000 mg | ORAL_TABLET | Freq: Every day | ORAL | Status: DC
  Administered 2017-06-25 – 2017-06-26 (×2): 40 mg via ORAL
  Filled 2017-06-25 (×2): qty 1

## 2017-06-25 NOTE — Progress Notes (Signed)
Ref: Dixie Dials, MD   Subjective:  Breathing better post diuresis. Hyponatremia improving. Borderline abnormal BNP. Reviewed old CT scans of abdomen with Radiologist. She has slow growing stomach tumor for over 3 years or more.  Discussed finding with Son, [name].   Objective:  Vital Signs in the last 24 hours: Temp:  [98.4 F (36.9 C)-99.3 F (37.4 C)] 98.4 F (36.9 C) (08/21 0852) Pulse Rate:  [86-89] 87 (08/21 0852) Cardiac Rhythm: Bundle branch block (08/21 0220) Resp:  [16-18] 17 (08/21 0852) BP: (132-151)/(63-69) 148/63 (08/21 0852) SpO2:  [96 %-100 %] 100 % (08/21 0852) Weight:  [60.9 kg (134 lb 3.2 oz)-63 kg (138 lb 12.8 oz)] 60.9 kg (134 lb 3.2 oz) (08/21 0349)  Physical Exam: BP Readings from Last 1 Encounters:  06/25/17 (!) 148/63    Wt Readings from Last 1 Encounters:  06/25/17 60.9 kg (134 lb 3.2 oz)    Weight change:  Body mass index is 26.21 kg/m. HEENT: [place]/AT, Eyes-Brown, PERL, EOMI, Conjunctiva-Pale, Sclera-Non-icteric Neck: No JVD, No bruit, Trachea midline. Lungs:  Clear, Bilateral. Cardiac:  Regular rhythm, normal S1 and S2, no S3. II/VI systolic murmur. Abdomen:  Soft, non-tender. BS present. Extremities:  Trace to 1 + edema present. No cyanosis. No clubbing. CNS: AxOx3, Cranial nerves grossly intact, moves all 4 extremities.  Skin: Warm and dry.   Intake/Output from previous day: 08/20 0701 - 08/21 0700 In: 240 [P.O.:240] Out: 1550 [Urine:1550]    Lab Results: BMET    Component Value Date/Time   NA 131 (L) 06/25/2017 0606   NA 128 (L) 06/24/2017 1859   NA 134 (L) 06/19/2017 2222   K 3.5 06/25/2017 0606   K 4.0 06/24/2017 1859   K 4.1 06/19/2017 2222   CL 97 (L) 06/25/2017 0606   CL 96 (L) 06/24/2017 1859   CL 98 (L) 06/19/2017 2222   CO2 27 06/25/2017 0606   CO2 22 06/24/2017 1859   CO2 24 06/19/2017 2222   GLUCOSE 113 (H) 06/25/2017 0606   GLUCOSE 120 (H) 06/24/2017 1859   GLUCOSE 115 (H) 06/19/2017 2222   BUN 16 06/25/2017  0606   BUN 15 06/24/2017 1859   BUN 27 (H) 06/19/2017 2222   CREATININE 1.11 (H) 06/25/2017 0606   CREATININE 1.10 (H) 06/24/2017 1859   CREATININE 1.55 (H) 06/19/2017 2222   CALCIUM 8.8 (L) 06/25/2017 0606   CALCIUM 8.8 (L) 06/24/2017 1859   CALCIUM 9.4 06/19/2017 2222   GFRNONAA 43 (L) 06/25/2017 0606   GFRNONAA 43 (L) 06/24/2017 1859   GFRNONAA 29 (L) 06/19/2017 2222   GFRAA 50 (L) 06/25/2017 0606   GFRAA 50 (L) 06/24/2017 1859   GFRAA 33 (L) 06/19/2017 2222   CBC    Component Value Date/Time   WBC 10.2 06/25/2017 0606   RBC 3.28 (L) 06/25/2017 0606   HGB 8.2 (L) 06/25/2017 0606   HCT 25.6 (L) 06/25/2017 0606   PLT 573 (H) 06/25/2017 0606   MCV 78.0 06/25/2017 0606   MCH 25.0 (L) 06/25/2017 0606   MCHC 32.0 06/25/2017 0606   RDW 15.0 06/25/2017 0606   LYMPHSABS 0.4 (L) 06/24/2017 1859   MONOABS 1.3 (H) 06/24/2017 1859   EOSABS 0.0 06/24/2017 1859   BASOSABS 0.0 06/24/2017 1859   HEPATIC Function Panel  Recent Labs  06/09/17 0009 06/19/17 2222 06/24/17 1859  PROT 7.4 7.5 6.7   HEMOGLOBIN A1C No components found for: HGA1C,  MPG CARDIAC ENZYMES Lab Results  Component Value Date   TROPONINI <0.03 06/25/2017  TROPONINI <0.03 06/25/2017   TROPONINI <0.03 06/24/2017   BNP No results for input(s): PROBNP in the last 8760 hours. TSH No results for input(s): TSH in the last 8760 hours. CHOLESTEROL No results for input(s): CHOL in the last 8760 hours.  Scheduled Meds: . aspirin EC  81 mg Oral Daily  . furosemide  40 mg Oral Daily  . heparin  5,000 Units Subcutaneous Q8H  . multivitamin with minerals  1 tablet Oral Daily  . potassium chloride  10 mEq Oral Daily   Continuous Infusions: PRN Meds:.  Assessment/Plan: Chronic left heart systolic failure Bilateral leg edema resolving Hyponatremia, improving Stomach mass, slow growing tumor S/P UTI Hypertension RBBB Anemia of chronic blood loss/iron deficiency Hypoalbuminemia Weakness Gait  abnormality  Social service consult for SNF per family/Son request. Patient is not a candidate for aggressive treatment. PT consult.   LOS: 0 days    Dixie Dials  MD  06/25/2017, 9:02 AM

## 2017-06-25 NOTE — Evaluation (Signed)
Physical Therapy Evaluation Patient Details Name: Shannon Cruz MRN: [id number] DOB: August 30, 1925 Today's Date: 06/25/2017   History of Present Illness  This 83 year old female presented with leg edema for a long time with occasional shortness of breath. She also has generalized abdominal discomfort and constipation. She denies fever, chills, chest pain and cough. She was recently diagnosed with urinary tract infection. CT abdomen done 10 days apart showed conflicting stomach finding.  Clinical Impression  Patient presents with decreased mobility due to generalized weakness and increased fall risk with severe joint deformities bilateral knees.  Feel she would benefit from short term SNF level rehab at d/c, however, pt currently refusing so would need maximized [facility] services at d/c if she will even agree to that.  PT to follow acutely.    Follow Up Recommendations SNF;Supervision/Assistance - 24 hour (but pt refuses so maximize home services if pt agrees)    Equipment Recommendations  Other (comment) (shower seat)    Recommendations for Other Services       Precautions / Restrictions Precautions Precautions: Fall      Mobility  Bed Mobility Overal bed mobility: Modified Independent             General bed mobility comments: increased time, use of rail  Transfers Overall transfer level: Needs assistance Equipment used: Rolling walker (2 wheeled) Transfers: Sit to/from Stand Sit to Stand: Min guard;Supervision         General transfer comment: for safety due to increased time, heavy UE reliance  Ambulation/Gait Ambulation/Gait assistance: Supervision Ambulation Distance (Feet): 12 Feet Assistive device: Rolling walker (2 wheeled) Gait Pattern/deviations: Step-to pattern;Step-through pattern;Trunk flexed;Decreased dorsiflexion - right;Decreased dorsiflexion - left     General Gait Details: heavy UE reliance, toe strike with initial contact, refused further ambulation  despite encouragement due to feeling tired.  Stairs            Wheelchair Mobility    Modified Rankin (Stroke Patients Only)       Balance     Sitting balance-Leahy Scale: Good       Standing balance-Leahy Scale: Poor Standing balance comment: leans on elbows to wash hands, reliant on UE support due to LE instability                             Pertinent Vitals/Pain Pain Assessment: Faces Faces Pain Scale: Hurts a little bit Pain Location: knees Pain Descriptors / Indicators: Sore Pain Intervention(s): Monitored during session;Repositioned    Home Living Family/patient expects to be discharged to:: Private residence Living Arrangements: Alone Available Help at Discharge: Family;Available PRN/intermittently Type of Home: House Home Access: Stairs to enter Entrance Stairs-Rails: Right Entrance Stairs-Number of Steps: 5 Home Layout: One level Home Equipment: Toilet riser;Walker - 2 wheels Additional Comments: states she needs a shower seat    Prior Function Level of Independence: Independent with assistive device(s)         Comments: cooks and cleans     Hand Dominance        Extremity/Trunk Assessment   Upper Extremity Assessment Upper Extremity Assessment: Overall WFL for tasks assessed    Lower Extremity Assessment Lower Extremity Assessment: RLE deficits/detail;LLE deficits/detail RLE Deficits / Details: 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; able to move legs unaided, but strength not formally tested LLE Deficits / Details: 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  Did not want me to call family to discuss and was irritated when I spoke with her grandson in room who asked how she did      General Comments General comments (skin integrity, edema, etc.): grandson in room     Exercises     Assessment/Plan    PT Assessment Patient needs continued PT services  PT Problem List Decreased balance;Decreased knowledge of use of DME;Decreased mobility;Decreased safety awareness;Decreased strength       PT Treatment Interventions DME instruction;Therapeutic activities;Therapeutic exercise;Patient/family education;Gait training;Stair training;Balance training;Functional mobility training    PT Goals (Current goals can be found in the Care Plan section)  Acute Rehab PT Goals Patient Stated Goal: To go home alone PT Goal Formulation: With patient Time For Goal Achievement: 07/02/17 Potential to Achieve Goals: Fair    Frequency Min 3X/week   Barriers to discharge Decreased caregiver support      Co-evaluation               AM-PAC PT "6 Clicks" Daily Activity  Outcome Measure Difficulty turning over in bed (including adjusting bedclothes, sheets and blankets)?: None Difficulty moving from lying on back to sitting on the side of the bed? : None Difficulty sitting down on and standing up from a chair with arms (e.g., wheelchair, bedside commode, etc,.)?: Unable Help needed moving to and from a bed to chair (including a wheelchair)?: A Little Help needed walking in hospital room?: A Little Help needed climbing 3-5 steps with a railing? : A Little 6 Click Score: 18    End of Session Equipment Utilized During Treatment: Gait belt Activity Tolerance: Patient limited by fatigue Patient left: in bed;with call bell/phone within reach;with family/visitor  present Nurse Communication: Mobility status PT Visit Diagnosis: Other abnormalities of gait and mobility (R26.89)    Time: 1546-1610 PT Time Calculation (min) (ACUTE ONLY): 24 min   Charges:   PT Evaluation $PT Eval Moderate Complexity: 1 Mod PT Treatments $Gait Training: 8-22 mins   PT G Codes:   PT G-Codes **NOT FOR INPATIENT CLASS** Functional Assessment Tool Used: AM-PAC 6 Clicks Basic Mobility Functional Limitation: Mobility: Walking and moving around Mobility: Walking and Moving Around Current Status (R8381): At least 40 percent but less than 60 percent impaired, limited or restricted Mobility: Walking and Moving Around Goal Status [phone]): At least 20 percent but less than 40 percent impaired, limited or restricted    War, Friedens 06/25/2017   Reginia Naas 06/25/2017, 4:38 PM

## 2017-06-25 NOTE — Clinical Social Work Note (Signed)
Clinical Social Work Assessment  Patient Details  Name: Shannon Cruz MRN: [id number] Date of Birth: 24-Sep-1925  Date of referral:  06/25/17               Reason for consult:  Discharge Planning                Permission sought to share information with:    Permission granted to share information::  No  Name::        Agency::     Relationship::     Contact Information:     Housing/Transportation Living arrangements for the past 2 months:  Single Family Home Source of Information:  Patient, Medical Team Patient Interpreter Needed:  None Criminal Activity/Legal Involvement Pertinent to Current Situation/Hospitalization:  No - Comment as needed Significant Relationships:  Adult Children Lives with:  Self Do you feel safe going back to the place where you live?  Yes Need for family participation in patient care:  Yes (Comment)  Care giving concerns:  Patient's son interested in SNF, per MD note from today.   Social Worker assessment / plan:  CSW met with patient. No supports at bedside. CSW introduced role and explained that discharge planning would be discussed. CSW explained that we are waiting on PT to evaluate her. CSW asked if they recommended SNF, if she would be interested in that. Patient wants to discuss with her son. CSW offered to call him but patient declined, stating that he would be at work until 11:00 pm. CSW asked if I could go ahead and send referral just in case she decides to go to SNF. Patient wants to wait to see what PT recommends. CSW provided SNF list for review. Patient stated MD told her she would discharge tomorrow. No further concerns. CSW encouraged patient to contact CSW as needed. CSW will continue to follow patient for support and facilitate discharge to SNF, if recommended and agreeable, once medically stable.  Employment status:  Retired Nurse, adult PT Recommendations:  Not assessed at this time Information / Referral to  community resources:  [place]  Patient/Family's Response to care:  Patient wants to wait and see what PT recommends before discussing with her son and sending out referral. Patient's son supportive and involved in patient's care. Patient appreciated social work intervention.  Patient/Family's Understanding of and Emotional Response to Diagnosis, Current Treatment, and Prognosis:  Patient has a good understanding of the reason for admission. She wants to wait on PT recommendations before moving forward with discharge planning. Patient appears happy with hospital care.  Emotional Assessment Appearance:  Appears stated age Attitude/Demeanor/Rapport:  Other (Pleasant) Affect (typically observed):  Accepting, Appropriate, Calm, Pleasant Orientation:  Oriented to Self, Oriented to Place, Oriented to  Time, Oriented to Situation Alcohol / Substance use:  Never Used Psych involvement (Current and /or in the community):  No (Comment)  Discharge Needs  Concerns to be addressed:  Care Coordination Readmission within the last 30 days:  No Current discharge risk:  Other (Waiting on PT recommendations) Barriers to Discharge:  Continued Medical Work up   Candie Chroman, LCSW 06/25/2017, 2:05 PM

## 2017-06-25 NOTE — Clinical Social Work Note (Addendum)
PT order placed this morning. CSW left voicemail for acute rehab department to see if they can have someone evaluate for SNF ASAP. Unit secretary will also notify physical therapists on the unit once they are available.  Dayton Scrape, CSW [phone]  1:41 pm CSW spoke with Abigail Butts in the acute rehab dept. She will send out a page for someone to evaluate the patient for SNF.  Dayton Scrape, [place]

## 2017-06-26 ENCOUNTER — Telehealth: Payer: Self-pay | Admitting: Emergency Medicine

## 2017-06-26 ENCOUNTER — Other Ambulatory Visit: Payer: Self-pay

## 2017-06-26 DIAGNOSIS — I110 Hypertensive heart disease with heart failure: Secondary | ICD-10-CM | POA: Diagnosis not present

## 2017-06-26 DIAGNOSIS — I5022 Chronic systolic (congestive) heart failure: Secondary | ICD-10-CM | POA: Diagnosis not present

## 2017-06-26 DIAGNOSIS — I4510 Unspecified right bundle-branch block: Secondary | ICD-10-CM | POA: Diagnosis not present

## 2017-06-26 DIAGNOSIS — R600 Localized edema: Secondary | ICD-10-CM | POA: Diagnosis not present

## 2017-06-26 LAB — BASIC METABOLIC PANEL
ANION GAP: 7 (ref 5–15)
BUN: 16 mg/dL (ref 6–20)
CALCIUM: 8.7 mg/dL — AB (ref 8.9–10.3)
CO2: 29 mmol/L (ref 22–32)
Chloride: 97 mmol/L — ABNORMAL LOW (ref 101–111)
Creatinine, Ser: 1.11 mg/dL — ABNORMAL HIGH (ref 0.44–1.00)
GFR calc Af Amer: 50 mL/min — ABNORMAL LOW (ref >60)
GFR calc non Af Amer: 43 mL/min — ABNORMAL LOW (ref >60)
GLUCOSE: 112 mg/dL — AB (ref 65–99)
POTASSIUM: 4 mmol/L (ref 3.5–5.1)
Sodium: 133 mmol/L — ABNORMAL LOW (ref 135–145)

## 2017-06-26 MED ORDER — ASCORBIC ACID 250 MG PO TABS: 250.0000 mg | ORAL_TABLET | Freq: Every day | ORAL | 3 refills | Status: DC

## 2017-06-26 MED ORDER — DILTIAZEM HCL 30 MG PO TABS
30.0000 mg | ORAL_TABLET | Freq: Four times a day (QID) | ORAL | Status: DC
  Administered 2017-06-26: 30 mg via ORAL
  Filled 2017-06-26: qty 1

## 2017-06-26 MED ORDER — VITAMIN C 500 MG PO TABS: 250.0000 mg | ORAL_TABLET | Freq: Every day | ORAL | Status: DC

## 2017-06-26 MED ORDER — FUROSEMIDE 20 MG PO TABS: 20.0000 mg | ORAL_TABLET | Freq: Every day | ORAL | 3 refills | Status: DC

## 2017-06-26 MED ORDER — METOPROLOL TARTRATE 25 MG PO TABS: 25.0000 mg | ORAL_TABLET | Freq: Two times a day (BID) | ORAL | 3 refills | Status: DC

## 2017-06-26 MED ORDER — METOPROLOL TARTRATE 25 MG PO TABS
25.0000 mg | ORAL_TABLET | Freq: Two times a day (BID) | ORAL | Status: DC
  Administered 2017-06-26 (×2): 25 mg via ORAL
  Filled 2017-06-26: qty 1

## 2017-06-26 MED ORDER — DILTIAZEM HCL 60 MG PO TABS: 60.0000 mg | ORAL_TABLET | Freq: Two times a day (BID) | ORAL | 3 refills | Status: DC

## 2017-06-26 MED ORDER — POTASSIUM CHLORIDE CRYS ER 10 MEQ PO TBCR: 10.0000 meq | EXTENDED_RELEASE_TABLET | Freq: Every day | ORAL | 3 refills | Status: DC

## 2017-06-26 MED ORDER — FERROUS SULFATE 325 (65 FE) MG PO TABS: 325.0000 mg | ORAL_TABLET | Freq: Every day | ORAL | Status: DC

## 2017-06-26 MED ORDER — METOPROLOL TARTRATE 25 MG PO TABS
ORAL_TABLET | ORAL | Status: AC
  Filled 2017-06-26: qty 1

## 2017-06-26 NOTE — Telephone Encounter (Signed)
Post ED Visit - Positive Culture Follow-up  Culture report reviewed by antimicrobial stewardship pharmacist:  []  Elenor Quinones, Pharm.D. []  Heide Guile, Pharm.D., BCPS AQ-ID []  Parks Neptune, Pharm.D., BCPS []  Alycia Rossetti, Pharm.D., BCPS []  [place], [place].D., BCPS, AAHIVP []  Legrand Como, Pharm.D., BCPS, AAHIVP []  Salome Arnt, PharmD, BCPS []  Dimitri Ped, PharmD, BCPS []  Vincenza Hews, PharmD, BCPS Nida Boatman PharmD  Positive blood culture Patient is an inpatient at [facility] and no further patient follow-up is required at this time.  Hazle Nordmann 06/26/2017, 1:31 PM

## 2017-06-26 NOTE — Discharge Summary (Signed)
Physician Discharge Summary  Patient ID: Shannon Cruz MRN: [id number] DOB/AGE: [birth year] 83 y.o.  Admit date: 06/24/2017 Discharge date: 06/26/2017  Admission Diagnoses: Bilateral leg edema R/O CHF Abdominal pain R/O stomach mass S/P UTI Hypertension RBBB (right bundle branch block)  Discharge Diagnoses:  Principal Problem: * Bilateral leg edema * Active Problems:   HTN (hypertension)   RBBB (right bundle branch block)   Abdominal pain   Slow growing tumor of stomach   S/P UTI   Hypoalbuminemia   Weakness   Gait abnormality   Moderate protein calorie malnutrition   Iron deficiency anemia   Discharged Condition: good  Hospital Course: 83 year old female has long standing history of leg edema with occasional shortness of breath. She also had generalized abdominal pain and constipation. She had CT scan of her abdomen in 2015 and 2018 x 2, all showing slowly growing tumor of stomach. She responded very well to IV lasix followed by PO lasix.  Her stomach tumor finding was discussed with family and radiologist and since patient was not a candidate for surgery or chemotherapy, it was decided to treat 83 year old patient medically.  Nursing home placement was attempted but patient refused and home health care was arranged.  Her sinus tachycardia responded well to small dose of metoprolol and diltiazem.  Will add ascorbic acid to increase iron absorption. She will see me in 1 week for follow up.  Consults: cardiology    Significant Diagnostic Studies: labs: Low Hgb, iron level and saturation. Low sodium and chloride levels and mildly elevated creatinine of 1.11. Borderline BNP of 126.2 pg.  Treatments: cardiac meds: metoprolol, diltiazem, furosemide and potassium.  Discharge Exam: Blood pressure 102/69, pulse (!) 118, temperature 98.1 F (36.7 C), temperature source Oral, resp. rate 20, height 5' (1.524 m), weight 60.2 kg (132 lb 12.8 oz), SpO2 99 %. General appearance:  alert, cooperative and appears stated age. Head: Normocephalic, atraumatic. Eyes: Brown eyes, pink conjunctiva, corneas clear. PERRL, EOM's intact.  Neck: No adenopathy, no carotid bruit, no JVD, supple, symmetrical, trachea midline and thyroid not enlarged. Resp: Clear to auscultation bilaterally. Cardio: Regular rate and rhythm, S1, S2 normal, II/VI systolic murmur, no click, rub or gallop. GI: Soft, non-tender; bowel sounds normal; no organomegaly. Extremities: Trace edema, cyanosis or clubbing. Skin: Warm and dry.  Neurologic: Alert and oriented X 3, normal strength and tone. Very slow gait with walker,  Disposition: 01-Home or Self Care + home health care.   Allergies as of 06/26/2017   No Known Allergies     Medication List    STOP taking these medications   amLODipine 5 MG tablet Commonly known as:  NORVASC     TAKE these medications   ascorbic acid 250 MG tablet Commonly known as:  VITAMIN C Take 1 tablet (250 mg total) by mouth daily with breakfast.   aspirin 81 MG chewable tablet Chew 81 mg by mouth daily.   diltiazem 60 MG tablet Commonly known as:  CARDIZEM Take 1 tablet (60 mg total) by mouth 2 (two) times daily.   ferrous sulfate 325 (65 FE) MG tablet Take 325 mg by mouth daily with breakfast.   furosemide 20 MG tablet Commonly known as:  LASIX Take 1 tablet (20 mg total) by mouth daily.   metoprolol tartrate 25 MG tablet Commonly known as:  LOPRESSOR Take 1 tablet (25 mg total) by mouth 2 (two) times daily.   nitroGLYCERIN 0.4 MG SL tablet Commonly known as:  NITROSTAT Place 0.4  mg under the tongue every 5 (five) minutes as needed for chest pain.   potassium chloride 10 MEQ tablet Commonly known as:  K-DUR,KLOR-CON Take 1 tablet (10 mEq total) by mouth daily.            Discharge Care Instructions        Start     Ordered   06/27/17 0000  furosemide (LASIX) 20 MG tablet  Daily     06/26/17 1434   06/27/17 0000  vitamin C (VITAMIN C)  250 MG tablet  Daily with breakfast     06/26/17 1453   06/26/17 0000  diltiazem (CARDIZEM) 60 MG tablet  2 times daily     06/26/17 1434   06/26/17 0000  metoprolol tartrate (LOPRESSOR) 25 MG tablet  2 times daily     06/26/17 1434   06/26/17 0000  potassium chloride (K-DUR,KLOR-CON) 10 MEQ tablet  Daily     06/26/17 1434     Follow-up Information    [facility], [facility].   Specialty:  [place] Why:  They will do your home health care at your home Contact information: [place] [address] [phone]        Dixie Dials, MD. Schedule an appointment as soon as possible for a visit on 07/03/2017.   Specialty:  Cardiology Why:  @2 :30pm Contact information: [place] [address] [phone]           Signed: Birdie Riddle 06/26/2017, 2:34 PM

## 2017-06-26 NOTE — Progress Notes (Signed)
Called MD to verify if pt was being discharged home today per pt request. Family and Patient updated on plan for discharge possibly later today.

## 2017-06-26 NOTE — Progress Notes (Signed)
Lonnie called back and stated his nephew will be here to pick her up at 5pm. Educated pt about transportation home.  IV and tele removed

## 2017-06-26 NOTE — Progress Notes (Signed)
MD made aware HR sustaining 70s. Will come to discharge pt home.

## 2017-06-26 NOTE — Progress Notes (Signed)
Contacted lonnie pts soon regarding discharge home. Lonnie to arrange ride home and will contact this nurse back.

## 2017-06-26 NOTE — Progress Notes (Signed)
Pt upset about being discharged to SNF, Case management notified.

## 2017-06-26 NOTE — Clinical Social Work Note (Signed)
Per PT evaluation, patient is refusing SNF. CSW spoke with MD this morning who confirmed patient's refusal. RNCM aware and will arrange home health if patient is agreeable. Patient will discharge home today.  CSW signing off. Consult again if any other social work needs arise.  Dayton Scrape, [place]

## 2017-06-26 NOTE — Care Management Note (Signed)
Case Management Note  Patient Details  Name: Shannon Cruz MRN: [id number] Date of Birth: [birth year]  Subjective/Objective:  Bilateral Leg Edema                 Action/Plan: CM talked to patient with her son Shannon Cruz at the bedside for DCP; patient is refusing to go to SNF short term and desires to go home. Her son is in agreement with the DCP; [place] choice given, son chose [facility] [place]); Mary with [facility] called for arrangements; DME- walker and cane at home.  Expected Discharge Date:      06/27/2017            Expected Discharge Plan:  [place]  Discharge planning Services  CM Consult  Choice offered to:  Adult Children  HH Arranged:  RN, Disease Management, PT, Nurse's Aide, Social Work [employer] Agency:  [employer] (now [facility])  Status of Service:  In process, will continue to follow  Sherrilyn Rist [phone] 06/26/2017, 10:26 AM

## 2017-06-26 NOTE — Progress Notes (Signed)
Pt had an episode of Tachycardia, HR up to 160's non sustaining. Pt denies chest pain, denies nausea and vomiting, not in respiratory distress. EKG done showing Afib with RVR. Dr. Doylene Canard was notified and ordered verbally to give  Metoprolol tartrate 25mg  PO now then every 12hrs. Will continue to monitor pt.   06/26/17 0057  Vitals  Temp 98.6 F (37 C)  Temp Source Oral  BP (!) 142/77  BP Location Right Arm  BP Method Automatic  Patient Position (if appropriate) Lying  Pulse Rate (!) 124  Pulse Rate Source Dinamap  Cardiac Rhythm Atrial fibrillation  Resp 20  Oxygen Therapy  SpO2 100 %  O2 Device Nasal Cannula  O2 Flow Rate (L/min) 2 L/min  Pain Assessment  Pain Assessment No/denies pain

## 2017-09-26 ENCOUNTER — Encounter (HOSPITAL_COMMUNITY): Payer: Self-pay | Admitting: Emergency Medicine

## 2017-09-26 ENCOUNTER — Emergency Department (HOSPITAL_COMMUNITY)
Admission: EM | Admit: 2017-09-26 | Discharge: 2017-09-26 | Disposition: A | Discharge status: Home / Self Care | Payer: Medicare Other | Attending: Emergency Medicine | Admitting: Emergency Medicine

## 2017-09-26 DIAGNOSIS — I10 Essential (primary) hypertension: Secondary | ICD-10-CM | POA: Diagnosis not present

## 2017-09-26 DIAGNOSIS — Z7901 Long term (current) use of anticoagulants: Secondary | ICD-10-CM | POA: Diagnosis not present

## 2017-09-26 DIAGNOSIS — N390 Urinary tract infection, site not specified: Secondary | ICD-10-CM

## 2017-09-26 DIAGNOSIS — Z79899 Other long term (current) drug therapy: Secondary | ICD-10-CM | POA: Diagnosis not present

## 2017-09-26 DIAGNOSIS — Z7982 Long term (current) use of aspirin: Secondary | ICD-10-CM | POA: Diagnosis not present

## 2017-09-26 DIAGNOSIS — I4510 Unspecified right bundle-branch block: Secondary | ICD-10-CM | POA: Diagnosis not present

## 2017-09-26 DIAGNOSIS — R1084 Generalized abdominal pain: Secondary | ICD-10-CM | POA: Diagnosis present

## 2017-09-26 LAB — CBC
HCT: 35.2 % — ABNORMAL LOW (ref 36.0–46.0)
Hemoglobin: 11 g/dL — ABNORMAL LOW (ref 12.0–15.0)
MCH: 24.5 pg — ABNORMAL LOW (ref 26.0–34.0)
MCHC: 31.3 g/dL (ref 30.0–36.0)
MCV: 78.4 fL (ref 78.0–100.0)
Platelets: 350 10*3/uL (ref 150–400)
RBC: 4.49 MIL/uL (ref 3.87–5.11)
RDW: 17.9 % — AB (ref 11.5–15.5)
WBC: 14.8 10*3/uL — AB (ref 4.0–10.5)

## 2017-09-26 LAB — COMPREHENSIVE METABOLIC PANEL
ALBUMIN: 3.6 g/dL (ref 3.5–5.0)
ALK PHOS: 107 U/L (ref 38–126)
ALT: 9 U/L — ABNORMAL LOW (ref 14–54)
ANION GAP: 9 (ref 5–15)
AST: 22 U/L (ref 15–41)
BILIRUBIN TOTAL: 0.9 mg/dL (ref 0.3–1.2)
BUN: 28 mg/dL — AB (ref 6–20)
CALCIUM: 9.5 mg/dL (ref 8.9–10.3)
CO2: 25 mmol/L (ref 22–32)
Chloride: 101 mmol/L (ref 101–111)
Creatinine, Ser: 1.85 mg/dL — ABNORMAL HIGH (ref 0.44–1.00)
GFR calc Af Amer: 27 mL/min — ABNORMAL LOW (ref >60)
GFR, EST NON AFRICAN AMERICAN: 23 mL/min — AB (ref >60)
GLUCOSE: 102 mg/dL — AB (ref 65–99)
Potassium: 3.9 mmol/L (ref 3.5–5.1)
Sodium: 135 mmol/L (ref 135–145)
TOTAL PROTEIN: 7.3 g/dL (ref 6.5–8.1)

## 2017-09-26 LAB — URINALYSIS, ROUTINE W REFLEX MICROSCOPIC
Bilirubin Urine: NEGATIVE
GLUCOSE, UA: NEGATIVE mg/dL
Ketones, ur: NEGATIVE mg/dL
NITRITE: NEGATIVE
PH: 5 (ref 5.0–8.0)
PROTEIN: NEGATIVE mg/dL
SPECIFIC GRAVITY, URINE: 1.009 (ref 1.005–1.030)

## 2017-09-26 LAB — LIPASE, BLOOD: Lipase: 21 U/L (ref 11–51)

## 2017-09-26 MED ORDER — CEPHALEXIN 500 MG PO CAPS: 500.0000 mg | ORAL_CAPSULE | Freq: Two times a day (BID) | ORAL | 0 refills | Status: AC

## 2017-09-26 MED ORDER — SODIUM CHLORIDE 0.9 % IV BOLUS (SEPSIS)
1000.0000 mL | Freq: Once | INTRAVENOUS | Status: AC
  Administered 2017-09-26: 1000 mL via INTRAVENOUS

## 2017-09-26 MED ORDER — IOPAMIDOL (ISOVUE-300) INJECTION 61%
30.0000 mL | Freq: Once | INTRAVENOUS | Status: DC | PRN
  Administered 2017-09-26: 30 mL via ORAL
  Filled 2017-09-26: qty 30

## 2017-09-26 MED ORDER — IOPAMIDOL (ISOVUE-300) INJECTION 61%
INTRAVENOUS | Status: AC
  Filled 2017-09-26: qty 30

## 2017-09-26 MED ORDER — DEXTROSE 5 % IV SOLN: 1.0000 g | Freq: Once | INTRAVENOUS | Status: DC

## 2017-09-26 MED ORDER — CEPHALEXIN 500 MG PO CAPS
500.0000 mg | ORAL_CAPSULE | Freq: Once | ORAL | Status: AC
  Administered 2017-09-26: 500 mg via ORAL
  Filled 2017-09-26: qty 1

## 2017-09-26 NOTE — Discharge Instructions (Signed)
Take Keflex twice daily for 1 week. Follow-up with your primary care provider for further evaluation. Return to ED for worsening abdominal pain, blood in stool, pain with urination, vomiting.

## 2017-09-26 NOTE — ED Provider Notes (Signed)
[facility] DEPT Provider Note   CSN: [id number] Arrival date & time: 09/26/17  1635     History   Chief Complaint Chief Complaint  Patient presents with  . Abdominal Pain    HPI Shannon Cruz is a 83 y.o. female with a past medical history of hypertension, stomach tumor, presents to ED for evaluation of mid-abdominal pain for the past several hours.  She states that the pain began when she was getting up out of her bed.  She also reports associated constipation which she states she frequently has, as well as dysuria, which she does not usually have.  States that the pain does not radiate.  She is unable to characterize the pain or describe it.  States it is worse with movement.  She denies any nausea, vomiting, changes in appetite, fevers, hematuria. Of note, patient was seen and evaluated here approximately 3 months ago and was found to have a tumor of her stomach.  However, patient and family state that they only want conservative measures taken.  Her son states that she was unable to follow-up with her GI specialist because she did not want an endoscopy done.  HPI  Past Medical History:  Diagnosis Date  . Arthritis   . H/O diastolic dysfunction   . Heart palpitations   . Hypertension   . RBBB (right bundle branch block) 11/20/2010   Echo - EF >55%; flow pattern suggestive of impaired LV relaxation, proximal septal thickening noted; mitral valve mildly thickened; mild mitral annular calcification; aortic root sclerosis/calcification    Patient Active Problem List   Diagnosis Date Noted  . Bilateral leg edema 06/24/2017  . Abdominal pain 06/24/2017  . Atrial bigeminy 10/09/2013  . HTN (hypertension) 03/19/2013  . RBBB (right bundle branch block) 03/19/2013  . Left shoulder pain 03/19/2013    History reviewed. No pertinent surgical history.  OB History    No data available       Home Medications    Prior to Admission medications     Medication Sig Start Date End Date Taking? Authorizing Provider  acetaminophen (TYLENOL) 500 MG tablet Take 500 mg by mouth every 6 (six) hours as needed for mild pain.   Yes [provider]  aspirin 81 MG chewable tablet Chew 81 mg by mouth daily.   Yes [provider]  diltiazem (CARDIZEM) 60 MG tablet Take 1 tablet (60 mg total) by mouth 2 (two) times daily. 06/26/17  Yes Dixie Dials, MD  furosemide (LASIX) 20 MG tablet Take 1 tablet (20 mg total) by mouth daily. 06/27/17  Yes Dixie Dials, MD  metoprolol tartrate (LOPRESSOR) 25 MG tablet Take 1 tablet (25 mg total) by mouth 2 (two) times daily. 06/26/17  Yes Dixie Dials, MD  nitroGLYCERIN (NITROSTAT) 0.4 MG SL tablet Place 0.4 mg under the tongue every 5 (five) minutes as needed for chest pain.   Yes [provider]  potassium chloride (K-DUR,KLOR-CON) 10 MEQ tablet Take 1 tablet (10 mEq total) by mouth daily. 06/26/17  Yes Dixie Dials, MD  RESTASIS 0.05 % ophthalmic emulsion Place 1 drop into both eyes at bedtime. 09/20/17  Yes [provider]  cephALEXin (KEFLEX) 500 MG capsule Take 1 capsule (500 mg total) by mouth 2 (two) times daily for 7 days. 09/26/17 10/03/17  Delia Heady, PA-C  vitamin C (VITAMIN C) 250 MG tablet Take 1 tablet (250 mg total) by mouth daily with breakfast. Patient not taking: Reported on 09/26/2017 06/27/17   Dixie Dials,  MD    Family History History reviewed. No pertinent family history.  Social History Social History   Tobacco Use  . Smoking status: Former Smoker    Last attempt to quit: 11/05/1982    Years since quitting: 34.9  . Smokeless tobacco: Never Used  Substance Use Topics  . Alcohol use: No  . Drug use: No     Allergies   Patient has no known allergies.   Review of Systems Review of Systems  Constitutional: Negative for appetite change, chills and fever.  HENT: Negative for ear pain, rhinorrhea, sneezing and sore throat.   Eyes: Negative for  photophobia and visual disturbance.  Respiratory: Negative for cough, chest tightness, shortness of breath and wheezing.   Cardiovascular: Negative for chest pain and palpitations.  Gastrointestinal: Positive for abdominal pain and constipation. Negative for blood in stool, diarrhea, nausea and vomiting.  Genitourinary: Negative for dysuria, hematuria and urgency.  Musculoskeletal: Negative for myalgias.  Skin: Negative for rash.  Neurological: Negative for dizziness, weakness and light-headedness.     Physical Exam Updated Vital Signs BP 129/62 (BP Location: Left Arm)   Pulse 78   Temp 98.2 F (36.8 C) (Oral)   Resp 16   SpO2 99%   Physical Exam  Constitutional: She appears well-developed and well-nourished. No distress.  Nontoxic appearing and in no acute distress.  Pleasant.  HENT:  Head: Normocephalic and atraumatic.  Nose: Nose normal.  Eyes: Conjunctivae and EOM are normal. Left eye exhibits no discharge. No scleral icterus.  Neck: Normal range of motion. Neck supple.  Cardiovascular: Normal rate, regular rhythm, normal heart sounds and intact distal pulses. Exam reveals no gallop and no friction rub.  No murmur heard. Pulmonary/Chest: Effort normal and breath sounds normal. No respiratory distress.  Abdominal: Soft. Bowel sounds are normal. She exhibits no distension. There is tenderness. There is no rebound and no guarding.    Musculoskeletal: Normal range of motion. She exhibits no edema.  Neurological: She is alert. She exhibits normal muscle tone. Coordination normal.  Skin: Skin is warm and dry. No rash noted.  Psychiatric: She has a normal mood and affect.  Nursing note and vitals reviewed.    ED Treatments / Results  Labs (all labs ordered are listed, but only abnormal results are displayed) Labs Reviewed  COMPREHENSIVE METABOLIC PANEL - Abnormal; Notable for the following components:      Result Value   Glucose, Bld 102 (*)    BUN 28 (*)    Creatinine,  Ser 1.85 (*)    ALT 9 (*)    GFR calc non Af Amer 23 (*)    GFR calc Af Amer 27 (*)    All other components within normal limits  CBC - Abnormal; Notable for the following components:   WBC 14.8 (*)    Hemoglobin 11.0 (*)    HCT 35.2 (*)    MCH 24.5 (*)    RDW 17.9 (*)    All other components within normal limits  URINALYSIS, ROUTINE W REFLEX MICROSCOPIC - Abnormal; Notable for the following components:   Hgb urine dipstick SMALL (*)    Leukocytes, UA LARGE (*)    Bacteria, UA RARE (*)    Squamous Epithelial / LPF 0-5 (*)    All other components within normal limits  URINE CULTURE  LIPASE, BLOOD    EKG  EKG Interpretation None       Radiology No results found.  Procedures Procedures (including critical care time)  Medications Ordered  in ED Medications  cephALEXin (KEFLEX) capsule 500 mg (not administered)  sodium chloride 0.9 % bolus 1,000 mL (0 mLs Intravenous Stopped 09/26/17 2023)     Initial Impression / Assessment and Plan / ED Course  I have reviewed the triage vital signs and the nursing notes.  Pertinent labs & imaging results that were available during my care of the patient were reviewed by me and considered in my medical decision making (see chart for details).     Patient presents to ED for evaluation of mid hours.  She states that the pain began when she was getting up out of her bed and is worse with movement.  She also reports associated constipation and dysuria. On physical exam she does have some tenderness to palpation of the middle of her abdomen but no rebound or guarding present.  She is afebrile with no history of fever.  She does not appear dehydrated.  CBC shows leukocytosis at 14.8.  CMP shows mild AK I.  Urinalysis with evidence of UTI.  Patient given fluids here in the ED for her AK I.  Initially planned for CT of the abdomen.  However, patient stated that she would rather just be treated for her UTI with oral antibiotics and would not like  the CT of her abdomen done at this time.  Will give patient Keflex for UTI and sent for culture.  I did advise her to follow-up with her primary care provider for further evaluation. At this time I do not believe that her abdominal pain is due to a life threatening condition or emergency. She did tell Dr. Billy Fischer that her abdominal pain as completely resolved. Strict return precautions given.   Patient discussed with and seen by Dr. Billy Fischer.  Final Clinical Impressions(s) / ED Diagnoses   Final diagnoses:  Lower urinary tract infectious disease    ED Discharge Orders        Ordered    cephALEXin (KEFLEX) 500 MG capsule  2 times daily     09/26/17 2031       Delia Heady, PA-C 09/26/17 2045    Gareth Morgan, MD 09/27/17 0040

## 2017-09-26 NOTE — ED Triage Notes (Addendum)
Pt reports she began to have mid abd pain this am with palpation. No n/v/d. Cannot think of any triggers.

## 2017-09-28 LAB — URINE CULTURE: Culture: NO GROWTH

## 2017-11-29 ENCOUNTER — Emergency Department (HOSPITAL_COMMUNITY)
Admission: EM | Admit: 2017-11-29 | Discharge: 2017-11-30 | Disposition: A | Discharge status: Home / Self Care | Payer: Medicare Other | Attending: Emergency Medicine | Admitting: Emergency Medicine

## 2017-11-29 ENCOUNTER — Emergency Department (HOSPITAL_COMMUNITY): Payer: Medicare Other

## 2017-11-29 DIAGNOSIS — W010XXA Fall on same level from slipping, tripping and stumbling without subsequent striking against object, initial encounter: Secondary | ICD-10-CM | POA: Insufficient documentation

## 2017-11-29 DIAGNOSIS — Y998 Other external cause status: Secondary | ICD-10-CM | POA: Insufficient documentation

## 2017-11-29 DIAGNOSIS — S0083XA Contusion of other part of head, initial encounter: Secondary | ICD-10-CM | POA: Diagnosis not present

## 2017-11-29 DIAGNOSIS — S0990XA Unspecified injury of head, initial encounter: Secondary | ICD-10-CM | POA: Diagnosis present

## 2017-11-29 DIAGNOSIS — Y92019 Unspecified place in single-family (private) house as the place of occurrence of the external cause: Secondary | ICD-10-CM | POA: Diagnosis not present

## 2017-11-29 DIAGNOSIS — Z79899 Other long term (current) drug therapy: Secondary | ICD-10-CM | POA: Insufficient documentation

## 2017-11-29 DIAGNOSIS — S7001XA Contusion of right hip, initial encounter: Secondary | ICD-10-CM

## 2017-11-29 DIAGNOSIS — I10 Essential (primary) hypertension: Secondary | ICD-10-CM | POA: Diagnosis not present

## 2017-11-29 DIAGNOSIS — Y93E5 Activity, floor mopping and cleaning: Secondary | ICD-10-CM | POA: Diagnosis not present

## 2017-11-29 DIAGNOSIS — R52 Pain, unspecified: Secondary | ICD-10-CM

## 2017-11-29 DIAGNOSIS — Z87891 Personal history of nicotine dependence: Secondary | ICD-10-CM | POA: Insufficient documentation

## 2017-11-29 DIAGNOSIS — Z7982 Long term (current) use of aspirin: Secondary | ICD-10-CM | POA: Insufficient documentation

## 2017-11-29 DIAGNOSIS — W19XXXA Unspecified fall, initial encounter: Secondary | ICD-10-CM

## 2017-11-29 LAB — COMPREHENSIVE METABOLIC PANEL
ALT: 10 U/L — AB (ref 14–54)
ANION GAP: 9 (ref 5–15)
AST: 21 U/L (ref 15–41)
Albumin: 3.4 g/dL — ABNORMAL LOW (ref 3.5–5.0)
Alkaline Phosphatase: 90 U/L (ref 38–126)
BUN: 20 mg/dL (ref 6–20)
CO2: 26 mmol/L (ref 22–32)
CREATININE: 1.35 mg/dL — AB (ref 0.44–1.00)
Calcium: 9.4 mg/dL (ref 8.9–10.3)
Chloride: 103 mmol/L (ref 101–111)
GFR, EST AFRICAN AMERICAN: 39 mL/min — AB (ref >60)
GFR, EST NON AFRICAN AMERICAN: 33 mL/min — AB (ref >60)
Glucose, Bld: 94 mg/dL (ref 65–99)
POTASSIUM: 3.8 mmol/L (ref 3.5–5.1)
SODIUM: 138 mmol/L (ref 135–145)
Total Bilirubin: 0.4 mg/dL (ref 0.3–1.2)
Total Protein: 7.1 g/dL (ref 6.5–8.1)

## 2017-11-29 LAB — URINALYSIS, ROUTINE W REFLEX MICROSCOPIC
BILIRUBIN URINE: NEGATIVE
Glucose, UA: NEGATIVE mg/dL
Hgb urine dipstick: NEGATIVE
Ketones, ur: NEGATIVE mg/dL
Nitrite: NEGATIVE
PROTEIN: NEGATIVE mg/dL
SPECIFIC GRAVITY, URINE: 1.006 (ref 1.005–1.030)
pH: 6 (ref 5.0–8.0)

## 2017-11-29 LAB — CBC WITH DIFFERENTIAL/PLATELET
Basophils Absolute: 0 10*3/uL (ref 0.0–0.1)
Basophils Relative: 0 %
EOS ABS: 0.2 10*3/uL (ref 0.0–0.7)
Eosinophils Relative: 2 %
HEMATOCRIT: 34.1 % — AB (ref 36.0–46.0)
Hemoglobin: 10.7 g/dL — ABNORMAL LOW (ref 12.0–15.0)
LYMPHS ABS: 1.5 10*3/uL (ref 0.7–4.0)
LYMPHS PCT: 16 %
MCH: 25.4 pg — AB (ref 26.0–34.0)
MCHC: 31.4 g/dL (ref 30.0–36.0)
MCV: 81 fL (ref 78.0–100.0)
MONOS PCT: 8 %
Monocytes Absolute: 0.7 10*3/uL (ref 0.1–1.0)
NEUTROS ABS: 6.9 10*3/uL (ref 1.7–7.7)
Neutrophils Relative %: 74 %
Platelets: 334 10*3/uL (ref 150–400)
RBC: 4.21 MIL/uL (ref 3.87–5.11)
RDW: 17.7 % — ABNORMAL HIGH (ref 11.5–15.5)
WBC: 9.3 10*3/uL (ref 4.0–10.5)

## 2017-11-29 LAB — TYPE AND SCREEN
ABO/RH(D): A POS
ANTIBODY SCREEN: NEGATIVE

## 2017-11-29 LAB — PROTIME-INR
INR: 1.06
PROTHROMBIN TIME: 13.7 s (ref 11.4–15.2)

## 2017-11-29 MED ORDER — ONDANSETRON HCL 4 MG/2ML IJ SOLN
4.0000 mg | Freq: Once | INTRAMUSCULAR | Status: DC
  Filled 2017-11-29: qty 2

## 2017-11-29 MED ORDER — FENTANYL CITRATE (PF) 100 MCG/2ML IJ SOLN
50.0000 ug | INTRAMUSCULAR | Status: DC | PRN
  Filled 2017-11-29: qty 2

## 2017-11-29 MED ORDER — SODIUM CHLORIDE 0.9 % IV SOLN
INTRAVENOUS | Status: DC
  Administered 2017-11-29: 22:00:00 via INTRAVENOUS

## 2017-11-29 NOTE — ED Triage Notes (Addendum)
Pt BIB [facility] from home. She had an unwitnessed fall today at home. She reports that she hit her head. She got up by herself. Denies LOC. Also pain in R hip, but she thinks that it is her arthritis. A&Ox4. HOH.

## 2017-11-29 NOTE — ED Provider Notes (Signed)
[facility] DEPT Provider Note   CSN: [id number] Arrival date & time: 11/29/17  1922     History   Chief Complaint No chief complaint on file.   HPI Shannon Cruz is a 84 y.o. female.  Pt presents to the ED today s/p fall.  Pt said she normally walks with a walker and was cleaning up her house.  She said she tripped and fell.  She hit her head.  She was able to get up by herself and walk.  The pt has some right hip pain, but she thinks that may be her arthritis.      Past Medical History:  Diagnosis Date  . Arthritis   . H/O diastolic dysfunction   . Heart palpitations   . Hypertension   . RBBB (right bundle branch block) 11/20/2010   Echo - EF >55%; flow pattern suggestive of impaired LV relaxation, proximal septal thickening noted; mitral valve mildly thickened; mild mitral annular calcification; aortic root sclerosis/calcification    Patient Active Problem List   Diagnosis Date Noted  . Bilateral leg edema 06/24/2017  . Abdominal pain 06/24/2017  . Atrial bigeminy 10/09/2013  . HTN (hypertension) 03/19/2013  . RBBB (right bundle branch block) 03/19/2013  . Left shoulder pain 03/19/2013    No past surgical history on file.  OB History    No data available       Home Medications    Prior to Admission medications   Medication Sig Start Date End Date Taking? Authorizing Provider  acetaminophen (TYLENOL) 500 MG tablet Take 500 mg by mouth every 6 (six) hours as needed for mild pain.   Yes [provider]  aspirin 81 MG chewable tablet Chew 81 mg by mouth daily.   Yes [provider]  diltiazem (CARDIZEM) 60 MG tablet Take 1 tablet (60 mg total) by mouth 2 (two) times daily. 06/26/17  Yes Dixie Dials, MD  metoprolol tartrate (LOPRESSOR) 25 MG tablet Take 1 tablet (25 mg total) by mouth 2 (two) times daily. 06/26/17  Yes Dixie Dials, MD  nitroGLYCERIN (NITROSTAT) 0.4 MG SL tablet Place 0.4 mg under the tongue  every 5 (five) minutes as needed for chest pain.   Yes [provider]  potassium chloride (K-DUR,KLOR-CON) 10 MEQ tablet Take 1 tablet (10 mEq total) by mouth daily. 06/26/17  Yes Dixie Dials, MD  RESTASIS 0.05 % ophthalmic emulsion Place 1 drop into both eyes at bedtime. 09/20/17  Yes [provider]  furosemide (LASIX) 20 MG tablet Take 1 tablet (20 mg total) by mouth daily. Patient not taking: Reported on 11/29/2017 06/27/17   Dixie Dials, MD  vitamin C (VITAMIN C) 250 MG tablet Take 1 tablet (250 mg total) by mouth daily with breakfast. Patient not taking: Reported on 09/26/2017 06/27/17   Dixie Dials, MD    Family History No family history on file.  Social History Social History   Tobacco Use  . Smoking status: Former Smoker    Last attempt to quit: 11/05/1982    Years since quitting: 35.0  . Smokeless tobacco: Never Used  Substance Use Topics  . Alcohol use: No  . Drug use: No     Allergies   Patient has no known allergies.   Review of Systems Review of Systems  Musculoskeletal:       Right hip  Neurological: Positive for headaches.  All other systems reviewed and are negative.    Physical Exam Updated Vital Signs BP (!) 167/57 (BP Location:  Right Arm)   Pulse (!) 57   Temp 98.4 F (36.9 C) (Oral)   Resp 17   SpO2 100%   Physical Exam  Constitutional: She is oriented to person, place, and time. She appears well-developed and well-nourished.  HENT:  Head: Normocephalic.    Right Ear: External ear normal.  Left Ear: External ear normal.  Nose: Nose normal.  Mouth/Throat: Oropharynx is clear and moist.  Eyes: Conjunctivae and EOM are normal. Pupils are equal, round, and reactive to light.  Neck: Normal range of motion. Neck supple.  Cardiovascular: Normal rate, regular rhythm, normal heart sounds and intact distal pulses.  Pulmonary/Chest: Effort normal and breath sounds normal.  Abdominal: Soft. Bowel sounds are normal.    Musculoskeletal: Normal range of motion.  Neurological: She is alert and oriented to person, place, and time.  Skin: Skin is warm and dry. Capillary refill takes less than 2 seconds.  Psychiatric: She has a normal mood and affect. Her behavior is normal. Judgment and thought content normal.  Nursing note and vitals reviewed.    ED Treatments / Results  Labs (all labs ordered are listed, but only abnormal results are displayed) Labs Reviewed  COMPREHENSIVE METABOLIC PANEL - Abnormal; Notable for the following components:      Result Value   Creatinine, Ser 1.35 (*)    Albumin 3.4 (*)    ALT 10 (*)    GFR calc non Af Amer 33 (*)    GFR calc Af Amer 39 (*)    All other components within normal limits  CBC WITH DIFFERENTIAL/PLATELET - Abnormal; Notable for the following components:   Hemoglobin 10.7 (*)    HCT 34.1 (*)    MCH 25.4 (*)    RDW 17.7 (*)    All other components within normal limits  URINALYSIS, ROUTINE W REFLEX MICROSCOPIC - Abnormal; Notable for the following components:   Color, Urine STRAW (*)    Leukocytes, UA TRACE (*)    Bacteria, UA RARE (*)    Squamous Epithelial / LPF 0-5 (*)    All other components within normal limits  PROTIME-INR  TYPE AND SCREEN  ABO/RH    EKG  EKG Interpretation  Date/Time:  Friday November 29 2017 20:04:01 EST Ventricular Rate:  61 PR Interval:    QRS Duration: 147 QT Interval:  449 QTC Calculation: 453 R Axis:   -64 Text Interpretation:  Sinus rhythm Prolonged PR interval Consider left atrial enlargement RBBB and LAFB pt back in nsr compared to August 2018 otherwise no significant change Confirmed by Isla Pence [phone]) on 11/29/2017 8:19:27 PM       Radiology Dg Chest 2 View  Result Date: 11/29/2017 CLINICAL DATA:  Fall with right hip pain EXAM: CHEST  2 VIEW COMPARISON:  Chest radiograph 04/05/2016 FINDINGS: Unchanged cardiomegaly. The lungs are clear. No pleural effusion or pneumothorax. IMPRESSION: Unchanged  cardiomegaly.  Clear lungs. Electronically Signed   By: Ulyses Jarred M.D.   On: 11/29/2017 20:33   Ct Head Wo Contrast  Result Date: 11/29/2017 CLINICAL DATA:  Status post fall.  Struck head. EXAM: CT HEAD WITHOUT CONTRAST CT CERVICAL SPINE WITHOUT CONTRAST TECHNIQUE: Multidetector CT imaging of the head and cervical spine was performed following the standard protocol without intravenous contrast. Multiplanar CT image reconstructions of the cervical spine were also generated. COMPARISON:  Head CT 06/01/2016 FINDINGS: CT HEAD FINDINGS Brain: There is no evidence for acute hemorrhage, hydrocephalus, mass lesion, or abnormal extra-axial fluid collection. No definite CT evidence for  acute infarction. Diffuse loss of parenchymal volume is consistent with atrophy. Patchy low attenuation in the deep hemispheric and periventricular white matter is nonspecific, but likely reflects chronic microvascular ischemic demyelination. Vascular: Atherosclerotic calcification of the carotid siphons noted at the skull base. No dense MCA sign. Skull: No evidence for fracture. No worrisome lytic or sclerotic lesion. Sinuses/Orbits: The visualized paranasal sinuses and mastoid air cells are clear. Visualized portions of the globes and intraorbital fat are unremarkable. Other: Scalp swelling noted left frontal region. CT CERVICAL SPINE FINDINGS Alignment: Straightening of normal cervical lordosis. Skull base and vertebrae: No acute fracture. No primary bone lesion or focal pathologic process. Soft tissues and spinal canal: No prevertebral fluid or swelling. No visible canal hematoma. Disc levels: Diffuse loss of intervertebral disc height is seen at all levels from C2-3 down to C7-T1. Endplate spurring is associated. Diffuse facet osteoarthritis noted bilaterally. Upper chest: Negative. Other: None. IMPRESSION: 1. No acute intracranial abnormality. Atrophy with chronic small vessel white matter ischemic disease. 2. No cervical spine  fracture.  Diffuse degenerative changes. 3. Loss of cervical lordosis. This can be related to patient positioning, muscle spasm or soft tissue injury. Electronically Signed   By: Misty Stanley M.D.   On: 11/29/2017 20:49   Ct Cervical Spine Wo Contrast  Result Date: 11/29/2017 CLINICAL DATA:  Status post fall.  Struck head. EXAM: CT HEAD WITHOUT CONTRAST CT CERVICAL SPINE WITHOUT CONTRAST TECHNIQUE: Multidetector CT imaging of the head and cervical spine was performed following the standard protocol without intravenous contrast. Multiplanar CT image reconstructions of the cervical spine were also generated. COMPARISON:  Head CT 06/01/2016 FINDINGS: CT HEAD FINDINGS Brain: There is no evidence for acute hemorrhage, hydrocephalus, mass lesion, or abnormal extra-axial fluid collection. No definite CT evidence for acute infarction. Diffuse loss of parenchymal volume is consistent with atrophy. Patchy low attenuation in the deep hemispheric and periventricular white matter is nonspecific, but likely reflects chronic microvascular ischemic demyelination. Vascular: Atherosclerotic calcification of the carotid siphons noted at the skull base. No dense MCA sign. Skull: No evidence for fracture. No worrisome lytic or sclerotic lesion. Sinuses/Orbits: The visualized paranasal sinuses and mastoid air cells are clear. Visualized portions of the globes and intraorbital fat are unremarkable. Other: Scalp swelling noted left frontal region. CT CERVICAL SPINE FINDINGS Alignment: Straightening of normal cervical lordosis. Skull base and vertebrae: No acute fracture. No primary bone lesion or focal pathologic process. Soft tissues and spinal canal: No prevertebral fluid or swelling. No visible canal hematoma. Disc levels: Diffuse loss of intervertebral disc height is seen at all levels from C2-3 down to C7-T1. Endplate spurring is associated. Diffuse facet osteoarthritis noted bilaterally. Upper chest: Negative. Other: None.  IMPRESSION: 1. No acute intracranial abnormality. Atrophy with chronic small vessel white matter ischemic disease. 2. No cervical spine fracture.  Diffuse degenerative changes. 3. Loss of cervical lordosis. This can be related to patient positioning, muscle spasm or soft tissue injury. Electronically Signed   By: Misty Stanley M.D.   On: 11/29/2017 20:49   Dg Hip Unilat With Pelvis 2-3 Views Right  Result Date: 11/29/2017 CLINICAL DATA:  Fall with hip pain EXAM: DG HIP (WITH OR WITHOUT PELVIS) 2-3V RIGHT COMPARISON:  CT abdomen pelvis 06/20/2017 FINDINGS: Pubic symphysis and rami are intact. Mild degenerative changes of the left hip. Calcified phleboliths in the pelvis. Possible calcified uterine fibroids. Severe arthritis of the right hip. No definite acute fracture or malalignment. Right lower quadrant calcification corresponds to a round coarse calcification on  CT. IMPRESSION: Advanced arthritis of the right hip. No definite acute osseous abnormality. Electronically Signed   By: Donavan Foil M.D.   On: 11/29/2017 20:35    Procedures Procedures (including critical care time)  Medications Ordered in ED Medications  0.9 %  sodium chloride infusion ( Intravenous New Bag/Given 11/29/17 2203)  fentaNYL (SUBLIMAZE) injection 50 mcg (not administered)  ondansetron (ZOFRAN) injection 4 mg (not administered)     Initial Impression / Assessment and Plan / ED Course  I have reviewed the triage vital signs and the nursing notes.  Pertinent labs & imaging results that were available during my care of the patient were reviewed by me and considered in my medical decision making (see chart for details).     Pt is able to ambulate with a walker.  She looks good.  She knows to return if worse.  Final Clinical Impressions(s) / ED Diagnoses   Final diagnoses:  Fall, initial encounter  Contusion of forehead, initial encounter  Contusion of right hip, initial encounter    ED Discharge Orders    None         Isla Pence, MD 11/29/17 2350

## 2017-11-29 NOTE — ED Notes (Signed)
Bed: WA03 Expected date:  Expected time:  Means of arrival:  Comments: EMS 

## 2017-11-29 NOTE — ED Notes (Signed)
Patient transported to CT 

## 2017-11-29 NOTE — ED Notes (Signed)
Pt ambulated from her room to the restroom and back. Pt was steady on her feet.

## 2017-11-30 LAB — ABO/RH: ABO/RH(D): A POS

## 2018-02-25 DIAGNOSIS — R072 Precordial pain: Secondary | ICD-10-CM | POA: Diagnosis not present

## 2018-02-25 DIAGNOSIS — I10 Essential (primary) hypertension: Secondary | ICD-10-CM | POA: Diagnosis not present

## 2018-02-25 DIAGNOSIS — M25561 Pain in right knee: Secondary | ICD-10-CM | POA: Diagnosis not present

## 2018-02-25 DIAGNOSIS — M25562 Pain in left knee: Secondary | ICD-10-CM | POA: Diagnosis not present

## 2018-02-27 ENCOUNTER — Emergency Department (HOSPITAL_COMMUNITY)
Admission: EM | Admit: 2018-02-27 | Discharge: 2018-02-27 | Disposition: A | Discharge status: Home / Self Care | Payer: Medicare Other | Attending: Emergency Medicine | Admitting: Emergency Medicine

## 2018-02-27 ENCOUNTER — Emergency Department (HOSPITAL_COMMUNITY): Payer: Medicare Other

## 2018-02-27 ENCOUNTER — Encounter (HOSPITAL_COMMUNITY): Payer: Self-pay

## 2018-02-27 ENCOUNTER — Other Ambulatory Visit: Payer: Self-pay

## 2018-02-27 DIAGNOSIS — Z87891 Personal history of nicotine dependence: Secondary | ICD-10-CM | POA: Insufficient documentation

## 2018-02-27 DIAGNOSIS — R0602 Shortness of breath: Secondary | ICD-10-CM | POA: Diagnosis not present

## 2018-02-27 DIAGNOSIS — I10 Essential (primary) hypertension: Secondary | ICD-10-CM | POA: Diagnosis not present

## 2018-02-27 DIAGNOSIS — Z79899 Other long term (current) drug therapy: Secondary | ICD-10-CM | POA: Diagnosis not present

## 2018-02-27 DIAGNOSIS — Z7982 Long term (current) use of aspirin: Secondary | ICD-10-CM | POA: Diagnosis not present

## 2018-02-27 DIAGNOSIS — M546 Pain in thoracic spine: Secondary | ICD-10-CM | POA: Diagnosis not present

## 2018-02-27 DIAGNOSIS — R000 Tachycardia, unspecified: Secondary | ICD-10-CM | POA: Diagnosis not present

## 2018-02-27 DIAGNOSIS — N390 Urinary tract infection, site not specified: Secondary | ICD-10-CM | POA: Diagnosis not present

## 2018-02-27 DIAGNOSIS — M5489 Other dorsalgia: Secondary | ICD-10-CM | POA: Diagnosis not present

## 2018-02-27 DIAGNOSIS — I4510 Unspecified right bundle-branch block: Secondary | ICD-10-CM | POA: Diagnosis not present

## 2018-02-27 LAB — URINALYSIS, ROUTINE W REFLEX MICROSCOPIC
Bacteria, UA: NONE SEEN
Bilirubin Urine: NEGATIVE
Glucose, UA: NEGATIVE mg/dL
Hgb urine dipstick: NEGATIVE
Ketones, ur: NEGATIVE mg/dL
Nitrite: NEGATIVE
Protein, ur: NEGATIVE mg/dL
Specific Gravity, Urine: 1.01 (ref 1.005–1.030)
pH: 6 (ref 5.0–8.0)

## 2018-02-27 MED ORDER — CEPHALEXIN 500 MG PO CAPS: 500.0000 mg | ORAL_CAPSULE | Freq: Two times a day (BID) | ORAL | 0 refills | Status: DC

## 2018-02-27 NOTE — Discharge Instructions (Signed)
Take Keflex twice daily for your urinary tract infection.  A urine culture will be sent and you will be called if there needs to be any change in your antibody.  Take Tylenol as prescribed over-the-counter, as needed for your pain.  You can use a heating pad on the low setting 15 minutes on, 15 minutes off.  Please follow-up with your doctor in 3 to 4 days for recheck.  Please return to the emergency department if you develop any new or worsening symptoms.

## 2018-02-27 NOTE — ED Provider Notes (Signed)
   Face-to-face evaluation   History: Patient is here for evaluation of nervous behavior, with low back pain.  She came by EMS for evaluation.  Her son states that she is currently at her baseline.  Physical exam: Alert, elderly female who is comfortable.  Heart regular rate and rhythm without murmur lungs clear to auscultation.  Patient is lucid.  She is somewhat hard of hearing.  Medical screening examination/treatment/procedure(s) were conducted as a shared visit with non-physician practitioner(s) and myself.  I personally evaluated the patient during the encounter     Daleen Bo, MD 02/27/18 2045

## 2018-02-27 NOTE — ED Provider Notes (Signed)
[facility] DEPT Provider Note   CSN: [id number] Arrival date & time: 02/27/18  1328     History   Chief Complaint No chief complaint on file.   HPI Shannon Cruz is a 84 y.o. female with history of hypertension, intermittent heart palpitations, right bundle branch block who presents  with a 2-day history of thoracic back pain.  Patient also reports episode of heart racing and feeling hot.  Patient was having some shortness of breath.  She reports this occurred after she got nervous about someone outside of her house.  She is also been under stress lately because her sister is in the hospital.  Patient is feeling back to baseline now and patient's son reports she has been her normal self since he saw her at her house around noon.  She denies any injury related to her back pain.  Her back pain is mild and worse when she lays down.  Is worse with palpation.  She denies any urinary symptoms.  She denies any abdominal pain, nausea, vomiting, chest pain shortness of breath at this time.  HPI  Past Medical History:  Diagnosis Date  . Arthritis   . H/O diastolic dysfunction   . Heart palpitations   . Hypertension   . RBBB (right bundle branch block) 11/20/2010   Echo - EF >55%; flow pattern suggestive of impaired LV relaxation, proximal septal thickening noted; mitral valve mildly thickened; mild mitral annular calcification; aortic root sclerosis/calcification    Patient Active Problem List   Diagnosis Date Noted  . Bilateral leg edema 06/24/2017  . Abdominal pain 06/24/2017  . Atrial bigeminy 10/09/2013  . HTN (hypertension) 03/19/2013  . RBBB (right bundle branch block) 03/19/2013  . Left shoulder pain 03/19/2013    History reviewed. No pertinent surgical history.   OB History   None      Home Medications    Prior to Admission medications   Medication Sig Start Date End Date Taking? Authorizing Provider  acetaminophen (TYLENOL) 500 MG  tablet Take 500 mg by mouth every 6 (six) hours as needed for mild pain.    [provider]  aspirin 81 MG chewable tablet Chew 81 mg by mouth daily.    [provider]  cephALEXin (KEFLEX) 500 MG capsule Take 1 capsule (500 mg total) by mouth 2 (two) times daily. 02/27/18   Abriel Geesey, Bea Graff, PA-C  diltiazem (CARDIZEM) 60 MG tablet Take 1 tablet (60 mg total) by mouth 2 (two) times daily. 06/26/17   Dixie Dials, MD  furosemide (LASIX) 20 MG tablet Take 1 tablet (20 mg total) by mouth daily. Patient not taking: Reported on 11/29/2017 06/27/17   Dixie Dials, MD  metoprolol tartrate (LOPRESSOR) 25 MG tablet Take 1 tablet (25 mg total) by mouth 2 (two) times daily. 06/26/17   Dixie Dials, MD  nitroGLYCERIN (NITROSTAT) 0.4 MG SL tablet Place 0.4 mg under the tongue every 5 (five) minutes as needed for chest pain.    [provider]  potassium chloride (K-DUR,KLOR-CON) 10 MEQ tablet Take 1 tablet (10 mEq total) by mouth daily. 06/26/17   Dixie Dials, MD  RESTASIS 0.05 % ophthalmic emulsion Place 1 drop into both eyes at bedtime. 09/20/17   [provider]  vitamin C (VITAMIN C) 250 MG tablet Take 1 tablet (250 mg total) by mouth daily with breakfast. Patient not taking: Reported on 09/26/2017 06/27/17   Dixie Dials, MD    Family History History reviewed. No pertinent family history.  Social History Social History   Tobacco Use  . Smoking status: Former Smoker    Last attempt to quit: 11/05/1982    Years since quitting: 35.3  . Smokeless tobacco: Never Used  Substance Use Topics  . Alcohol use: No  . Drug use: No     Allergies   Patient has no known allergies.   Review of Systems Review of Systems  Constitutional: Negative for chills and fever.  HENT: Negative for facial swelling and sore throat.   Respiratory: Negative for shortness of breath (resolved).   Cardiovascular: Negative for chest pain and palpitations (resolved).  Gastrointestinal:  Negative for abdominal pain, nausea and vomiting.  Genitourinary: Negative for dysuria and frequency.  Musculoskeletal: Positive for back pain.  Skin: Negative for rash and wound.  Neurological: Negative for headaches.  Psychiatric/Behavioral: The patient is not nervous/anxious.      Physical Exam Updated Vital Signs BP (!) 165/70 (BP Location: Right Arm)   Pulse 68   Temp 98.6 F (37 C) (Oral)   Resp 18   Ht 5\' 4"  (1.626 m)   Wt 57.2 kg (126 lb)   SpO2 100%   BMI 21.63 kg/m   Physical Exam  Constitutional: She appears well-developed and well-nourished. No distress.  HENT:  Head: Normocephalic and atraumatic.  Mouth/Throat: Oropharynx is clear and moist. No oropharyngeal exudate.  Eyes: Pupils are equal, round, and reactive to light. Conjunctivae are normal. Right eye exhibits no discharge. Left eye exhibits no discharge. No scleral icterus.  Neck: Normal range of motion. Neck supple. No thyromegaly present.  Cardiovascular: Normal rate, regular rhythm, normal heart sounds and intact distal pulses. Exam reveals no gallop and no friction rub.  No murmur heard. Pulmonary/Chest: Effort normal and breath sounds normal. No stridor. No respiratory distress. She has no wheezes. She has no rales.  Abdominal: Soft. Bowel sounds are normal. She exhibits no distension. There is tenderness (mild) in the suprapubic area. There is no rebound and no guarding.  Musculoskeletal: She exhibits no edema.  Mild midline thoracic tenderness, worse on the left paraspinal 5/5 strength to bilateral lower extremities, normal sensation, equal bilateral grip strength; 5/5 strength to upper extremities bilaterally  Lymphadenopathy:    She has no cervical adenopathy.  Neurological: She is alert. Coordination normal.  Skin: Skin is warm and dry. No rash noted. She is not diaphoretic. No pallor.  Psychiatric: She has a normal mood and affect.  Nursing note and vitals reviewed.    ED Treatments / Results    Labs (all labs ordered are listed, but only abnormal results are displayed) Labs Reviewed  URINALYSIS, ROUTINE W REFLEX MICROSCOPIC - Abnormal; Notable for the following components:      Result Value   APPearance HAZY (*)    Leukocytes, UA MODERATE (*)    All other components within normal limits  URINE CULTURE    EKG EKG Interpretation  Date/Time:  Friday November 29 2017 20:04:01 EST Ventricular Rate:  61 PR Interval:    QRS Duration: 147 QT Interval:  449 QTC Calculation: 453 R Axis:   -64 Text Interpretation:  Sinus rhythm Prolonged PR interval Consider left atrial enlargement RBBB and LAFB pt back in nsr compared to August 2018 otherwise no significant change Confirmed by Isla Pence [phone]) on 11/29/2017 8:19:27 PM Also confirmed by Isla Pence [phone]), editor Lynder Parents [phone])  on 02/27/2018 3:00:50 PM   Radiology Dg Thoracic Spine 2 View  Result Date: 02/27/2018 CLINICAL DATA:  Mid upper back pain over  the last 2 days. Confusion. EXAM: THORACIC SPINE 2 VIEWS COMPARISON:  Chest radiography 11/29/2017 FINDINGS: Chronic degenerative spondylosis throughout the thoracic region with disc space narrowing and marginal osteophytes. No evidence of fracture or other acute focal bone finding. IMPRESSION: Chronic degenerative spondylosis.  No focal or acute finding. Electronically Signed   By: Nelson Chimes M.D.   On: 02/27/2018 17:35    Procedures Procedures (including critical care time)  Medications Ordered in ED Medications - No data to display   Initial Impression / Assessment and Plan / ED Course  I have reviewed the triage vital signs and the nursing notes.  Pertinent labs & imaging results that were available during my care of the patient were reviewed by me and considered in my medical decision making (see chart for details).     Patient presenting  with a 2-day history of low back pain and after an episode of her palpitations and shortness of breath after  patient was stressed about  hearing something outside.  Patient lives alone.  Patient has returned to baseline.  She is feeling much better after eating and drinking something, she had not eaten anything today at all.  Patient is also been stressed about her sister who is in the hospital.  Thoracic x-ray shows chronic degenerative spondylosis, no focal or acute finding.  UA shows moderate leukocytes, 11-20 WBCs.  Will treat with Keflex.  Culture sent.  EKG shows NSR, unchanged from past except patient is back in NSR from last EKG.  Supportive treatment discussed including heating pad, Tylenol to the back.  Follow-up to PCP in 3 to 4 days.  Strict return precautions given to patient and son.  They understand and agree with plan.  Patient vitals stable her IV course and discharged in satisfactory condition. Patient also evaluated by Dr. Eulis Foster who got the patient's management and agrees with plan.    Final Clinical Impressions(s) / ED Diagnoses   Final diagnoses:  Acute lower UTI    ED Discharge Orders        Ordered    cephALEXin (KEFLEX) 500 MG capsule  2 times daily     02/27/18 1749       Frederica Kuster, PA-C 02/27/18 2009    Daleen Bo, MD 02/27/18 2045

## 2018-02-27 NOTE — ED Triage Notes (Signed)
Pt arrived via EMS from home. Pt lives alone. Pt c/o lower back pain and feeling anxious. Pt reported to EMS that she had heard noises around the house (neighbors)  last night and her sister is in the hospital and contributed to her anxiety    EMS v/s HR 100 RR 16  BP  130/86 100RA

## 2018-03-01 LAB — URINE CULTURE

## 2018-04-09 ENCOUNTER — Encounter (HOSPITAL_COMMUNITY): Payer: Self-pay

## 2018-04-09 ENCOUNTER — Emergency Department (HOSPITAL_COMMUNITY)
Admission: EM | Admit: 2018-04-09 | Discharge: 2018-04-09 | Disposition: A | Discharge status: Home / Self Care | Payer: Medicare Other | Attending: Emergency Medicine | Admitting: Emergency Medicine

## 2018-04-09 ENCOUNTER — Other Ambulatory Visit: Payer: Self-pay

## 2018-04-09 DIAGNOSIS — N3000 Acute cystitis without hematuria: Secondary | ICD-10-CM | POA: Insufficient documentation

## 2018-04-09 DIAGNOSIS — Z7982 Long term (current) use of aspirin: Secondary | ICD-10-CM | POA: Insufficient documentation

## 2018-04-09 DIAGNOSIS — I4430 Unspecified atrioventricular block: Secondary | ICD-10-CM | POA: Diagnosis not present

## 2018-04-09 DIAGNOSIS — R531 Weakness: Secondary | ICD-10-CM

## 2018-04-09 DIAGNOSIS — Z79899 Other long term (current) drug therapy: Secondary | ICD-10-CM | POA: Insufficient documentation

## 2018-04-09 DIAGNOSIS — I10 Essential (primary) hypertension: Secondary | ICD-10-CM | POA: Diagnosis not present

## 2018-04-09 DIAGNOSIS — I491 Atrial premature depolarization: Secondary | ICD-10-CM | POA: Diagnosis not present

## 2018-04-09 DIAGNOSIS — I959 Hypotension, unspecified: Secondary | ICD-10-CM | POA: Diagnosis not present

## 2018-04-09 DIAGNOSIS — Z87891 Personal history of nicotine dependence: Secondary | ICD-10-CM | POA: Insufficient documentation

## 2018-04-09 DIAGNOSIS — I440 Atrioventricular block, first degree: Secondary | ICD-10-CM | POA: Diagnosis not present

## 2018-04-09 LAB — CBC
HEMATOCRIT: 40.2 % (ref 36.0–46.0)
Hemoglobin: 12.8 g/dL (ref 12.0–15.0)
MCH: 26.7 pg (ref 26.0–34.0)
MCHC: 31.8 g/dL (ref 30.0–36.0)
MCV: 83.9 fL (ref 78.0–100.0)
Platelets: 316 10*3/uL (ref 150–400)
RBC: 4.79 MIL/uL (ref 3.87–5.11)
RDW: 15.4 % (ref 11.5–15.5)
WBC: 12.7 10*3/uL — AB (ref 4.0–10.5)

## 2018-04-09 LAB — COMPREHENSIVE METABOLIC PANEL
ALT: 9 U/L — ABNORMAL LOW (ref 14–54)
ANION GAP: 7 (ref 5–15)
AST: 24 U/L (ref 15–41)
Albumin: 3.4 g/dL — ABNORMAL LOW (ref 3.5–5.0)
Alkaline Phosphatase: 88 U/L (ref 38–126)
BUN: 25 mg/dL — ABNORMAL HIGH (ref 6–20)
CHLORIDE: 104 mmol/L (ref 101–111)
CO2: 24 mmol/L (ref 22–32)
CREATININE: 1.59 mg/dL — AB (ref 0.44–1.00)
Calcium: 9.2 mg/dL (ref 8.9–10.3)
GFR calc non Af Amer: 27 mL/min — ABNORMAL LOW (ref >60)
GFR, EST AFRICAN AMERICAN: 32 mL/min — AB (ref >60)
Glucose, Bld: 97 mg/dL (ref 65–99)
Potassium: 3.9 mmol/L (ref 3.5–5.1)
SODIUM: 135 mmol/L (ref 135–145)
TOTAL PROTEIN: 7 g/dL (ref 6.5–8.1)
Total Bilirubin: 0.5 mg/dL (ref 0.3–1.2)

## 2018-04-09 LAB — CBG MONITORING, ED: GLUCOSE-CAPILLARY: 79 mg/dL (ref 65–99)

## 2018-04-09 LAB — URINALYSIS, ROUTINE W REFLEX MICROSCOPIC
BILIRUBIN URINE: NEGATIVE
Glucose, UA: NEGATIVE mg/dL
KETONES UR: NEGATIVE mg/dL
NITRITE: NEGATIVE
Protein, ur: NEGATIVE mg/dL
Specific Gravity, Urine: 1.009 (ref 1.005–1.030)
WBC, UA: >50 WBC/hpf — ABNORMAL HIGH (ref 0–5)
pH: 5 (ref 5.0–8.0)

## 2018-04-09 LAB — LIPASE, BLOOD: LIPASE: 29 U/L (ref 11–51)

## 2018-04-09 MED ORDER — SODIUM CHLORIDE 0.9 % IV SOLN
1.0000 g | Freq: Once | INTRAVENOUS | Status: AC
  Administered 2018-04-09: 1 g via INTRAVENOUS
  Filled 2018-04-09: qty 10

## 2018-04-09 MED ORDER — CEPHALEXIN 500 MG PO CAPS: 500.0000 mg | ORAL_CAPSULE | Freq: Two times a day (BID) | ORAL | 0 refills | Status: AC

## 2018-04-09 NOTE — ED Provider Notes (Signed)
[facility] DEPT Provider Note   CSN: [id number] Arrival date & time: 04/09/18  1429     History   Chief Complaint Chief Complaint  Patient presents with  . Weakness    HPI Shannon Cruz is a 84 y.o. female.  84 year old female with past medical history including hypertension, diastolic dysfunction, RBBB who presents with weakness.  History difficult due to patient hard of hearing. Patient reports that starting this morning she began feeling generally weak and hot like she was running a fever.  She reports recent burning with urination.  She reports some lower abdominal pain sometimes in her right side that has been there for about a week.  Son reports that she was treated for urinary tract infection about 1 month ago.  She reports some nausea but no vomiting, diarrhea, or other areas of pain.  Son denies any confusion or altered mental status.  The history is provided by the patient and a relative.  Weakness     Past Medical History:  Diagnosis Date  . Arthritis   . H/O diastolic dysfunction   . Heart palpitations   . Hypertension   . RBBB (right bundle branch block) 11/20/2010   Echo - EF >55%; flow pattern suggestive of impaired LV relaxation, proximal septal thickening noted; mitral valve mildly thickened; mild mitral annular calcification; aortic root sclerosis/calcification    Patient Active Problem List   Diagnosis Date Noted  . Bilateral leg edema 06/24/2017  . Abdominal pain 06/24/2017  . Atrial bigeminy 10/09/2013  . HTN (hypertension) 03/19/2013  . RBBB (right bundle branch block) 03/19/2013  . Left shoulder pain 03/19/2013    History reviewed. No pertinent surgical history.   OB History   None      Home Medications    Prior to Admission medications   Medication Sig Start Date End Date Taking? Authorizing Provider  acetaminophen (TYLENOL) 500 MG tablet Take 500 mg by mouth every 6 (six) hours as needed for mild pain.    Yes [provider]  aspirin 81 MG chewable tablet Chew 81 mg by mouth daily.   Yes [provider]  diltiazem (CARDIZEM) 60 MG tablet Take 1 tablet (60 mg total) by mouth 2 (two) times daily. 06/26/17  Yes Dixie Dials, MD  furosemide (LASIX) 20 MG tablet Take 1 tablet (20 mg total) by mouth daily. 06/27/17  Yes Dixie Dials, MD  metoprolol tartrate (LOPRESSOR) 25 MG tablet Take 1 tablet (25 mg total) by mouth 2 (two) times daily. 06/26/17  Yes Dixie Dials, MD  potassium chloride (K-DUR,KLOR-CON) 10 MEQ tablet Take 1 tablet (10 mEq total) by mouth daily. 06/26/17  Yes Dixie Dials, MD  RESTASIS 0.05 % ophthalmic emulsion Place 1 drop into both eyes at bedtime. 09/20/17  Yes [provider]  cephALEXin (KEFLEX) 500 MG capsule Take 1 capsule (500 mg total) by mouth 2 (two) times daily for 7 days. 04/09/18 04/16/18  Little, Wenda Overland, MD  nitroGLYCERIN (NITROSTAT) 0.4 MG SL tablet Place 0.4 mg under the tongue every 5 (five) minutes as needed for chest pain.    [provider]  vitamin C (VITAMIN C) 250 MG tablet Take 1 tablet (250 mg total) by mouth daily with breakfast. Patient not taking: Reported on 09/26/2017 06/27/17   Dixie Dials, MD    Family History No family history on file.  Social History Social History   Tobacco Use  . Smoking status: Former Smoker    Last attempt to quit: 11/05/1982  Years since quitting: 35.4  . Smokeless tobacco: Never Used  Substance Use Topics  . Alcohol use: No  . Drug use: No     Allergies   Patient has no known allergies.   Review of Systems Review of Systems  Neurological: Positive for weakness.   All other systems reviewed and are negative except that which was mentioned in HPI   Physical Exam Updated Vital Signs BP (!) 134/59   Pulse 71   Temp 98.5 F (36.9 C) (Oral)   Resp 19   SpO2 100%   Physical Exam  Constitutional: She is oriented to person, place, and time. She appears  well-developed. No distress.  Frail elderly woman awake and alert  HENT:  Head: Normocephalic and atraumatic.  Moist mucous membranes  Eyes: Pupils are equal, round, and reactive to light. Conjunctivae are normal.  Neck: Neck supple.  Cardiovascular: Normal rate, regular rhythm and normal heart sounds.  No murmur heard. Pulmonary/Chest: Effort normal and breath sounds normal.  Abdominal: Soft. Bowel sounds are normal. She exhibits no distension. There is tenderness.  Mild suprapubic and RLQ tenderness  Musculoskeletal: She exhibits no edema.  Neurological: She is alert and oriented to person, place, and time.  Fluent speech  Skin: Skin is warm and dry.  Psychiatric: She has a normal mood and affect. Judgment normal.  Nursing note and vitals reviewed.    ED Treatments / Results  Labs (all labs ordered are listed, but only abnormal results are displayed) Labs Reviewed  CBC - Abnormal; Notable for the following components:      Result Value   WBC 12.7 (*)    All other components within normal limits  URINALYSIS, ROUTINE W REFLEX MICROSCOPIC - Abnormal; Notable for the following components:   APPearance HAZY (*)    Hgb urine dipstick SMALL (*)    Leukocytes, UA LARGE (*)    WBC, UA >50 (*)    Bacteria, UA RARE (*)    All other components within normal limits  COMPREHENSIVE METABOLIC PANEL - Abnormal; Notable for the following components:   BUN 25 (*)    Creatinine, Ser 1.59 (*)    Albumin 3.4 (*)    ALT 9 (*)    GFR calc non Af Amer 27 (*)    GFR calc Af Amer 32 (*)    All other components within normal limits  URINE CULTURE  LIPASE, BLOOD  CBG MONITORING, ED    EKG EKG Interpretation  Date/Time:  Wednesday April 09 2018 15:01:13 EDT Ventricular Rate:  68 PR Interval:    QRS Duration: 154 QT Interval:  423 QTC Calculation: 450 R Axis:   -77 Text Interpretation:  Sinus or ectopic atrial rhythm Atrial premature complex Prolonged PR interval Right bundle branch  block Inferior infarct, old similar to previous Confirmed by Theotis Burrow [phone]) on 04/09/2018 3:46:31 PM   Radiology No results found.  Procedures Procedures (including critical care time)  Medications Ordered in ED Medications  cefTRIAXone (ROCEPHIN) 1 g in sodium chloride 0.9 % 100 mL IVPB (0 g Intravenous Stopped 04/09/18 1833)     Initial Impression / Assessment and Plan / ED Course  I have reviewed the triage vital signs and the nursing notes.  Pertinent labs that were available during my care of the patient were reviewed by me and considered in my medical decision making (see chart for details).     Patient was well-appearing and comfortable on exam with reassuring vital signs, afebrile.  Mild suprapubic tenderness.  Her lab work shows WBC 12.7, UA consistent with infection, creatinine 1.59 which is not significantly different from previous.  She has had no vomiting, back pain, or fevers here to suggest pyelonephritis.  Given that she is mentating appropriately and son states that she is at her baseline, I feel is appropriate to treat her with an outpatient course of antibiotics.  He does state that her symptoms did seem to completely resolve after previous antibiotic course so I do not feel that her current infection represents treatment failure.  Gave dose of ceftriaxone and provided with Keflex for home.  Instructed to see PCP for follow-up to ensure infection clears.  Extensively reviewed return precautions including fevers, vomiting, worsening pain, confusion, or any sudden changes in symptoms.  They voiced understanding and she was discharged in satisfactory condition.  Final Clinical Impressions(s) / ED Diagnoses   Final diagnoses:  Acute cystitis without hematuria  Weakness    ED Discharge Orders        Ordered    cephALEXin (KEFLEX) 500 MG capsule  2 times daily     04/09/18 1818       Little, Wenda Overland, MD 04/09/18 1840

## 2018-04-09 NOTE — Discharge Instructions (Signed)
RETURN TO ER IF YOU HAVE FEVERS, VOMITING, SEVERE PAIN, CONFUSION, OR WORSENING SYMPTOMS. FOLLOW UP WITH PRIMARY CARE DOCTOR IN 1 WEEK TO MAKE SURE INFECTION CLEARS WITH ANTIBIOTICS.

## 2018-04-09 NOTE — ED Notes (Signed)
Bed: WA04 Expected date:  Expected time:  Means of arrival:  Comments: EMS-UTI 

## 2018-04-09 NOTE — ED Triage Notes (Signed)
Pt arrived via EMS from home. Pt is alert and oriented x 4 and is verbally responsive. Pt is c/o of waking this morning and feeling weak and hot. Per EMS pt has hx of UTI, and reports some burning and discomfort with urination. Pt is c/o rt lower abdominal pain. Per EMS urine smelled malodorous.     EMS v/s 120/58, HR 64 RR 16, 99%RA CBG 117  Temp 99.1 Temporal.   20G left forarm

## 2018-04-10 LAB — URINE CULTURE: SPECIAL REQUESTS: NORMAL

## 2018-05-01 DIAGNOSIS — R072 Precordial pain: Secondary | ICD-10-CM | POA: Diagnosis not present

## 2018-05-01 DIAGNOSIS — M25562 Pain in left knee: Secondary | ICD-10-CM | POA: Diagnosis not present

## 2018-05-01 DIAGNOSIS — I10 Essential (primary) hypertension: Secondary | ICD-10-CM | POA: Diagnosis not present

## 2018-05-01 DIAGNOSIS — M25561 Pain in right knee: Secondary | ICD-10-CM | POA: Diagnosis not present

## 2018-05-06 DIAGNOSIS — Z79899 Other long term (current) drug therapy: Secondary | ICD-10-CM | POA: Diagnosis not present

## 2018-05-06 DIAGNOSIS — D649 Anemia, unspecified: Secondary | ICD-10-CM | POA: Diagnosis not present

## 2018-05-06 DIAGNOSIS — E785 Hyperlipidemia, unspecified: Secondary | ICD-10-CM | POA: Diagnosis not present

## 2018-05-06 DIAGNOSIS — E559 Vitamin D deficiency, unspecified: Secondary | ICD-10-CM | POA: Diagnosis not present

## 2018-05-06 DIAGNOSIS — E876 Hypokalemia: Secondary | ICD-10-CM | POA: Diagnosis not present

## 2018-06-22 ENCOUNTER — Encounter (HOSPITAL_COMMUNITY): Payer: Self-pay

## 2018-06-22 ENCOUNTER — Emergency Department (HOSPITAL_COMMUNITY)
Admission: EM | Admit: 2018-06-22 | Discharge: 2018-06-22 | Disposition: A | Discharge status: Home / Self Care | Payer: Medicare Other | Attending: Emergency Medicine | Admitting: Emergency Medicine

## 2018-06-22 ENCOUNTER — Other Ambulatory Visit: Payer: Self-pay

## 2018-06-22 DIAGNOSIS — Z7982 Long term (current) use of aspirin: Secondary | ICD-10-CM | POA: Insufficient documentation

## 2018-06-22 DIAGNOSIS — Z87891 Personal history of nicotine dependence: Secondary | ICD-10-CM | POA: Diagnosis not present

## 2018-06-22 DIAGNOSIS — R300 Dysuria: Secondary | ICD-10-CM | POA: Diagnosis not present

## 2018-06-22 DIAGNOSIS — Z79899 Other long term (current) drug therapy: Secondary | ICD-10-CM | POA: Insufficient documentation

## 2018-06-22 DIAGNOSIS — N3001 Acute cystitis with hematuria: Secondary | ICD-10-CM | POA: Insufficient documentation

## 2018-06-22 DIAGNOSIS — I10 Essential (primary) hypertension: Secondary | ICD-10-CM | POA: Diagnosis not present

## 2018-06-22 LAB — CBC WITH DIFFERENTIAL/PLATELET
BASOS ABS: 0 10*3/uL (ref 0.0–0.1)
BASOS PCT: 0 %
Eosinophils Absolute: 0.1 10*3/uL (ref 0.0–0.7)
Eosinophils Relative: 1 %
HEMATOCRIT: 37.1 % (ref 36.0–46.0)
HEMOGLOBIN: 11.8 g/dL — AB (ref 12.0–15.0)
LYMPHS PCT: 14 %
Lymphs Abs: 1.1 10*3/uL (ref 0.7–4.0)
MCH: 26.7 pg (ref 26.0–34.0)
MCHC: 31.8 g/dL (ref 30.0–36.0)
MCV: 83.9 fL (ref 78.0–100.0)
Monocytes Absolute: 0.6 10*3/uL (ref 0.1–1.0)
Monocytes Relative: 8 %
NEUTROS ABS: 5.9 10*3/uL (ref 1.7–7.7)
NEUTROS PCT: 77 %
Platelets: 402 10*3/uL — ABNORMAL HIGH (ref 150–400)
RBC: 4.42 MIL/uL (ref 3.87–5.11)
RDW: 15.5 % (ref 11.5–15.5)
WBC: 7.7 10*3/uL (ref 4.0–10.5)

## 2018-06-22 LAB — URINALYSIS, ROUTINE W REFLEX MICROSCOPIC
BILIRUBIN URINE: NEGATIVE
GLUCOSE, UA: NEGATIVE mg/dL
Ketones, ur: NEGATIVE mg/dL
NITRITE: NEGATIVE
PH: 6 (ref 5.0–8.0)
Protein, ur: NEGATIVE mg/dL
Specific Gravity, Urine: 1.012 (ref 1.005–1.030)

## 2018-06-22 LAB — COMPREHENSIVE METABOLIC PANEL
ALK PHOS: 89 U/L (ref 38–126)
ALT: 9 U/L (ref 0–44)
ANION GAP: 10 (ref 5–15)
AST: 22 U/L (ref 15–41)
Albumin: 3.6 g/dL (ref 3.5–5.0)
BUN: 21 mg/dL (ref 8–23)
CALCIUM: 9.5 mg/dL (ref 8.9–10.3)
CO2: 29 mmol/L (ref 22–32)
Chloride: 103 mmol/L (ref 98–111)
Creatinine, Ser: 1.53 mg/dL — ABNORMAL HIGH (ref 0.44–1.00)
GFR calc non Af Amer: 29 mL/min — ABNORMAL LOW (ref >60)
GFR, EST AFRICAN AMERICAN: 33 mL/min — AB (ref >60)
Glucose, Bld: 104 mg/dL — ABNORMAL HIGH (ref 70–99)
POTASSIUM: 3.6 mmol/L (ref 3.5–5.1)
Sodium: 142 mmol/L (ref 135–145)
TOTAL PROTEIN: 7.5 g/dL (ref 6.5–8.1)
Total Bilirubin: 0.5 mg/dL (ref 0.3–1.2)

## 2018-06-22 MED ORDER — PHENAZOPYRIDINE HCL 200 MG PO TABS: 200.0000 mg | ORAL_TABLET | Freq: Three times a day (TID) | ORAL | 0 refills | Status: DC

## 2018-06-22 MED ORDER — FOSFOMYCIN TROMETHAMINE 3 G PO PACK
3.0000 g | PACK | Freq: Once | ORAL | Status: AC
  Administered 2018-06-22: 3 g via ORAL
  Filled 2018-06-22: qty 3

## 2018-06-22 NOTE — ED Provider Notes (Signed)
[facility] DEPT Provider Note   CSN: [id number] Arrival date & time: 06/22/18  1709     History   Chief Complaint Chief Complaint  Patient presents with  . burning with urination    HPI Shannon Cruz is a 84 y.o. female.  HPI Presents via EMS due to concern of dysuria and visible blood during urination. Patient lives alone, takes only 2 medication, states that she is generally well. She has, however, had urinary tract infection, 1 month ago. She notes that since that time she has had mild persistent dysuria, though no new fever, or other substantial change from baseline condition. Today, while bathing she noticed a trace amount of blood, and with the persistent dysuria, and some mild discomfort in her right inguinal crease she presents for evaluation. It does not seem as though there are additional complaints, though the patient's hearing difficulty makes the HPI and review of systems somewhat questionable. EMS reports the patient was awake and alert, hemodynamically unremarkable in route. Past Medical History:  Diagnosis Date  . Arthritis   . H/O diastolic dysfunction   . Heart palpitations   . Hypertension   . RBBB (right bundle branch block) 11/20/2010   Echo - EF >55%; flow pattern suggestive of impaired LV relaxation, proximal septal thickening noted; mitral valve mildly thickened; mild mitral annular calcification; aortic root sclerosis/calcification    Patient Active Problem List   Diagnosis Date Noted  . Bilateral leg edema 06/24/2017  . Abdominal pain 06/24/2017  . Atrial bigeminy 10/09/2013  . HTN (hypertension) 03/19/2013  . RBBB (right bundle branch block) 03/19/2013  . Left shoulder pain 03/19/2013    History reviewed. No pertinent surgical history.   OB History   None      Home Medications    Prior to Admission medications   Medication Sig Start Date End Date Taking? Authorizing Provider  acetaminophen  (TYLENOL) 500 MG tablet Take 500 mg by mouth every 6 (six) hours as needed for mild pain.    [provider]  aspirin 81 MG chewable tablet Chew 81 mg by mouth daily.    [provider]  diltiazem (CARDIZEM) 60 MG tablet Take 1 tablet (60 mg total) by mouth 2 (two) times daily. 06/26/17   Dixie Dials, MD  furosemide (LASIX) 20 MG tablet Take 1 tablet (20 mg total) by mouth daily. 06/27/17   Dixie Dials, MD  metoprolol tartrate (LOPRESSOR) 25 MG tablet Take 1 tablet (25 mg total) by mouth 2 (two) times daily. 06/26/17   Dixie Dials, MD  nitroGLYCERIN (NITROSTAT) 0.4 MG SL tablet Place 0.4 mg under the tongue every 5 (five) minutes as needed for chest pain.    [provider]  potassium chloride (K-DUR,KLOR-CON) 10 MEQ tablet Take 1 tablet (10 mEq total) by mouth daily. 06/26/17   Dixie Dials, MD  RESTASIS 0.05 % ophthalmic emulsion Place 1 drop into both eyes at bedtime. 09/20/17   [provider]  vitamin C (VITAMIN C) 250 MG tablet Take 1 tablet (250 mg total) by mouth daily with breakfast. Patient not taking: Reported on 09/26/2017 06/27/17   Dixie Dials, MD    Family History History reviewed. No pertinent family history.  Social History Social History   Tobacco Use  . Smoking status: Former Smoker    Last attempt to quit: 11/05/1982    Years since quitting: 35.6  . Smokeless tobacco: Never Used  Substance Use Topics  . Alcohol use: No  . Drug use: No  Allergies   Patient has no known allergies.   Review of Systems Review of Systems  Constitutional:       Per HPI, otherwise negative  HENT:       Per HPI, otherwise negative  Respiratory:       Per HPI, otherwise negative  Cardiovascular:       Per HPI, otherwise negative  Gastrointestinal: Negative for vomiting.  Endocrine:       Negative aside from HPI  Genitourinary:       Neg aside from HPI   Musculoskeletal:       Per HPI, otherwise negative  Skin: Negative.     Neurological: Negative for syncope.     Physical Exam Updated Vital Signs BP 137/68 (BP Location: Right Arm)   Temp 99.1 F (37.3 C) (Oral)   Resp 16   Ht 5\' 5"  (1.651 m)   Wt 54.4 kg   SpO2 100%   BMI 19.97 kg/m   Physical Exam  Constitutional: She is oriented to person, place, and time. She appears well-developed and well-nourished. No distress.  HENT:  Head: Normocephalic and atraumatic.  Eyes: Conjunctivae and EOM are normal.  Cardiovascular: Normal rate and regular rhythm.  Pulmonary/Chest: Effort normal and breath sounds normal. No stridor. No respiratory distress.  Abdominal: She exhibits no distension.  Mild tenderness to palpation without appreciable deformity about the right lateral inguinal crease otherwise unremarkable abdominal exam  Musculoskeletal: She exhibits no edema.  Neurological: She is alert and oriented to person, place, and time.  Very poor hearing, mild atrophy otherwise unremarkable neurologic exam  Skin: Skin is warm and dry.  Psychiatric: She has a normal mood and affect.  Nursing note and vitals reviewed.    ED Treatments / Results  Labs (all labs ordered are listed, but only abnormal results are displayed) Labs Reviewed  COMPREHENSIVE METABOLIC PANEL - Abnormal; Notable for the following components:      Result Value   Glucose, Bld 104 (*)    Creatinine, Ser 1.53 (*)    GFR calc non Af Amer 29 (*)    GFR calc Af Amer 33 (*)    All other components within normal limits  CBC WITH DIFFERENTIAL/PLATELET - Abnormal; Notable for the following components:   Hemoglobin 11.8 (*)    Platelets 402 (*)    All other components within normal limits  URINALYSIS, ROUTINE W REFLEX MICROSCOPIC - Abnormal; Notable for the following components:   APPearance HAZY (*)    Hgb urine dipstick MODERATE (*)    Leukocytes, UA LARGE (*)    WBC, UA >50 (*)    Bacteria, UA FEW (*)    Non Squamous Epithelial 0-5 (*)    All other components within normal limits      Procedures Procedures (including critical care time)  Medications Ordered in ED Medications  fosfomycin (MONUROL) packet 3 g (has no administration in time range)     Initial Impression / Assessment and Plan / ED Course  I have reviewed the triage vital signs and the nursing notes.  Pertinent labs & imaging results that were available during my care of the patient were reviewed by me and considered in my medical decision making (see chart for details).     6:59 PM Patient in no distress. This elderly female presents with concern of ongoing dysuria, trace visible blood earlier today. Here she is awake, alert, in no distress, speaking clearly.  When patient is not hemodynamically unstable, has no fever, no evidence  for pyelonephritis, bacteremia, sepsis. She does, however, have evidence for UTI. Given her ongoing dysuria, patient will receive pyridium and will receive anabiotics here. Patient discharged in stable condition.  Final Clinical Impressions(s) / ED Diagnoses   Final diagnoses:  Acute cystitis with hematuria    ED Discharge Orders         Ordered    phenazopyridine (PYRIDIUM) 200 MG tablet  3 times daily     06/22/18 1901           Carmin Muskrat, MD 06/22/18 1901

## 2018-06-22 NOTE — Discharge Instructions (Signed)
As discussed, your evaluation today has been largely reassuring.  But, it is important that you monitor your condition carefully, and do not hesitate to return to the ED if you develop new, or concerning changes in your condition. ? ?Otherwise, please follow-up with your physician for appropriate ongoing care. ? ?

## 2018-06-22 NOTE — ED Notes (Signed)
Bed: KK44 Expected date:  Expected time:  Means of arrival:  Comments: 84 yo dysuria

## 2018-06-22 NOTE — ED Triage Notes (Signed)
Per EMS: Pt tx for UTI 1 month ago.  Pt states she finished meds. Pt states the burning never went away.

## 2018-07-31 DIAGNOSIS — I10 Essential (primary) hypertension: Secondary | ICD-10-CM | POA: Diagnosis not present

## 2018-07-31 DIAGNOSIS — M25551 Pain in right hip: Secondary | ICD-10-CM | POA: Diagnosis not present

## 2018-07-31 DIAGNOSIS — M25562 Pain in left knee: Secondary | ICD-10-CM | POA: Diagnosis not present

## 2018-07-31 DIAGNOSIS — M25561 Pain in right knee: Secondary | ICD-10-CM | POA: Diagnosis not present

## 2018-10-08 DIAGNOSIS — M25562 Pain in left knee: Secondary | ICD-10-CM | POA: Diagnosis not present

## 2018-10-08 DIAGNOSIS — M25561 Pain in right knee: Secondary | ICD-10-CM | POA: Diagnosis not present

## 2018-10-08 DIAGNOSIS — E440 Moderate protein-calorie malnutrition: Secondary | ICD-10-CM | POA: Diagnosis not present

## 2018-10-08 DIAGNOSIS — I10 Essential (primary) hypertension: Secondary | ICD-10-CM | POA: Diagnosis not present

## 2018-12-20 ENCOUNTER — Other Ambulatory Visit: Payer: Self-pay

## 2018-12-20 ENCOUNTER — Encounter (HOSPITAL_COMMUNITY): Payer: Self-pay | Admitting: Emergency Medicine

## 2018-12-20 ENCOUNTER — Emergency Department (HOSPITAL_COMMUNITY): Payer: Medicare Other

## 2018-12-20 ENCOUNTER — Observation Stay (HOSPITAL_COMMUNITY)
Admission: EM | Admit: 2018-12-20 | Discharge: 2018-12-23 | Disposition: A | Discharge status: Home / Self Care | Payer: Medicare Other | Attending: Internal Medicine | Admitting: Internal Medicine

## 2018-12-20 DIAGNOSIS — N184 Chronic kidney disease, stage 4 (severe): Secondary | ICD-10-CM | POA: Diagnosis not present

## 2018-12-20 DIAGNOSIS — M1990 Unspecified osteoarthritis, unspecified site: Secondary | ICD-10-CM | POA: Insufficient documentation

## 2018-12-20 DIAGNOSIS — I472 Ventricular tachycardia: Secondary | ICD-10-CM | POA: Diagnosis not present

## 2018-12-20 DIAGNOSIS — M6281 Muscle weakness (generalized): Secondary | ICD-10-CM | POA: Insufficient documentation

## 2018-12-20 DIAGNOSIS — D631 Anemia in chronic kidney disease: Secondary | ICD-10-CM | POA: Diagnosis not present

## 2018-12-20 DIAGNOSIS — Z79899 Other long term (current) drug therapy: Secondary | ICD-10-CM | POA: Diagnosis not present

## 2018-12-20 DIAGNOSIS — F419 Anxiety disorder, unspecified: Secondary | ICD-10-CM | POA: Diagnosis not present

## 2018-12-20 DIAGNOSIS — R002 Palpitations: Secondary | ICD-10-CM | POA: Insufficient documentation

## 2018-12-20 DIAGNOSIS — I700 Atherosclerosis of aorta: Secondary | ICD-10-CM | POA: Insufficient documentation

## 2018-12-20 DIAGNOSIS — R001 Bradycardia, unspecified: Secondary | ICD-10-CM

## 2018-12-20 DIAGNOSIS — I441 Atrioventricular block, second degree: Secondary | ICD-10-CM | POA: Insufficient documentation

## 2018-12-20 DIAGNOSIS — I5032 Chronic diastolic (congestive) heart failure: Secondary | ICD-10-CM | POA: Insufficient documentation

## 2018-12-20 DIAGNOSIS — R2681 Unsteadiness on feet: Secondary | ICD-10-CM | POA: Insufficient documentation

## 2018-12-20 DIAGNOSIS — R0602 Shortness of breath: Secondary | ICD-10-CM

## 2018-12-20 DIAGNOSIS — Z87891 Personal history of nicotine dependence: Secondary | ICD-10-CM | POA: Diagnosis not present

## 2018-12-20 DIAGNOSIS — I5030 Unspecified diastolic (congestive) heart failure: Secondary | ICD-10-CM | POA: Diagnosis not present

## 2018-12-20 DIAGNOSIS — Z7982 Long term (current) use of aspirin: Secondary | ICD-10-CM | POA: Diagnosis not present

## 2018-12-20 DIAGNOSIS — I130 Hypertensive heart and chronic kidney disease with heart failure and stage 1 through stage 4 chronic kidney disease, or unspecified chronic kidney disease: Secondary | ICD-10-CM | POA: Diagnosis not present

## 2018-12-20 DIAGNOSIS — I4510 Unspecified right bundle-branch block: Secondary | ICD-10-CM | POA: Diagnosis not present

## 2018-12-20 DIAGNOSIS — R000 Tachycardia, unspecified: Secondary | ICD-10-CM

## 2018-12-20 DIAGNOSIS — I083 Combined rheumatic disorders of mitral, aortic and tricuspid valves: Secondary | ICD-10-CM | POA: Insufficient documentation

## 2018-12-20 DIAGNOSIS — I10 Essential (primary) hypertension: Secondary | ICD-10-CM | POA: Diagnosis present

## 2018-12-20 DIAGNOSIS — I4892 Unspecified atrial flutter: Secondary | ICD-10-CM | POA: Diagnosis not present

## 2018-12-20 DIAGNOSIS — N183 Chronic kidney disease, stage 3 (moderate): Secondary | ICD-10-CM | POA: Diagnosis not present

## 2018-12-20 LAB — CBC
HCT: 39.7 % (ref 36.0–46.0)
Hemoglobin: 12 g/dL (ref 12.0–15.0)
MCH: 26.3 pg (ref 26.0–34.0)
MCHC: 30.2 g/dL (ref 30.0–36.0)
MCV: 86.9 fL (ref 80.0–100.0)
PLATELETS: 317 10*3/uL (ref 150–400)
RBC: 4.57 MIL/uL (ref 3.87–5.11)
RDW: 16.5 % — ABNORMAL HIGH (ref 11.5–15.5)
WBC: 7.2 10*3/uL (ref 4.0–10.5)
nRBC: 0 % (ref 0.0–0.2)

## 2018-12-20 LAB — BASIC METABOLIC PANEL
Anion gap: 10 (ref 5–15)
BUN: 27 mg/dL — ABNORMAL HIGH (ref 8–23)
CO2: 24 mmol/L (ref 22–32)
Calcium: 9.6 mg/dL (ref 8.9–10.3)
Chloride: 104 mmol/L (ref 98–111)
Creatinine, Ser: 1.59 mg/dL — ABNORMAL HIGH (ref 0.44–1.00)
GFR calc Af Amer: 33 mL/min — ABNORMAL LOW (ref >60)
GFR calc non Af Amer: 28 mL/min — ABNORMAL LOW (ref >60)
Glucose, Bld: 96 mg/dL (ref 70–99)
Potassium: 4.2 mmol/L (ref 3.5–5.1)
Sodium: 138 mmol/L (ref 135–145)

## 2018-12-20 LAB — TROPONIN I
Troponin I: <0.03 ng/mL (ref <0.03)
Troponin I: <0.03 ng/mL (ref <0.03)

## 2018-12-20 MED ORDER — SODIUM CHLORIDE 0.9% FLUSH
3.0000 mL | Freq: Once | INTRAVENOUS | Status: AC
  Administered 2018-12-20: 3 mL via INTRAVENOUS

## 2018-12-20 MED ORDER — DILTIAZEM HCL 60 MG PO TABS
60.0000 mg | ORAL_TABLET | Freq: Once | ORAL | Status: AC
  Administered 2018-12-20: 60 mg via ORAL
  Filled 2018-12-20: qty 1

## 2018-12-20 MED ORDER — ACETAMINOPHEN 325 MG PO TABS: 650.0000 mg | ORAL_TABLET | Freq: Four times a day (QID) | ORAL | Status: DC | PRN

## 2018-12-20 MED ORDER — ACETAMINOPHEN 650 MG RE SUPP: 650.0000 mg | Freq: Four times a day (QID) | RECTAL | Status: DC | PRN

## 2018-12-20 MED ORDER — METOPROLOL TARTRATE 25 MG PO TABS
25.0000 mg | ORAL_TABLET | Freq: Once | ORAL | Status: AC
  Administered 2018-12-20: 25 mg via ORAL
  Filled 2018-12-20: qty 1

## 2018-12-20 NOTE — Consult Note (Signed)
Reason for Consult: Atrial flutter with slow ventricular response Referring Physician: [facility] hospitalist  Shannon Cruz is an 85 y.o. female.  HPI: Patient is 85 year old female with past medical history significant for hypertension, history of paroxysmal A. fib flutter in the past , not on any anticoagulation, chads vas score of 4 history of episodic palpitations, history of diastolic dysfunction, right bundle branch block, degenerative joint disease, unsteady gait, came to ER complaining of episode of palpitation associated with shortness of breath and bilateral shoulder pain and arm pain.  Patient denies any chest pain nausea vomiting diaphoresis.  States feels anxious and stressed as her sister recently passed away.  States she was told to have irregular heartbeat in the past but has never been on any anticoagulation.  Denies any bleeding episodes in the past but have unsteady gait and walks with a walker.  Past Medical History:  Diagnosis Date  . Arthritis   . H/O diastolic dysfunction   . Heart palpitations   . Hypertension   . RBBB (right bundle branch block) 11/20/2010   Echo - EF >55%; flow pattern suggestive of impaired LV relaxation, proximal septal thickening noted; mitral valve mildly thickened; mild mitral annular calcification; aortic root sclerosis/calcification    History reviewed. No pertinent surgical history.  No family history on file.  Social History:  reports that she quit smoking about 36 years ago. She has never used smokeless tobacco. She reports that she does not drink alcohol or use drugs.  Allergies: No Known Allergies  Medications: I have reviewed the patient's current medications.  Results for orders placed or performed during the hospital encounter of 12/20/18 (from the past 48 hour(s))  Troponin I - ONCE - STAT     Status: None   Collection Time: 12/20/18 12:08 PM  Result Value Ref Range   Troponin I <0.03 <0.03 ng/mL    Comment: Performed at [facility], [place] [address], [place], [place] [address]  Basic metabolic panel     Status: Abnormal   Collection Time: 12/20/18 12:30 PM  Result Value Ref Range   Sodium 138 135 - 145 mmol/L   Potassium 4.2 3.5 - 5.1 mmol/L   Chloride 104 98 - 111 mmol/L   CO2 24 22 - 32 mmol/L   Glucose, Bld 96 70 - 99 mg/dL   BUN 27 (H) 8 - 23 mg/dL   Creatinine, Ser 1.59 (H) 0.44 - 1.00 mg/dL   Calcium 9.6 8.9 - 10.3 mg/dL   GFR calc non Af Amer 28 (L) >60 mL/min   GFR calc Af Amer 33 (L) >60 mL/min   Anion gap 10 5 - 15    Comment: Performed at [facility] [address], [place], [place] [address]  CBC     Status: Abnormal   Collection Time: 12/20/18 12:30 PM  Result Value Ref Range   WBC 7.2 4.0 - 10.5 K/uL   RBC 4.57 3.87 - 5.11 MIL/uL   Hemoglobin 12.0 12.0 - 15.0 g/dL   HCT 39.7 36.0 - 46.0 %   MCV 86.9 80.0 - 100.0 fL   MCH 26.3 26.0 - 34.0 pg   MCHC 30.2 30.0 - 36.0 g/dL   RDW 16.5 (H) 11.5 - 15.5 %   Platelets 317 150 - 400 K/uL   nRBC 0.0 0.0 - 0.2 %    Comment: Performed at [facility], [place] [address] [place], [place] [address]  Troponin I - Once-Timed     Status:  None   Collection Time: 12/20/18  2:15 PM  Result Value Ref Range   Troponin I <0.03 <0.03 ng/mL    Comment: Performed at [facility], [place] [address] [place], [place] [address]    Dg Chest 2 View  Result Date: 12/20/2018 CLINICAL DATA:  Pt c/o tachycardia, weakness and SOB. Pt states she has dyspnea at rest and on exertion and noticed she was weak and shaky today. RBBB (right bundle branch block); H/O diastolic dysfunction; Heart palpitations. EXAM: CHEST - 2 VIEW COMPARISON:  11/29/2017 FINDINGS: Cardiac silhouette is mildly enlarged. No mediastinal or hilar masses. There is no evidence of adenopathy. Clear lungs.  No pleural effusion or pneumothorax. Skeletal structures are demineralized but intact. IMPRESSION: No active cardiopulmonary  disease. Electronically Signed   By: Lajean Manes M.D.   On: 12/20/2018 12:47    Review of Systems  Constitutional: Positive for malaise/fatigue. Negative for chills and fever.  HENT: Negative for hearing loss.   Eyes: Negative for blurred vision.  Respiratory: Positive for shortness of breath. Negative for cough.   Cardiovascular: Positive for palpitations. Negative for chest pain.  Gastrointestinal: Negative for nausea and vomiting.  Genitourinary: Negative for dysuria.  Skin: Negative for rash.  Neurological: Negative for dizziness.   Blood pressure 125/73, pulse 69, temperature 98.3 F (36.8 C), temperature source Rectal, resp. rate 16, height 5\' 6"  (1.676 m), weight 49.9 kg, SpO2 100 %. Physical Exam  Constitutional: She is oriented to person, place, and time.  HENT:  Head: Normocephalic and atraumatic.  Eyes: Pupils are equal, round, and reactive to light. Conjunctivae are normal. Left eye exhibits no discharge. No scleral icterus.  Neck: Neck supple. No JVD present. No tracheal deviation present. No thyromegaly present.  Cardiovascular:  Irregularly regular S1-S2 soft there is soft systolic murmur no pericardial rub  Respiratory: Effort normal and breath sounds normal. No respiratory distress. She has no wheezes. She has no rales.  GI: Soft. Bowel sounds are normal. She exhibits no distension. There is no abdominal tenderness. There is no rebound and no guarding.  Musculoskeletal:        General: No tenderness, deformity or edema.  Neurological: She is alert and oriented to person, place, and time.    Assessment/Plan: New onset atrial flutter with controlled ventricular response now History of A. fib with RVR in the past chads vas score of 4 not on anticoagulation due to her age/unsteady gait/high risk for fall and pericardial effusion in the past Probably tachybradycardia syndrome Hypertension Compensated diastolic congestive heart failure Degenerative joint  disease Chronic kidney disease stage III Plan Agree with telemetry monitoring for next 24 to 48 hours Check serial enzymes/TSH Check 2D echo check LV systolic/diastolic function check for pericardial effusion Will hold off to anticoagulation for now in view of her age unsteady gait and prior pericardial effusion, will discuss further with family once 2D echo result is available Check old records from Dr. Merrilee Jansky office Shannon Cruz 12/20/2018, 5:26 PM

## 2018-12-20 NOTE — ED Notes (Signed)
Bed: WA15 Expected date:  Expected time:  Means of arrival:  Comments: Hold for triage 3 

## 2018-12-20 NOTE — ED Notes (Signed)
ED TO INPATIENT HANDOFF REPORT  Name/Age/Gender [name] 85 y.o. female  Code Status Code Status History    Date Active Date Inactive Code Status Order ID Comments User Context   06/24/2017 1837 06/26/2017 2157 Partial Code [id number]  Dixie Dials, MD Inpatient    Questions for Most Recent Historical Code Status (Order [id number])    Question Answer Comment   In the event of cardiac or respiratory ARREST: Initiate Code Blue, Call Rapid Response Yes    In the event of cardiac or respiratory ARREST: Perform CPR No    In the event of cardiac or respiratory ARREST: Perform Intubation/Mechanical Ventilation No    In the event of cardiac or respiratory ARREST: Use NIPPV/BiPAp only if indicated Yes    In the event of cardiac or respiratory ARREST: Administer ACLS medications if indicated Yes    In the event of cardiac or respiratory ARREST: Perform Defibrillation or Cardioversion if indicated Yes       Home/SNF/Other Home  Chief Complaint heart beating fast  Level of Care/Admitting Diagnosis ED Disposition    ED Disposition Condition [place]: [place] [100102]  Level of Care: Telemetry [5]  Admit to tele based on following criteria: Complex arrhythmia (Bradycardia/Tachycardia)  Diagnosis: Atrial flutter ([place]) [427.32.ICD-9-CM]  Admitting Physician: Elodia Florence [phone]  Attending Physician: Cephus Slater, A CALDWELL [phone]  PT Class (Do Not Modify): Observation [104]  PT Acc Code (Do Not Modify): Observation [10022]       Medical History Past Medical History:  Diagnosis Date  . Arthritis   . H/O diastolic dysfunction   . Heart palpitations   . Hypertension   . RBBB (right bundle branch block) 11/20/2010   Echo - EF >55%; flow pattern suggestive of impaired LV relaxation, proximal septal thickening noted; mitral valve mildly thickened; mild mitral annular calcification; aortic root sclerosis/calcification     Allergies No Known Allergies  IV Location/Drains/Wounds Patient Lines/Drains/Airways Status   Active Line/Drains/Airways    Name:   Placement date:   Placement time:   Site:   Days:   Peripheral IV 12/20/18 Left Antecubital   12/20/18    1230    Antecubital   less than 1          Labs/Imaging Results for orders placed or performed during the hospital encounter of 12/20/18 (from the past 48 hour(s))  Troponin I - ONCE - STAT     Status: None   Collection Time: 12/20/18 12:08 PM  Result Value Ref Range   Troponin I <0.03 <0.03 ng/mL    Comment: Performed at [facility], [place] [address] [place], [place] [address]  Basic metabolic panel     Status: Abnormal   Collection Time: 12/20/18 12:30 PM  Result Value Ref Range   Sodium 138 135 - 145 mmol/L   Potassium 4.2 3.5 - 5.1 mmol/L   Chloride 104 98 - 111 mmol/L   CO2 24 22 - 32 mmol/L   Glucose, Bld 96 70 - 99 mg/dL   BUN 27 (H) 8 - 23 mg/dL   Creatinine, Ser 1.59 (H) 0.44 - 1.00 mg/dL   Calcium 9.6 8.9 - 10.3 mg/dL   GFR calc non Af Amer 28 (L) >60 mL/min   GFR calc Af Amer 33 (L) >60 mL/min   Anion gap 10 5 - 15    Comment: Performed at [facility] [address] [place], [place] [address]  CBC  Status: Abnormal   Collection Time: 12/20/18 12:30 PM  Result Value Ref Range   WBC 7.2 4.0 - 10.5 K/uL   RBC 4.57 3.87 - 5.11 MIL/uL   Hemoglobin 12.0 12.0 - 15.0 g/dL   HCT 39.7 36.0 - 46.0 %   MCV 86.9 80.0 - 100.0 fL   MCH 26.3 26.0 - 34.0 pg   MCHC 30.2 30.0 - 36.0 g/dL   RDW 16.5 (H) 11.5 - 15.5 %   Platelets 317 150 - 400 K/uL   nRBC 0.0 0.0 - 0.2 %    Comment: Performed at [facility], [place] [address], [place], [place] [address]  Troponin I - Once-Timed     Status: None   Collection Time: 12/20/18  2:15 PM  Result Value Ref Range   Troponin I <0.03 <0.03 ng/mL    Comment: Performed at [facility], [place] [address] [place],  [place] [address]   Dg Chest 2 View  Result Date: 12/20/2018 CLINICAL DATA:  Pt c/o tachycardia, weakness and SOB. Pt states she has dyspnea at rest and on exertion and noticed she was weak and shaky today. RBBB (right bundle branch block); H/O diastolic dysfunction; Heart palpitations. EXAM: CHEST - 2 VIEW COMPARISON:  11/29/2017 FINDINGS: Cardiac silhouette is mildly enlarged. No mediastinal or hilar masses. There is no evidence of adenopathy. Clear lungs.  No pleural effusion or pneumothorax. Skeletal structures are demineralized but intact. IMPRESSION: No active cardiopulmonary disease. Electronically Signed   By: Lajean Manes M.D.   On: 12/20/2018 12:47    Pending Labs [employer] (From admission, onward)    Start     Ordered   Signed and Held  TSH  Add-on,   R     Signed and Held   Signed and Held  Comprehensive metabolic panel  Tomorrow morning,   R     Signed and Held   Signed and Held  CBC  Tomorrow morning,   R     Signed and Held          Vitals/Pain Today's Vitals   12/20/18 1300 12/20/18 1400 12/20/18 1500 12/20/18 1600  BP: 124/80 109/63 (!) 107/52 125/73  Pulse: (!) 107 71 (!) 29 69  Resp: 16 13 12 16   Temp:      TempSrc:      SpO2: 100% 100% 100% 100%  Weight:      Height:      PainSc:        Isolation Precautions No active isolations  Medications Medications  sodium chloride flush (NS) 0.9 % injection 3 mL (3 mLs Intravenous Given 12/20/18 1230)  diltiazem (CARDIZEM) tablet 60 mg (60 mg Oral Given 12/20/18 1241)  metoprolol tartrate (LOPRESSOR) tablet 25 mg (25 mg Oral Given 12/20/18 1241)    Mobility walks

## 2018-12-20 NOTE — ED Notes (Signed)
Call report to [facility] @ [phone] at [phone]

## 2018-12-20 NOTE — ED Triage Notes (Signed)
Pt c/o tachycardia, weakness and shortness of breath. Pt states she has dyspnea at rest and on exertion and noticed she was weak and shaky today.

## 2018-12-20 NOTE — H&P (Signed)
History and Physical    Shannon Cruz UDJ:[id number] DOB: 1924-11-20 DOA: 12/20/2018  PCP: Shannon Dials, MD  Patient coming from: home  I have personally briefly reviewed patient's old medical records in [place]  Chief Complaint: palpitations  HPI: Shannon Cruz is Shannon Cruz 85 y.o. female with medical history significant of hypertension, palpitations presenting with shortness of breath and palpitations.   Received unfortunate news today and after that got upset.  Her brother had passed away and she was unable to go to his funeral.  She feels stressed because of this.  Her son was present and saw her stop, she complained of some shortness of breath and not feeling well.  This was around 11 AM.  She notes that she was feeling her heart racing and palpitations.  She had not taken any of her blood pressure medicines yet today.  She denies any fevers, chest pain, chills, numbness, tingling, weakness.  Her shortness of breath is now resolved.  Palpitations have improved as well.    ED Course: labs, ekg, home BP meds (metop and dilt).  Then developed bradycardia.  Discussed with cards who noted flutter with slow response, recommended observation.  Review of Systems: As per HPI otherwise 10 point review of systems negative.   Past Medical History:  Diagnosis Date  . Arthritis   . H/O diastolic dysfunction   . Heart palpitations   . Hypertension   . RBBB (right bundle branch block) 11/20/2010   Echo - EF >55%; flow pattern suggestive of impaired LV relaxation, proximal septal thickening noted; mitral valve mildly thickened; mild mitral annular calcification; aortic root sclerosis/calcification    History reviewed. No pertinent surgical history.   reports that she quit smoking about 36 years ago. She has never used smokeless tobacco. She reports that she does not drink alcohol or use drugs.  No Known Allergies  No family history on file.  Prior to Admission medications     Medication Sig Start Date End Date Taking? Authorizing Provider  acetaminophen (TYLENOL) 500 MG tablet Take 1,000 mg by mouth every 6 (six) hours as needed for mild pain.    Yes [provider]  aspirin 81 MG chewable tablet Chew 81 mg by mouth daily.   Yes [provider]  furosemide (LASIX) 20 MG tablet Take 1 tablet (20 mg total) by mouth daily. 06/27/17  Yes Shannon Dials, MD  metoprolol tartrate (LOPRESSOR) 25 MG tablet Take 1 tablet (25 mg total) by mouth 2 (two) times daily. 06/26/17  Yes Shannon Dials, MD  nitroGLYCERIN (NITROSTAT) 0.4 MG SL tablet Place 0.4 mg under the tongue every 5 (five) minutes as needed for chest pain.   Yes [provider]  potassium chloride (K-DUR,KLOR-CON) 10 MEQ tablet Take 1 tablet (10 mEq total) by mouth daily. 06/26/17  Yes Shannon Dials, MD  diltiazem (CARDIZEM) 60 MG tablet Take 1 tablet (60 mg total) by mouth 2 (two) times daily. Patient not taking: Reported on 12/20/2018 06/26/17   Shannon Dials, MD  phenazopyridine (PYRIDIUM) 200 MG tablet Take 1 tablet (200 mg total) by mouth 3 (three) times daily. Patient not taking: Reported on 12/20/2018 06/22/18   Carmin Muskrat, MD  vitamin C (VITAMIN C) 250 MG tablet Take 1 tablet (250 mg total) by mouth daily with breakfast. Patient not taking: Reported on 09/26/2017 06/27/17   Shannon Dials, MD    Physical Exam: Vitals:   12/20/18 1500 12/20/18 1600 12/20/18 1800 12/20/18 1823  BP: (!) 107/52 125/73 (!) 153/84 Marland Kitchen)  154/81  Pulse: (!) 29 69 73 72  Resp: 12 16 16 20   Temp:    98 F (36.7 C)  TempSrc:    Oral  SpO2: 100% 100% 100% 100%  Weight:   55.4 kg   Height:   5\' 7"  (1.702 m)     Constitutional: NAD, calm, comfortable. Cachetic. Frail.  Vitals:   12/20/18 1500 12/20/18 1600 12/20/18 1800 12/20/18 1823  BP: (!) 107/52 125/73 (!) 153/84 (!) 154/81  Pulse: (!) 29 69 73 72  Resp: 12 16 16 20   Temp:    98 F (36.7 C)  TempSrc:    Oral  SpO2: 100% 100% 100% 100%  Weight:    55.4 kg   Height:   5\' 7"  (1.702 m)    Eyes: PERRL, lids and conjunctivae normal ENMT: Mucous membranes are moist. Posterior pharynx clear of any exudate or lesions.Normal dentition.  Neck: normal, supple, no masses, no thyromegaly Respiratory: clear to auscultation bilaterally, no wheezing, no crackles.  Cardiovascular: irregular rate and rhythm, no murmurs / rubs / gallops. No extremity edema.  Abdomen: no tenderness, no masses palpated. No hepatosplenomegaly. Bowel sounds positive.  Musculoskeletal: no clubbing / cyanosis. No joint deformity upper and lower extremities. Good ROM, no contractures. Normal muscle tone.  Skin: no rashes, lesions, ulcers. No induration Neurologic: CN 2-12 grossly intact. Sensation intact. Strength 5/5 in all 4.  Psychiatric: Normal judgment and insight. Alert and oriented x 3. Normal mood.   Labs on Admission: I have personally reviewed following labs and imaging studies  CBC: Recent Labs  Lab 12/20/18 1230  WBC 7.2  HGB 12.0  HCT 39.7  MCV 86.9  PLT 500   Basic Metabolic Panel: Recent Labs  Lab 12/20/18 1230  NA 138  K 4.2  CL 104  CO2 24  GLUCOSE 96  BUN 27*  CREATININE 1.59*  CALCIUM 9.6   GFR: Estimated Creatinine Clearance: 20.2 mL/min ([name]) (by C-G formula based on SCr of 1.59 mg/dL (H)). Liver Function Tests: No results for input(s): AST, ALT, ALKPHOS, BILITOT, PROT, ALBUMIN in the last 168 hours. No results for input(s): LIPASE, AMYLASE in the last 168 hours. No results for input(s): AMMONIA in the last 168 hours. Coagulation Profile: No results for input(s): INR, PROTIME in the last 168 hours. Cardiac Enzymes: Recent Labs  Lab 12/20/18 1208 12/20/18 1415  TROPONINI <0.03 <0.03   BNP (last 3 results) No results for input(s): PROBNP in the last 8760 hours. HbA1C: No results for input(s): HGBA1C in the last 72 hours. CBG: No results for input(s): GLUCAP in the last 168 hours. Lipid Profile: No results for input(s): CHOL,  HDL, LDLCALC, TRIG, CHOLHDL, LDLDIRECT in the last 72 hours. Thyroid Function Tests: No results for input(s): TSH, T4TOTAL, FREET4, T3FREE, THYROIDAB in the last 72 hours. Anemia Panel: No results for input(s): VITAMINB12, FOLATE, FERRITIN, TIBC, IRON, RETICCTPCT in the last 72 hours. Urine analysis:    Component Value Date/Time   COLORURINE YELLOW 06/22/2018 1722   APPEARANCEUR HAZY (Minha Fulco) 06/22/2018 1722   LABSPEC 1.012 06/22/2018 1722   PHURINE 6.0 06/22/2018 1722   GLUCOSEU NEGATIVE 06/22/2018 1722   HGBUR MODERATE (Amely Voorheis) 06/22/2018 1722   BILIRUBINUR NEGATIVE 06/22/2018 1722   KETONESUR NEGATIVE 06/22/2018 1722   PROTEINUR NEGATIVE 06/22/2018 1722   UROBILINOGEN 0.2 01/24/2015 0736   NITRITE NEGATIVE 06/22/2018 1722   LEUKOCYTESUR LARGE (Alize Acy) 06/22/2018 1722    Radiological Exams on Admission: Dg Chest 2 View  Result Date: 12/20/2018 CLINICAL DATA:  Pt  c/o tachycardia, weakness and SOB. Pt states she has dyspnea at rest and on exertion and noticed she was weak and shaky today. RBBB (right bundle branch block); H/O diastolic dysfunction; Heart palpitations. EXAM: CHEST - 2 VIEW COMPARISON:  11/29/2017 FINDINGS: Cardiac silhouette is mildly enlarged. No mediastinal or hilar masses. There is no evidence of adenopathy. Clear lungs.  No pleural effusion or pneumothorax. Skeletal structures are demineralized but intact. IMPRESSION: No active cardiopulmonary disease. Electronically Signed   By: Lajean Manes M.D.   On: 12/20/2018 12:47    EKG: Independently reviewed. Atrial flutter.   Assessment/Plan Active Problems:   Atrial flutter (HCC)  Atrial flutter  Tachycardia  Bradycardia: Presented with palpitations, tachycardia.  Developed bradycardia after receiving dilt with flutter with slow ventricular response.  new onset.  Initially was tachycardic, then received home dilt and metop and became bradycardic with HR in 30's-40's per EDP.  Cased discussed with cards on call, suspected slow  flutter and recommended observation.   Follow TSH, echo. Currently rate controlled, hold on further AV nodal blockers Chadsvasc is at least 4 Continue tele Appreciate Dr. Terrence Dupont assistance (concern possibly for tachybrady) Holding anticoagulation for now Troponins  Anxiety: suspect some of her sx related to anxiety with funeral today that she missed.   Hypertension: holding home meds for now as noted above  CKD IV: renal function at baseline  Pt's son is not aware of meds and pt is unable to tell me as well.  Follow up updated med rec with pharmacy when able.  DVT prophylaxis: SCD  Code Status: full  Family Communication: son at bedside  Disposition Plan: pending improvement Consults called: cardiology  Admission status: obs    Fayrene Helper MD [facility] Pager AMION  If 7PM-7AM, please contact night-coverage [email or web address] Password [facility]  12/20/2018, 8:28 PM

## 2018-12-20 NOTE — ED Provider Notes (Signed)
[facility] Emergency Department Provider Note MRN:  [id number]  Arrival date & time: 12/20/18     Chief Complaint   Tachycardia and Shortness of Breath   History of Present Illness   Shannon Cruz is a 85 y.o. year-old female with a history of hypertension presenting to the ED with chief complaint of tachycardia and shortness of breath.  Patient explains that her sister recently passed away, they were making arrangements earlier this morning when she suddenly felt like her heart was racing, her family members noticed that she seemed short of breath.  Patient also endorsing intermittent pain to bilateral shoulders, denies chest pain.  Denies abdominal pain, no recent fever or cold-like symptoms, no numbness weakness to the arms or legs.  Review of Systems  A complete 10 system review of systems was obtained and all systems are negative except as noted in the HPI and PMH.   Patient's Health History    Past Medical History:  Diagnosis Date  . Arthritis   . H/O diastolic dysfunction   . Heart palpitations   . Hypertension   . RBBB (right bundle branch block) 11/20/2010   Echo - EF >55%; flow pattern suggestive of impaired LV relaxation, proximal septal thickening noted; mitral valve mildly thickened; mild mitral annular calcification; aortic root sclerosis/calcification    History reviewed. No pertinent surgical history.  No family history on file.  Social History   Socioeconomic History  . Marital status: Widowed    Spouse name: Not on file  . Number of children: Not on file  . Years of education: Not on file  . Highest education level: Not on file  Occupational History  . Not on file  Social Needs  . Financial resource strain: Not on file  . Food insecurity:    Worry: Not on file    Inability: Not on file  . Transportation needs:    Medical: Not on file    Non-medical: Not on file  Tobacco Use  . Smoking status: Former Smoker    Last attempt to  quit: 11/05/1982    Years since quitting: 36.1  . Smokeless tobacco: Never Used  Substance and Sexual Activity  . Alcohol use: No  . Drug use: No  . Sexual activity: Never  Lifestyle  . Physical activity:    Days per week: Not on file    Minutes per session: Not on file  . Stress: Not on file  Relationships  . Social connections:    Talks on phone: Not on file    Gets together: Not on file    Attends religious service: Not on file    Active member of club or organization: Not on file    Attends meetings of clubs or organizations: Not on file    Relationship status: Not on file  . Intimate partner violence:    Fear of current or ex partner: Not on file    Emotionally abused: Not on file    Physically abused: Not on file    Forced sexual activity: Not on file  Other Topics Concern  . Not on file  Social History Narrative  . Not on file     Physical Exam  Vital Signs and Nursing Notes reviewed Vitals:   12/20/18 1500 12/20/18 1600  BP: (!) 107/52 125/73  Pulse: (!) 29 69  Resp: 12 16  Temp:    SpO2: 100% 100%    CONSTITUTIONAL: Well-appearing, NAD NEURO:  Alert and oriented x 3, no  focal deficits EYES:  eyes equal and reactive ENT/NECK:  no LAD, no JVD CARDIO: Tachycardic rate, well-perfused, normal S1 and S2 PULM:  CTAB no wheezing or rhonchi GI/GU:  normal bowel sounds, non-distended, non-tender MSK/SPINE:  No gross deformities, no edema SKIN:  no rash, atraumatic PSYCH:  Appropriate speech and behavior  Diagnostic and Interventional Summary    EKG Interpretation  Date/Time:  Saturday December 20 2018 11:56:35 EST Ventricular Rate:  111 PR Interval:    QRS Duration: 156 QT Interval:  380 QTC Calculation: 517 R Axis:   -80 Text Interpretation:  Sinus tachycardia Right bundle branch block Confirmed by Gerlene Fee [phone]) on 12/20/2018 12:02:25 PM       EKG Interpretation  Date/Time:  Saturday December 20 2018 14:16:18 EST Ventricular Rate:  57 PR  Interval:    QRS Duration: 156 QT Interval:  496 QTC Calculation: 444 R Axis:   -79 Text Interpretation:  Sinus rhythm Second deg AVB, Mobitz I (Wenckebach) Right bundle branch block Inferior infarct, age indeterminate Confirmed by Gerlene Fee [id number]) on 12/20/2018 3:33:04 PM       Labs Reviewed  BASIC METABOLIC PANEL - Abnormal; Notable for the following components:      Result Value   BUN 27 (*)    Creatinine, Ser 1.59 (*)    GFR calc non Af Amer 28 (*)    GFR calc Af Amer 33 (*)    All other components within normal limits  CBC - Abnormal; Notable for the following components:   RDW 16.5 (*)    All other components within normal limits  TROPONIN I  TROPONIN I    DG Chest 2 View  Final Result      Medications  sodium chloride flush (NS) 0.9 % injection 3 mL (3 mLs Intravenous Given 12/20/18 1230)  diltiazem (CARDIZEM) tablet 60 mg (60 mg Oral Given 12/20/18 1241)  metoprolol tartrate (LOPRESSOR) tablet 25 mg (25 mg Oral Given 12/20/18 1241)     Procedures Critical Care  ED Course and Medical Decision Making  I have reviewed the triage vital signs and the nursing notes.  Pertinent labs & imaging results that were available during my care of the patient were reviewed by me and considered in my medical decision making (see below for details).  Most consistent with anxiety related symptoms, but will considering ACS, pneumothorax, less likely PE.  Patient is mildly tachycardic but explains she did not take her medications this morning, will provide single dose of 60 mg diltiazem, 25 mg of Toprol and reassess heart rate.  Patient is well-appearing and currently asymptomatic.  Work-up pending.  Patient continues to look and feel well, troponin negative x2.  Patient noted to be now bradycardic with heart rates in the 30s to 40s.  Repeat EKG reveals multiple P waves with few QRS complexes, thought to be either slow flutter versus complete heart block.  Discussed with cardiologist  on-call, who is favoring slow flutter, but recommends admission to observe and evaluate patient's current medication regimen.  Accepted by hospitalist service for further care.  Barth Kirks. Sedonia Small, MD [place] [email or web address]  Final Clinical Impressions(s) / ED Diagnoses     ICD-10-CM   1. SOB (shortness of breath) R06.02   2. Tachycardia R00.0   3. Bradycardia R00.1     ED Discharge Orders    None         Maudie Flakes, MD 12/20/18 1649

## 2018-12-20 NOTE — ED Notes (Signed)
Patient transported to X-ray 

## 2018-12-21 ENCOUNTER — Observation Stay (HOSPITAL_COMMUNITY): Payer: Medicare Other

## 2018-12-21 DIAGNOSIS — I5030 Unspecified diastolic (congestive) heart failure: Secondary | ICD-10-CM | POA: Diagnosis not present

## 2018-12-21 DIAGNOSIS — I4892 Unspecified atrial flutter: Secondary | ICD-10-CM | POA: Diagnosis not present

## 2018-12-21 DIAGNOSIS — I10 Essential (primary) hypertension: Secondary | ICD-10-CM

## 2018-12-21 DIAGNOSIS — R2681 Unsteadiness on feet: Secondary | ICD-10-CM | POA: Diagnosis not present

## 2018-12-21 DIAGNOSIS — N183 Chronic kidney disease, stage 3 (moderate): Secondary | ICD-10-CM | POA: Diagnosis not present

## 2018-12-21 DIAGNOSIS — R001 Bradycardia, unspecified: Secondary | ICD-10-CM | POA: Diagnosis not present

## 2018-12-21 LAB — CBC
HCT: 37 % (ref 36.0–46.0)
Hemoglobin: 11.2 g/dL — ABNORMAL LOW (ref 12.0–15.0)
MCH: 26.1 pg (ref 26.0–34.0)
MCHC: 30.3 g/dL (ref 30.0–36.0)
MCV: 86.2 fL (ref 80.0–100.0)
Platelets: 316 10*3/uL (ref 150–400)
RBC: 4.29 MIL/uL (ref 3.87–5.11)
RDW: 16.6 % — ABNORMAL HIGH (ref 11.5–15.5)
WBC: 5.7 10*3/uL (ref 4.0–10.5)
nRBC: 0 % (ref 0.0–0.2)

## 2018-12-21 LAB — COMPREHENSIVE METABOLIC PANEL
ALT: 10 U/L (ref 0–44)
AST: 22 U/L (ref 15–41)
Albumin: 3.4 g/dL — ABNORMAL LOW (ref 3.5–5.0)
Alkaline Phosphatase: 89 U/L (ref 38–126)
Anion gap: 8 (ref 5–15)
BUN: 30 mg/dL — ABNORMAL HIGH (ref 8–23)
CALCIUM: 9.2 mg/dL (ref 8.9–10.3)
CO2: 25 mmol/L (ref 22–32)
Chloride: 105 mmol/L (ref 98–111)
Creatinine, Ser: 1.61 mg/dL — ABNORMAL HIGH (ref 0.44–1.00)
GFR calc non Af Amer: 28 mL/min — ABNORMAL LOW (ref >60)
GFR, EST AFRICAN AMERICAN: 32 mL/min — AB (ref >60)
Glucose, Bld: 107 mg/dL — ABNORMAL HIGH (ref 70–99)
Potassium: 3.9 mmol/L (ref 3.5–5.1)
Sodium: 138 mmol/L (ref 135–145)
TOTAL PROTEIN: 6.6 g/dL (ref 6.5–8.1)
Total Bilirubin: 0.6 mg/dL (ref 0.3–1.2)

## 2018-12-21 LAB — TSH: TSH: 4.241 u[IU]/mL (ref 0.350–4.500)

## 2018-12-21 LAB — TROPONIN I
Troponin I: <0.03 ng/mL (ref <0.03)
Troponin I: <0.03 ng/mL (ref <0.03)

## 2018-12-21 MED ORDER — METOPROLOL TARTRATE 25 MG PO TABS
12.5000 mg | ORAL_TABLET | Freq: Two times a day (BID) | ORAL | Status: DC
  Filled 2018-12-21: qty 1

## 2018-12-21 MED ORDER — METOPROLOL TARTRATE 25 MG PO TABS
25.0000 mg | ORAL_TABLET | Freq: Two times a day (BID) | ORAL | Status: DC
  Administered 2018-12-21 – 2018-12-23 (×5): 25 mg via ORAL
  Filled 2018-12-21 (×4): qty 1

## 2018-12-21 NOTE — Progress Notes (Signed)
RN saw note from telemetry that patient had 7 beats of VTach this morning.  VS obtained.  Heart rate in 110s.  Patient denies pain, dizziness, shortness of breath.  Dr. Karleen Hampshire notified via text page.

## 2018-12-21 NOTE — CV Procedure (Signed)
Echocardiogram not completed the patient wanted to finish her breakfast. Echo will be retried tomorrow.  Darlina Sicilian RD CS

## 2018-12-21 NOTE — Progress Notes (Signed)
PROGRESS NOTE    Shannon Cruz  HWE:[id number] DOB: 06/06/1925 DOA: 12/20/2018 PCP: Dixie Dials, MD   Brief Narrative:  Shannon Cruz is a 85 y.o. female with medical history significant of hypertension, palpitations presenting with shortness of breath and palpitations.    Assessment & Plan:   Principal Problem:   Atrial flutter ([place]) Active Problems:   HTN (hypertension)  New onset atrial flutter with rvr on admission:  With some non sustained VT.  Better rate controlled today with BB.  Appreciate cardiology recommendations.  Echocardiogram ordered.  Not on anti coagulation due to unsteady gait/ falls and age.  [place]ali VAS score of 4.  TSH wnl.     Diastolic heart failure: chronic and she appears compensated.    Stage 3 CKD:  Creatinine at baseline.    Mild anemia of chronic disease Hemoglobin between 11 to 12.    Hypertension:  Controlled.       DVT prophylaxis: scd's Code Status: full code.  Family Communication: none at bedside.  Disposition Plan: Pending clinical improvement, and rate control and further work up by cardiology, PT evaluation.   Consultants:  Cardiology  Procedures:  Echocardiogram.   Antimicrobials:None.   Subjective: No chest pain or palpitations.  No nausea, or vomiting.   Objective: Vitals:   12/21/18 0538 12/21/18 0823 12/21/18 1011 12/21/18 1418  BP: (!) 141/83 (!) 139/95 139/80 102/72  Pulse: (!) 110 (!) 104 (!) 106 91  Resp: 20 18  18   Temp: 98.4 F (36.9 C)   98.3 F (36.8 C)  TempSrc: Oral   Oral  SpO2: 99% 100%  100%  Weight:      Height:        Intake/Output Summary (Last 24 hours) at 12/21/2018 1554 Last data filed at 12/21/2018 0942 Gross per 24 hour  Intake 120 ml  Output -  Net 120 ml   Filed Weights   12/20/18 1144 12/20/18 1800  Weight: 49.9 kg 55.4 kg    Examination:  General exam: Appears calm and comfortable  Respiratory system: Clear to auscultation. Respiratory effort  normal. Cardiovascular system: S1 & S2 heard,irregular, tachycardic, no pedal edema. .  Gastrointestinal system: Abdomen is nondistended, soft and nontender.  Normal bowel sounds heard. Central nervous system: Alert and oriented. No focal neurological deficits. Extremities: Symmetric 5 x 5 power. Skin: No rashes, lesions or ulcers Psychiatry: Mood & affect appropriate.     Data Reviewed: I have personally reviewed following labs and imaging studies  CBC: Recent Labs  Lab 12/20/18 1230 12/21/18 0252  WBC 7.2 5.7  HGB 12.0 11.2*  HCT 39.7 37.0  MCV 86.9 86.2  PLT 317 893   Basic Metabolic Panel: Recent Labs  Lab 12/20/18 1230 12/21/18 0252  NA 138 138  K 4.2 3.9  CL 104 105  CO2 24 25  GLUCOSE 96 107*  BUN 27* 30*  CREATININE 1.59* 1.61*  CALCIUM 9.6 9.2   GFR: Estimated Creatinine Clearance: 19.9 mL/min (A) (by C-G formula based on SCr of 1.61 mg/dL (H)). Liver Function Tests: Recent Labs  Lab 12/21/18 0252  AST 22  ALT 10  ALKPHOS 89  BILITOT 0.6  PROT 6.6  ALBUMIN 3.4*   No results for input(s): LIPASE, AMYLASE in the last 168 hours. No results for input(s): AMMONIA in the last 168 hours. Coagulation Profile: No results for input(s): INR, PROTIME in the last 168 hours. Cardiac Enzymes: Recent Labs  Lab 12/20/18 1208 12/20/18 1415 12/20/18 2053 12/21/18 0252 12/21/18 0848  TROPONINI <  0.03 <0.03 <0.03 <0.03 <0.03   BNP (last 3 results) No results for input(s): PROBNP in the last 8760 hours. HbA1C: No results for input(s): HGBA1C in the last 72 hours. CBG: No results for input(s): GLUCAP in the last 168 hours. Lipid Profile: No results for input(s): CHOL, HDL, LDLCALC, TRIG, CHOLHDL, LDLDIRECT in the last 72 hours. Thyroid Function Tests: Recent Labs    12/21/18 0252  TSH 4.241   Anemia Panel: No results for input(s): VITAMINB12, FOLATE, FERRITIN, TIBC, IRON, RETICCTPCT in the last 72 hours. Sepsis Labs: No results for input(s):  PROCALCITON, LATICACIDVEN in the last 168 hours.  No results found for this or any previous visit (from the past 240 hour(s)).       Radiology Studies: Dg Chest 2 View  Result Date: 12/20/2018 CLINICAL DATA:  Pt c/o tachycardia, weakness and SOB. Pt states she has dyspnea at rest and on exertion and noticed she was weak and shaky today. RBBB (right bundle branch block); H/O diastolic dysfunction; Heart palpitations. EXAM: CHEST - 2 VIEW COMPARISON:  11/29/2017 FINDINGS: Cardiac silhouette is mildly enlarged. No mediastinal or hilar masses. There is no evidence of adenopathy. Clear lungs.  No pleural effusion or pneumothorax. Skeletal structures are demineralized but intact. IMPRESSION: No active cardiopulmonary disease. Electronically Signed   By: Lajean Manes M.D.   On: 12/20/2018 12:47        Scheduled Meds: . metoprolol tartrate  25 mg Oral BID   Continuous Infusions:   LOS: 0 days    Time spent: 28 minutes.     Hosie Poisson, MD [facility] Pager [phone]   If 7PM-7AM, please contact night-coverage [email or web address] Password [facility] 12/21/2018, 3:54 PM

## 2018-12-21 NOTE — Progress Notes (Signed)
Subjective:  Patient denies any chest pain complains of skipping of the heartbeat.  Remains in atrial flutter with moderate ventricular response had few beats of nonsustained VT earlier.  Objective:  Vital Signs in the last 24 hours: Temp:  [98 F (36.7 C)-98.6 F (37 C)] 98.4 F (36.9 C) (02/16 0538) Pulse Rate:  [29-111] 104 (02/16 0823) Resp:  [12-20] 18 (02/16 0823) BP: (107-154)/(52-95) 139/95 (02/16 0823) SpO2:  [99 %-100 %] 100 % (02/16 0823) Weight:  [49.9 kg-55.4 kg] 55.4 kg (02/15 1800)  Intake/Output from previous day: No intake/output data recorded. Intake/Output from this shift: Total I/O In: 120 [P.O.:120] Out: -   Physical Exam: Neck: no adenopathy, no carotid bruit, no JVD and supple, symmetrical, trachea midline Lungs: clear to auscultation bilaterally Heart: regularly irregular rhythm, S1, S2 normal and Soft systolic murmur noted Abdomen: soft, non-tender; bowel sounds normal; no masses,  no organomegaly Extremities: extremities normal, atraumatic, no cyanosis or edema  Lab Results: Recent Labs    12/20/18 1230 12/21/18 0252  WBC 7.2 5.7  HGB 12.0 11.2*  PLT 317 316   Recent Labs    12/20/18 1230 12/21/18 0252  NA 138 138  K 4.2 3.9  CL 104 105  CO2 24 25  GLUCOSE 96 107*  BUN 27* 30*  CREATININE 1.59* 1.61*   Recent Labs    12/20/18 2053 12/21/18 0252  TROPONINI <0.03 <0.03   Hepatic Function Panel Recent Labs    12/21/18 0252  PROT 6.6  ALBUMIN 3.4*  AST 22  ALT 10  ALKPHOS 89  BILITOT 0.6   No results for input(s): CHOL in the last 72 hours. No results for input(s): PROTIME in the last 72 hours.  Imaging: Imaging results have been reviewed and Dg Chest 2 View  Result Date: 12/20/2018 CLINICAL DATA:  Pt c/o tachycardia, weakness and SOB. Pt states she has dyspnea at rest and on exertion and noticed she was weak and shaky today. RBBB (right bundle branch block); H/O diastolic dysfunction; Heart palpitations. EXAM: CHEST - 2  VIEW COMPARISON:  11/29/2017 FINDINGS: Cardiac silhouette is mildly enlarged. No mediastinal or hilar masses. There is no evidence of adenopathy. Clear lungs.  No pleural effusion or pneumothorax. Skeletal structures are demineralized but intact. IMPRESSION: No active cardiopulmonary disease. Electronically Signed   By: Lajean Manes M.D.   On: 12/20/2018 12:47    Cardiac Studies:  Assessment/Plan:  New onset atrial flutter with moderate ventricular response now Status post nonsustained VT asymptomatic History of A. fib with RVR in the past chads vas score of 4 not on anticoagulation due to her age/unsteady gait/high risk for fall and pericardial effusion in the past Probably tachybradycardia syndrome Hypertension Compensated diastolic congestive heart failure Degenerative joint disease Chronic kidney disease stage III stable Plan Start low-dose beta-blocker as per orders 2D echo pending Dr. Doylene Canard to follow from a.m.  LOS: 0 days    Charolette Forward 12/21/2018, 10:08 AM

## 2018-12-22 ENCOUNTER — Observation Stay (HOSPITAL_COMMUNITY): Payer: Medicare Other

## 2018-12-22 DIAGNOSIS — I4892 Unspecified atrial flutter: Secondary | ICD-10-CM | POA: Diagnosis not present

## 2018-12-22 DIAGNOSIS — I361 Nonrheumatic tricuspid (valve) insufficiency: Secondary | ICD-10-CM | POA: Diagnosis not present

## 2018-12-22 DIAGNOSIS — I10 Essential (primary) hypertension: Secondary | ICD-10-CM | POA: Diagnosis not present

## 2018-12-22 DIAGNOSIS — I340 Nonrheumatic mitral (valve) insufficiency: Secondary | ICD-10-CM | POA: Diagnosis not present

## 2018-12-22 DIAGNOSIS — I471 Supraventricular tachycardia: Secondary | ICD-10-CM | POA: Diagnosis not present

## 2018-12-22 LAB — ECHOCARDIOGRAM COMPLETE
Height: 67 in
Weight: 1954.16 oz

## 2018-12-22 MED ORDER — DILTIAZEM HCL 30 MG PO TABS
30.0000 mg | ORAL_TABLET | Freq: Two times a day (BID) | ORAL | Status: DC
  Administered 2018-12-22 – 2018-12-23 (×3): 30 mg via ORAL
  Filled 2018-12-22 (×3): qty 1

## 2018-12-22 NOTE — Progress Notes (Signed)
  Echocardiogram 2D Echocardiogram has been performed.  Jannett Celestine 12/22/2018, 11:09 AM

## 2018-12-22 NOTE — Progress Notes (Signed)
  Echocardiogram 2D Echocardiogram has been performed.  Shannon Cruz 12/22/2018, 11:13 AM

## 2018-12-22 NOTE — Progress Notes (Addendum)
PROGRESS NOTE    Shannon Cruz  XLK:[id number] DOB: 1925/03/03 DOA: 12/20/2018 PCP: Dixie Dials, MD   Brief Narrative:  Shannon Cruz is a 85 y.o. female with medical history significant of hypertension, palpitations presenting with shortness of breath and palpitations.    Assessment & Plan:   Principal Problem:   Atrial flutter (HCC) Active Problems:   HTN (hypertension)  New onset atrial flutter with rvr on admission:  With some non sustained VT, during which she was asymptomatic.  Rate went up to 170/min on ambulation with PT, but once she is back to bed , he rate improved to 110/min. Currently on 25 BID of metoprolol.  Echocardiogram ordered and pending, awaiting cardiology recommendations.  Not on anti coagulation due to unsteady gait/ falls and age.  Mali VAS score of 4.  TSH wnl.     Diastolic heart failure: chronic and she appears compensated.    Stage 3 CKD:  Creatinine at baseline.    Mild anemia of chronic disease Hemoglobin between 11 to 12.    Hypertension:  Controlled.       DVT prophylaxis: scd's Code Status: full code.  Family Communication: none at bedside.  Disposition Plan: Pending clinical improvement, and rate control and further work up by cardiology, PT evaluation.   Consultants:  Cardiology  Procedures:  Echocardiogram.   Antimicrobials:None.   Subjective: No complaints of dizziness, palpitations today.   Objective: Vitals:   12/21/18 1011 12/21/18 1418 12/21/18 2030 12/22/18 0510  BP: 139/80 102/72 (!) 156/98 135/90  Pulse: (!) 106 91 97 (!) 110  Resp:  18 18 18   Temp:  98.3 F (36.8 C) 98.6 F (37 C) 98.3 F (36.8 C)  TempSrc:  Oral Oral Oral  SpO2:  100% 100% 100%  Weight:      Height:       No intake or output data in the 24 hours ending 12/22/18 1036 Filed Weights   12/20/18 1144 12/20/18 1800  Weight: 49.9 kg 55.4 kg    Examination:  General exam: Appears calm and comfortable , no distress  noted.  Respiratory system: Clear to auscultation. Respiratory effort normal. No wheezing or rhonchi.  Cardiovascular system: S1 & S2 heard,irregular, tachycardic, no pedal edema. .  Gastrointestinal system: Abdomen is nondistended, soft, non tender ,  bs+.  Central nervous system: Alert and oriented. Non focal.  Extremities: Symmetric 5 x 5 power. Skin: No rashes, lesions or ulcers Psychiatry: Mood & affect appropriate.     Data Reviewed: I have personally reviewed following labs and imaging studies  CBC: Recent Labs  Lab 12/20/18 1230 12/21/18 0252  WBC 7.2 5.7  HGB 12.0 11.2*  HCT 39.7 37.0  MCV 86.9 86.2  PLT 317 366   Basic Metabolic Panel: Recent Labs  Lab 12/20/18 1230 12/21/18 0252  NA 138 138  K 4.2 3.9  CL 104 105  CO2 24 25  GLUCOSE 96 107*  BUN 27* 30*  CREATININE 1.59* 1.61*  CALCIUM 9.6 9.2   GFR: Estimated Creatinine Clearance: 19.9 mL/min (A) (by C-G formula based on SCr of 1.61 mg/dL (H)). Liver Function Tests: Recent Labs  Lab 12/21/18 0252  AST 22  ALT 10  ALKPHOS 89  BILITOT 0.6  PROT 6.6  ALBUMIN 3.4*   No results for input(s): LIPASE, AMYLASE in the last 168 hours. No results for input(s): AMMONIA in the last 168 hours. Coagulation Profile: No results for input(s): INR, PROTIME in the last 168 hours. Cardiac Enzymes: Recent Labs  Lab  12/20/18 1208 12/20/18 1415 12/20/18 2053 12/21/18 0252 12/21/18 0848  TROPONINI <0.03 <0.03 <0.03 <0.03 <0.03   BNP (last 3 results) No results for input(s): PROBNP in the last 8760 hours. HbA1C: No results for input(s): HGBA1C in the last 72 hours. CBG: No results for input(s): GLUCAP in the last 168 hours. Lipid Profile: No results for input(s): CHOL, HDL, LDLCALC, TRIG, CHOLHDL, LDLDIRECT in the last 72 hours. Thyroid Function Tests: Recent Labs    12/21/18 0252  TSH 4.241   Anemia Panel: No results for input(s): VITAMINB12, FOLATE, FERRITIN, TIBC, IRON, RETICCTPCT in the last 72  hours. Sepsis Labs: No results for input(s): PROCALCITON, LATICACIDVEN in the last 168 hours.  No results found for this or any previous visit (from the past 240 hour(s)).       Radiology Studies: Dg Chest 2 View  Result Date: 12/20/2018 CLINICAL DATA:  Pt c/o tachycardia, weakness and SOB. Pt states she has dyspnea at rest and on exertion and noticed she was weak and shaky today. RBBB (right bundle branch block); H/O diastolic dysfunction; Heart palpitations. EXAM: CHEST - 2 VIEW COMPARISON:  11/29/2017 FINDINGS: Cardiac silhouette is mildly enlarged. No mediastinal or hilar masses. There is no evidence of adenopathy. Clear lungs.  No pleural effusion or pneumothorax. Skeletal structures are demineralized but intact. IMPRESSION: No active cardiopulmonary disease. Electronically Signed   By: Lajean Manes M.D.   On: 12/20/2018 12:47        Scheduled Meds: . diltiazem  30 mg Oral Q12H  . metoprolol tartrate  25 mg Oral BID   Continuous Infusions:   LOS: 0 days    Time spent: 26 minutes.     Hosie Poisson, MD [facility] Pager [phone]   If 7PM-7AM, please contact night-coverage [email or web address] Password [facility] 12/22/2018, 10:36 AM

## 2018-12-22 NOTE — Evaluation (Signed)
Physical Therapy Evaluation Patient Details Name: Shannon Cruz MRN: [id number] DOB: 04/26/1925 Today's Date: 12/22/2018   History of Present Illness  85 yo female admitted with dyspnea, palpitations. Dx A flutter. Hx of RBBB, OA  Clinical Impression  On eval, pt walked ~65 feet with a RW. She was Min guard assist for mobility. HR was 111 bpm at rest; 170 bpm during ambulation-RN aware. Will continue to follow. No family present during session. Pt stated her son helps as needed. Recommend HHPT at this time.     Follow Up Recommendations Home health PT;Supervision/Assistance - 24 hour    Equipment Recommendations  None recommended by PT    Recommendations for Other Services       Precautions / Restrictions Precautions Precaution Comments: monitor HR Restrictions Weight Bearing Restrictions: No      Mobility  Bed Mobility Overal bed mobility: Modified Independent Bed Mobility: Supine to Sit           General bed mobility comments: OOB in recliner upon arrival  Transfers Overall transfer level: Needs assistance Equipment used: Rolling walker (2 wheeled) Transfers: Sit to/from Stand Sit to Stand: Supervision         General transfer comment: for safety. Increased time.   Ambulation/Gait Ambulation/Gait assistance: Min guard Gait Distance (Feet): 65 Feet Assistive device: Rolling walker (2 wheeled) Gait Pattern/deviations: Step-through pattern   Gait velocity interpretation: <1.31 ft/sec, indicative of household ambulator General Gait Details: slow gait speed. Close guard for safety. Pt insisted that therapist refrain from holding on to her  Stairs            Wheelchair Mobility    Modified Rankin (Stroke Patients Only)       Balance Overall balance assessment: Needs assistance         Standing balance support: Bilateral upper extremity supported Standing balance-Leahy Scale: Poor                               Pertinent  Vitals/Pain Pain Assessment: Faces Faces Pain Scale: Hurts little more Pain Location: knees Pain Descriptors / Indicators: Sore;Discomfort Pain Intervention(s): Limited activity within patient's tolerance;Monitored during session    Home Living Family/patient expects to be discharged to:: Private residence Living Arrangements: Alone Available Help at Discharge: Family;Available PRN/intermittently(has son) Type of Home: House Home Access: Stairs to enter Entrance Stairs-Rails: Right;Left;Can reach both Entrance Stairs-Number of Steps: 4 Home Layout: One level Home Equipment: Walker - 2 wheels;Cane - single point      Prior Function Level of Independence: Independent with assistive device(s)   Gait / Transfers Assistance Needed: uses RW for ambulation outside of home; no device inside home     Comments: still cooks and cleans     Hand Dominance        Extremity/Trunk Assessment   Upper Extremity Assessment Upper Extremity Assessment: Defer to OT evaluation    Lower Extremity Assessment Lower Extremity Assessment: Generalized weakness(severe genu varus bilaterally)       Communication   Communication: HOH  Cognition Arousal/Alertness: Awake/alert Behavior During Therapy: WFL for tasks assessed/performed Overall Cognitive Status: Within Functional Limits for tasks assessed                                 General Comments: some difficulty fully assessing due to hearing loss      General Comments      Exercises  Assessment/Plan    PT Assessment Patient needs continued PT services  PT Problem List Decreased strength;Decreased mobility;Decreased activity tolerance;Decreased balance       PT Treatment Interventions DME instruction;Gait training;Therapeutic activities;Functional mobility training;Balance training;Therapeutic exercise    PT Goals (Current goals can be found in the Care Plan section)  Acute Rehab PT Goals Patient Stated Goal:  return home PT Goal Formulation: With patient Time For Goal Achievement: 01/05/19 Potential to Achieve Goals: Good    Frequency Min 3X/week   Barriers to discharge        Co-evaluation               AM-PAC PT "6 Clicks" Mobility  Outcome Measure Help needed turning from your back to your side while in a flat bed without using bedrails?: None Help needed moving from lying on your back to sitting on the side of a flat bed without using bedrails?: None Help needed moving to and from a bed to a chair (including a wheelchair)?: A Little Help needed standing up from a chair using your arms (e.g., wheelchair or bedside chair)?: A Little Help needed to walk in hospital room?: A Little Help needed climbing 3-5 steps with a railing? : A Little 6 Click Score: 20    End of Session Equipment Utilized During Treatment: Gait belt Activity Tolerance: Patient tolerated treatment well Patient left: in bed;with call bell/phone within reach;with bed alarm set   PT Visit Diagnosis: Unsteadiness on feet (R26.81);Muscle weakness (generalized) (M62.81);Difficulty in walking, not elsewhere classified (R26.2)    Time: 0950-1009 PT Time Calculation (min) (ACUTE ONLY): 19 min   Charges:   PT Evaluation $PT Eval Moderate Complexity: [place], PT Acute Rehabilitation Services Pager: [phone] Office: [phone]

## 2018-12-22 NOTE — Care Management Note (Signed)
Case Management Note  Patient Details  Name: Shannon Cruz MRN: [id number] Date of Birth: 1925-04-11  Subjective/Objective: Patient states I have all the help I need @ home. Pt recc HHPT-patient pleasantly declines HHC. No further CM needs.                   Action/Plan:dc home   Expected Discharge Date:                  Expected Discharge Plan:  Home/Self Care  In-House Referral:     Discharge planning Services     Post Acute Care Choice:  Durable Medical Equipment(rw) Choice offered to:  Patient  DME Arranged:    DME Agency:     HH Arranged:  Patient Refused [facility] Agency:     Status of Service:  Completed, signed off  If discussed at [employer] of Stay Meetings, dates discussed:    Additional Comments:  Dessa Phi, RN 12/22/2018, 3:40 PM

## 2018-12-22 NOTE — Progress Notes (Signed)
Ref: Dixie Dials, MD   Subjective:  Awake. No chest pain. Monitor shows sinus rhythm to Sinus tachycardia.   Objective:  Vital Signs in the last 24 hours: Temp:  [98.3 F (36.8 C)-98.6 F (37 C)] 98.3 F (36.8 C) (02/17 0510) Pulse Rate:  [91-110] 110 (02/17 0510) Cardiac Rhythm: Sinus tachycardia (02/17 0739) Resp:  [18] 18 (02/17 0510) BP: (102-156)/(72-98) 135/90 (02/17 0510) SpO2:  [100 %] 100 % (02/17 0510)  Physical Exam: BP Readings from Last 1 Encounters:  12/22/18 135/90     Wt Readings from Last 1 Encounters:  12/20/18 55.4 kg    Weight change:  Body mass index is 19.13 kg/m. HEENT: [place]/AT, Eyes-Brown, PERL, EOMI, Conjunctiva-Pink, Sclera-Non-icteric Neck: No JVD, No bruit, Trachea midline. Lungs:  Clear, Bilateral. Cardiac:  Regular rhythm, normal S1 and S2, no S3. II/VI systolic murmur. Abdomen:  Soft, non-tender. BS present. Extremities:  No edema present. No cyanosis. No clubbing. CNS: AxOx3, Cranial nerves grossly intact, moves all 4 extremities. Slow walk with walker use. Skin: Warm and dry.   Intake/Output from previous day: 02/16 0701 - 02/17 0700 In: 120 [P.O.:120] Out: -     Lab Results: BMET    Component Value Date/Time   NA 138 12/21/2018 0252   NA 138 12/20/2018 1230   NA 142 06/22/2018 1800   K 3.9 12/21/2018 0252   K 4.2 12/20/2018 1230   K 3.6 06/22/2018 1800   CL 105 12/21/2018 0252   CL 104 12/20/2018 1230   CL 103 06/22/2018 1800   CO2 25 12/21/2018 0252   CO2 24 12/20/2018 1230   CO2 29 06/22/2018 1800   GLUCOSE 107 (H) 12/21/2018 0252   GLUCOSE 96 12/20/2018 1230   GLUCOSE 104 (H) 06/22/2018 1800   BUN 30 (H) 12/21/2018 0252   BUN 27 (H) 12/20/2018 1230   BUN 21 06/22/2018 1800   CREATININE 1.61 (H) 12/21/2018 0252   CREATININE 1.59 (H) 12/20/2018 1230   CREATININE 1.53 (H) 06/22/2018 1800   CALCIUM 9.2 12/21/2018 0252   CALCIUM 9.6 12/20/2018 1230   CALCIUM 9.5 06/22/2018 1800   GFRNONAA 28 (L) 12/21/2018 0252   GFRNONAA 28 (L) 12/20/2018 1230   GFRNONAA 29 (L) 06/22/2018 1800   GFRAA 32 (L) 12/21/2018 0252   GFRAA 33 (L) 12/20/2018 1230   GFRAA 33 (L) 06/22/2018 1800   CBC    Component Value Date/Time   WBC 5.7 12/21/2018 0252   RBC 4.29 12/21/2018 0252   HGB 11.2 (L) 12/21/2018 0252   HCT 37.0 12/21/2018 0252   PLT 316 12/21/2018 0252   MCV 86.2 12/21/2018 0252   MCH 26.1 12/21/2018 0252   MCHC 30.3 12/21/2018 0252   RDW 16.6 (H) 12/21/2018 0252   LYMPHSABS 1.1 06/22/2018 1800   MONOABS 0.6 06/22/2018 1800   EOSABS 0.1 06/22/2018 1800   BASOSABS 0.0 06/22/2018 1800   HEPATIC Function Panel Recent Labs    04/09/18 1614 06/22/18 1800 12/21/18 0252  PROT 7.0 7.5 6.6   HEMOGLOBIN A1C No components found for: HGA1C,  MPG CARDIAC ENZYMES Lab Results  Component Value Date   TROPONINI <0.03 12/21/2018   TROPONINI <0.03 12/21/2018   TROPONINI <0.03 12/20/2018   BNP No results for input(s): PROBNP in the last 8760 hours. TSH Recent Labs    12/21/18 0252  TSH 4.241   CHOLESTEROL No results for input(s): CHOL in the last 8760 hours.  Scheduled Meds: . metoprolol tartrate  25 mg Oral BID   Continuous Infusions: PRN Meds:.acetaminophen **  OR** acetaminophen  Assessment/Plan: Paroxysmal atrial flutter, CHA2DS2VASc score of 4 S/P non-sustained VT HTN H/O diastolic heart failure Degenerative joint disease CKD, II  Continue medical treatment. Awaiting echocardiogram. Not a candidate for anticoagulation due to unsteady gait and high risk of fall.   LOS: 0 days    Dixie Dials  MD  12/22/2018, 10:00 AM

## 2018-12-22 NOTE — Evaluation (Addendum)
Occupational Therapy Evaluation Patient Details Name: Shannon Cruz MRN: [id number] DOB: [birth year] Today's Date: 12/22/2018    History of Present Illness Pt is a 85 y.o. female with medical history significant of hypertension, palpitations presenting with shortness of breath and palpitations.    Clinical Impression   This 85 y/o female presents with the above. At baseline pt reports she is mod independent with ADL and functional mobility using RW, reports she still performs some iADLs including cooking/cleaning. Pt presents sitting up in recliner pleasant and willing to participate in therapy session. She currently requires minA for room level mobility using RW, minA for LB ADL and setup-minguard assist for UB ADL. Hr up to 126 with room level activity. Pt reports she lives alone but has family/neighbors who check in daily. She will benefit from continued OT services while in acute setting; recommend pt have 24hr supervision/assist initially after discharge home to maximize her safety and independence with ADL and mobility. Will follow.     Follow Up Recommendations  Supervision/Assistance - 24 hour    Equipment Recommendations  3 in 1 bedside commode(for use in shower )           Precautions / Restrictions Precautions Precaution Comments: monitor HR Restrictions Weight Bearing Restrictions: No      Mobility Bed Mobility               General bed mobility comments: OOB in recliner upon arrival  Transfers Overall transfer level: Needs assistance Equipment used: Rolling walker (2 wheeled) Transfers: Sit to/from Stand Sit to Stand: Min assist         General transfer comment: assist to rise and steady, increased time to perform; pt demonstrates safe hand placement throughout    Balance                                           ADL either performed or assessed with clinical judgement   ADL Overall ADL's : Needs  assistance/impaired Eating/Feeding: Set up;Sitting Eating/Feeding Details (indicate cue type and reason): to open drink containers Grooming: Set up;Sitting   Upper Body Bathing: Min guard;Sitting   Lower Body Bathing: Minimal assistance;Sit to/from stand   Upper Body Dressing : Sitting;Min guard   Lower Body Dressing: Minimal assistance;Sit to/from stand   Toilet Transfer: Minimal assistance;Ambulation;RW Toilet Transfer Details (indicate cue type and reason): simulated via transfer to/from recliner, room level mobility  Toileting- Clothing Manipulation and Hygiene: Minimal assistance;Sit to/from stand       Functional mobility during ADLs: Minimal assistance;Rolling walker                           Pertinent Vitals/Pain Pain Assessment: Faces Faces Pain Scale: Hurts little more Pain Location: R knee, chronic arthritis Pain Descriptors / Indicators: Aching Pain Intervention(s): Monitored during session;Limited activity within patient's tolerance     Hand Dominance     Extremity/Trunk Assessment Upper Extremity Assessment Upper Extremity Assessment: Generalized weakness   Lower Extremity Assessment Lower Extremity Assessment: Defer to PT evaluation       Communication Communication Communication: HOH   Cognition Arousal/Alertness: Awake/alert Behavior During Therapy: WFL for tasks assessed/performed Overall Cognitive Status: Within Functional Limits for tasks assessed  General Comments: requires repetition due to pt is [facility]   General Comments       Exercises     Shoulder Instructions      Home Living Family/patient expects to be discharged to:: Private residence Living Arrangements: Alone Available Help at Discharge: Family(sons helps, reports neighbor checks in ) Type of Home: House Home Access: Stairs to enter [employer] of Steps: 4/5 Entrance Stairs-Rails: Right;Left;Can reach  both [place]: One level     Bathroom Shower/Tub: Teacher, early years/pre: [place]: Environmental consultant - 2 wheels;Cane - single point          Prior Functioning/Environment Level of Independence: Needs assistance  Gait / Transfers Assistance Needed: uses RW for ambulation outside of home.      Comments: still cooks and cleans        OT Problem List: Decreased strength;Decreased activity tolerance;Impaired balance (sitting and/or standing)      OT Treatment/Interventions: Self-care/ADL training;Therapeutic exercise;DME and/or AE instruction;Therapeutic activities;Patient/family education;Balance training    OT Goals(Current goals can be found in the care plan section) Acute Rehab OT Goals Patient Stated Goal: return home OT Goal Formulation: With patient Time For Goal Achievement: 01/05/19 Potential to Achieve Goals: Good  OT Frequency: Min 2X/week   Barriers to D/C:            Co-evaluation              AM-PAC OT "6 Clicks" Daily Activity     Outcome Measure Help from another person eating meals?: A Little Help from another person taking care of personal grooming?: A Little Help from another person toileting, which includes using toliet, bedpan, or urinal?: A Little Help from another person bathing (including washing, rinsing, drying)?: A Little Help from another person to put on and taking off regular upper body clothing?: A Little Help from another person to put on and taking off regular lower body clothing?: A Little 6 Click Score: 18   End of Session Equipment Utilized During Treatment: Rolling walker;Gait belt Nurse Communication: Mobility status  Activity Tolerance: Patient tolerated treatment well Patient left: in chair;with call bell/phone within reach  OT Visit Diagnosis: Muscle weakness (generalized) (M62.81)                Time: 2703-5009 OT Time Calculation (min): 21 min Charges:  OT General Charges $OT Visit: 1  Visit OT Evaluation $OT Eval Moderate Complexity: 1 Mod  Shannon Cruz, OT [employer] Pager [phone] Office [phone]   Raymondo Band 12/22/2018, 12:43 PM

## 2018-12-23 DIAGNOSIS — I340 Nonrheumatic mitral (valve) insufficiency: Secondary | ICD-10-CM | POA: Diagnosis not present

## 2018-12-23 DIAGNOSIS — R000 Tachycardia, unspecified: Secondary | ICD-10-CM

## 2018-12-23 DIAGNOSIS — I471 Supraventricular tachycardia: Secondary | ICD-10-CM | POA: Diagnosis not present

## 2018-12-23 DIAGNOSIS — I4892 Unspecified atrial flutter: Secondary | ICD-10-CM | POA: Diagnosis not present

## 2018-12-23 DIAGNOSIS — I361 Nonrheumatic tricuspid (valve) insufficiency: Secondary | ICD-10-CM | POA: Diagnosis not present

## 2018-12-23 DIAGNOSIS — I10 Essential (primary) hypertension: Secondary | ICD-10-CM | POA: Diagnosis not present

## 2018-12-23 MED ORDER — DILTIAZEM HCL 30 MG PO TABS: 30.0000 mg | ORAL_TABLET | Freq: Two times a day (BID) | ORAL | 0 refills | Status: DC

## 2018-12-23 MED ORDER — ACETAMINOPHEN 325 MG PO TABS: 650.0000 mg | ORAL_TABLET | Freq: Four times a day (QID) | ORAL | Status: DC | PRN

## 2018-12-23 NOTE — Progress Notes (Signed)
Patient and her son given discharge, follow up, and medication instructions, verbalized understanding, IV and tel;emetry monitor removed, personal belongings with patient, family to transport home

## 2018-12-23 NOTE — Consult Note (Signed)
Ref: Dixie Dials, MD   Subjective:  Awake. Sitting up. VS stable. Heart rate in 90's. Moderate MR and TR with LVH and preserved LV systolic function.  Objective:  Vital Signs in the last 24 hours: Temp:  [97.6 F (36.4 C)-98.4 F (36.9 C)] 98.3 F (36.8 C) (02/18 0517) Pulse Rate:  [69-92] 69 (02/18 0517) Cardiac Rhythm: Heart block (02/17 1911) Resp:  [15-18] 18 (02/18 0517) BP: (110-152)/(75-86) 152/79 (02/18 0517) SpO2:  [100 %] 100 % (02/18 0517)  Physical Exam: BP Readings from Last 1 Encounters:  12/23/18 (!) 152/79     Wt Readings from Last 1 Encounters:  12/20/18 55.4 kg    Weight change:  Body mass index is 19.13 kg/m. HEENT: [place]/AT, Eyes-Brown, PERL, EOMI, Conjunctiva-Pink, Sclera-Non-icteric Neck: No JVD, No bruit, Trachea midline. Lungs:  Clear, Bilateral. Cardiac:  Regular rhythm, normal S1 and S2, no S3. II/VI systolic murmur. Abdomen:  Soft, non-tender. BS present. Extremities:  No edema present. No cyanosis. No clubbing. CNS: AxOx3, Cranial nerves grossly intact, moves all 4 extremities.  Skin: Warm and dry.   Intake/Output from previous day: 02/17 0701 - 02/18 0700 In: 120 [P.O.:120] Out: -     Lab Results: BMET    Component Value Date/Time   NA 138 12/21/2018 0252   NA 138 12/20/2018 1230   NA 142 06/22/2018 1800   K 3.9 12/21/2018 0252   K 4.2 12/20/2018 1230   K 3.6 06/22/2018 1800   CL 105 12/21/2018 0252   CL 104 12/20/2018 1230   CL 103 06/22/2018 1800   CO2 25 12/21/2018 0252   CO2 24 12/20/2018 1230   CO2 29 06/22/2018 1800   GLUCOSE 107 (H) 12/21/2018 0252   GLUCOSE 96 12/20/2018 1230   GLUCOSE 104 (H) 06/22/2018 1800   BUN 30 (H) 12/21/2018 0252   BUN 27 (H) 12/20/2018 1230   BUN 21 06/22/2018 1800   CREATININE 1.61 (H) 12/21/2018 0252   CREATININE 1.59 (H) 12/20/2018 1230   CREATININE 1.53 (H) 06/22/2018 1800   CALCIUM 9.2 12/21/2018 0252   CALCIUM 9.6 12/20/2018 1230   CALCIUM 9.5 06/22/2018 1800   GFRNONAA 28 (L)  12/21/2018 0252   GFRNONAA 28 (L) 12/20/2018 1230   GFRNONAA 29 (L) 06/22/2018 1800   GFRAA 32 (L) 12/21/2018 0252   GFRAA 33 (L) 12/20/2018 1230   GFRAA 33 (L) 06/22/2018 1800   CBC    Component Value Date/Time   WBC 5.7 12/21/2018 0252   RBC 4.29 12/21/2018 0252   HGB 11.2 (L) 12/21/2018 0252   HCT 37.0 12/21/2018 0252   PLT 316 12/21/2018 0252   MCV 86.2 12/21/2018 0252   MCH 26.1 12/21/2018 0252   MCHC 30.3 12/21/2018 0252   RDW 16.6 (H) 12/21/2018 0252   LYMPHSABS 1.1 06/22/2018 1800   MONOABS 0.6 06/22/2018 1800   EOSABS 0.1 06/22/2018 1800   BASOSABS 0.0 06/22/2018 1800   HEPATIC Function Panel Recent Labs    04/09/18 1614 06/22/18 1800 12/21/18 0252  PROT 7.0 7.5 6.6   HEMOGLOBIN A1C No components found for: HGA1C,  MPG CARDIAC ENZYMES Lab Results  Component Value Date   TROPONINI <0.03 12/21/2018   TROPONINI <0.03 12/21/2018   TROPONINI <0.03 12/20/2018   BNP No results for input(s): PROBNP in the last 8760 hours. TSH Recent Labs    12/21/18 0252  TSH 4.241   CHOLESTEROL No results for input(s): CHOL in the last 8760 hours.  Scheduled Meds: . diltiazem  30 mg Oral Q12H  .  metoprolol tartrate  25 mg Oral BID   Continuous Infusions: PRN Meds:.acetaminophen **OR** acetaminophen  Assessment/Plan: Paroxysmal atrial flutter S/P non sustained VT HTN H/O diastolic heart failure CKD, II Degenerative joint disease Non-obstructive LVH Moderate MR and TR  May discharge. F/U in 1 week.   LOS: 0 days    Dixie Dials  MD  12/23/2018, 10:08 AM

## 2018-12-27 NOTE — Discharge Summary (Signed)
Physician Discharge Summary  Shannon Cruz MWN:[id number] DOB: 1925/02/25 DOA: 12/20/2018  PCP: Dixie Dials, MD  Admit date: 12/20/2018 Discharge date: 12/23/2018  Admitted From: Home  Disposition:  Home.   Recommendations for Outpatient Follow-up:  1. Follow up with PCP in 1-2 weeks 2. Please obtain BMP/CBC in one week Please follow up with cardiology as recommended.   Discharge Condition:stable.  CODE STATUS: full code.  Diet recommendation: Heart Healthy  Brief/Interim Summary: Shannon Cruz a 85 y.o.femalewithwith medical history significant ofhypertension, palpitations presenting with shortness of breath and palpitations.   Discharge Diagnoses:  Principal Problem:   Atrial flutter ([place]) Active Problems:   HTN (hypertension)  New onset atrial flutter with rvr on admission:  With some non sustained VT, during which she was asymptomatic.  Rate went up to 170/min on ambulation with PT, but once she is back to bed , he rate improved to 110/min. Currently on 25 BID of metoprolol. diltiazem added and her rate improved.  Echocardiogram ordered and pending, awaiting cardiology recommendations.  Not on anti coagulation due to unsteady gait/ falls and age.  Mali VAS score of 4.  TSH wnl.     Diastolic heart failure: chronic and she appears compensated.    Stage 3 CKD:  Creatinine at baseline.    Mild anemia of chronic disease Hemoglobin between 11 to 12.    Hypertension:  Controlled.  Discharge Instructions  Discharge Instructions    Diet - low sodium heart healthy   Complete by:  As directed    Discharge instructions   Complete by:  As directed    Follow up with PCP in one week.     Allergies as of 12/23/2018   No Known Allergies     Medication List    STOP taking these medications   ascorbic acid 250 MG tablet Commonly known as:  VITAMIN C   phenazopyridine 200 MG tablet Commonly known as:  PYRIDIUM     TAKE these medications    acetaminophen 325 MG tablet Commonly known as:  TYLENOL Take 2 tablets (650 mg total) by mouth every 6 (six) hours as needed for mild pain (or Fever >/= 101). What changed:    medication strength  how much to take  reasons to take this   aspirin 81 MG chewable tablet Chew 81 mg by mouth daily.   diltiazem 30 MG tablet Commonly known as:  CARDIZEM Take 1 tablet (30 mg total) by mouth every 12 (twelve) hours. What changed:    medication strength  how much to take  when to take this   furosemide 20 MG tablet Commonly known as:  LASIX Take 1 tablet (20 mg total) by mouth daily.   metoprolol tartrate 25 MG tablet Commonly known as:  LOPRESSOR Take 1 tablet (25 mg total) by mouth 2 (two) times daily.   nitroGLYCERIN 0.4 MG SL tablet Commonly known as:  NITROSTAT Place 0.4 mg under the tongue every 5 (five) minutes as needed for chest pain.   potassium chloride 10 MEQ tablet Commonly known as:  K-DUR,KLOR-CON Take 1 tablet (10 mEq total) by mouth daily.      Follow-up Information    Dixie Dials, MD. Schedule an appointment as soon as possible for a visit in 1 week(s).   Specialty:  Cardiology Contact information: [place] [address] [phone]          No Known Allergies  Consultations:  Cardiology.    Procedures/Studies: Dg Chest 2 View  Result  Date: 12/20/2018 CLINICAL DATA:  Pt c/o tachycardia, weakness and SOB. Pt states she has dyspnea at rest and on exertion and noticed she was weak and shaky today. RBBB (right bundle branch block); H/O diastolic dysfunction; Heart palpitations. EXAM: CHEST - 2 VIEW COMPARISON:  11/29/2017 FINDINGS: Cardiac silhouette is mildly enlarged. No mediastinal or hilar masses. There is no evidence of adenopathy. Clear lungs.  No pleural effusion or pneumothorax. Skeletal structures are demineralized but intact. IMPRESSION: No active cardiopulmonary disease. Electronically Signed   By: Lajean Manes  M.D.   On: 12/20/2018 12:47   Subjective: No chest pain or palpitations.   Discharge Exam: Vitals:   12/22/18 2019 12/23/18 0517  BP: 110/75 (!) 152/79  Pulse: 92 69  Resp: 18 18  Temp: 98.4 F (36.9 C) 98.3 F (36.8 C)  SpO2: 100% 100%   Vitals:   12/22/18 1200 12/22/18 1410 12/22/18 2019 12/23/18 0517  BP:  130/86 110/75 (!) 152/79  Pulse: 92 90 92 69  Resp:  15 18 18   Temp:  97.6 F (36.4 C) 98.4 F (36.9 C) 98.3 F (36.8 C)  TempSrc:  Oral Oral   SpO2:  100% 100% 100%  Weight:      Height:        General: Pt is alert, awake, not in acute distress Cardiovascular: irregular, S1/S2 +, no rubs, no gallops Respiratory: CTA bilaterally, no wheezing, no rhonchi Abdominal: Soft, NT, ND, bowel sounds + Extremities: no edema, no cyanosis    The results of significant diagnostics from this hospitalization (including imaging, microbiology, ancillary and laboratory) are listed below for reference.     Microbiology: No results found for this or any previous visit (from the past 240 hour(s)).   Labs: BNP (last 3 results) No results for input(s): BNP in the last 8760 hours. Basic Metabolic Panel: Recent Labs  Lab 12/21/18 0252  NA 138  K 3.9  CL 105  CO2 25  GLUCOSE 107*  BUN 30*  CREATININE 1.61*  CALCIUM 9.2   Liver Function Tests: Recent Labs  Lab 12/21/18 0252  AST 22  ALT 10  ALKPHOS 89  BILITOT 0.6  PROT 6.6  ALBUMIN 3.4*   No results for input(s): LIPASE, AMYLASE in the last 168 hours. No results for input(s): AMMONIA in the last 168 hours. CBC: Recent Labs  Lab 12/21/18 0252  WBC 5.7  HGB 11.2*  HCT 37.0  MCV 86.2  PLT 316   Cardiac Enzymes: Recent Labs  Lab 12/20/18 2053 12/21/18 0252 12/21/18 0848  TROPONINI <0.03 <0.03 <0.03   BNP: Invalid input(s): POCBNP CBG: No results for input(s): GLUCAP in the last 168 hours. D-Dimer No results for input(s): DDIMER in the last 72 hours. Hgb A1c No results for input(s): HGBA1C in  the last 72 hours. Lipid Profile No results for input(s): CHOL, HDL, LDLCALC, TRIG, CHOLHDL, LDLDIRECT in the last 72 hours. Thyroid function studies No results for input(s): TSH, T4TOTAL, T3FREE, THYROIDAB in the last 72 hours.  Invalid input(s): FREET3 Anemia work up No results for input(s): VITAMINB12, FOLATE, FERRITIN, TIBC, IRON, RETICCTPCT in the last 72 hours. Urinalysis    Component Value Date/Time   COLORURINE YELLOW 06/22/2018 1722   APPEARANCEUR HAZY (A) 06/22/2018 1722   LABSPEC 1.012 06/22/2018 1722   PHURINE 6.0 06/22/2018 1722   GLUCOSEU NEGATIVE 06/22/2018 1722   HGBUR MODERATE (A) 06/22/2018 1722   BILIRUBINUR NEGATIVE 06/22/2018 1722   KETONESUR NEGATIVE 06/22/2018 1722   PROTEINUR NEGATIVE 06/22/2018 1722   UROBILINOGEN  0.2 01/24/2015 0736   NITRITE NEGATIVE 06/22/2018 1722   LEUKOCYTESUR LARGE (A) 06/22/2018 1722   Sepsis Labs Invalid input(s): PROCALCITONIN,  WBC,  LACTICIDVEN Microbiology No results found for this or any previous visit (from the past 240 hour(s)).   Time coordinating discharge: 32 minutes  SIGNED:   Hosie Poisson, MD  [facility] 12/27/2018, 8:48 PM Pager   If 7PM-7AM, please contact night-coverage [email or web address] Password [email or web address]

## 2019-01-01 DIAGNOSIS — M25562 Pain in left knee: Secondary | ICD-10-CM | POA: Diagnosis not present

## 2019-01-01 DIAGNOSIS — I471 Supraventricular tachycardia: Secondary | ICD-10-CM | POA: Diagnosis not present

## 2019-01-01 DIAGNOSIS — I10 Essential (primary) hypertension: Secondary | ICD-10-CM | POA: Diagnosis not present

## 2019-01-01 DIAGNOSIS — R072 Precordial pain: Secondary | ICD-10-CM | POA: Diagnosis not present

## 2019-05-05 DIAGNOSIS — M25561 Pain in right knee: Secondary | ICD-10-CM | POA: Diagnosis not present

## 2019-05-05 DIAGNOSIS — R072 Precordial pain: Secondary | ICD-10-CM | POA: Diagnosis not present

## 2019-05-05 DIAGNOSIS — M25562 Pain in left knee: Secondary | ICD-10-CM | POA: Diagnosis not present

## 2019-05-05 DIAGNOSIS — I10 Essential (primary) hypertension: Secondary | ICD-10-CM | POA: Diagnosis not present

## 2019-05-17 ENCOUNTER — Other Ambulatory Visit: Payer: Self-pay

## 2019-05-17 ENCOUNTER — Emergency Department (HOSPITAL_COMMUNITY): Payer: Medicare Other

## 2019-05-17 ENCOUNTER — Encounter (HOSPITAL_COMMUNITY): Payer: Self-pay | Admitting: Obstetrics and Gynecology

## 2019-05-17 ENCOUNTER — Inpatient Hospital Stay (HOSPITAL_COMMUNITY)
Admission: EM | Admit: 2019-05-17 | Discharge: 2019-05-21 | DRG: 309 | Disposition: A | Discharge status: Home / Self Care | Payer: Medicare Other | Attending: Cardiovascular Disease | Admitting: Cardiovascular Disease

## 2019-05-17 DIAGNOSIS — N182 Chronic kidney disease, stage 2 (mild): Secondary | ICD-10-CM | POA: Diagnosis present

## 2019-05-17 DIAGNOSIS — M25561 Pain in right knee: Secondary | ICD-10-CM | POA: Diagnosis not present

## 2019-05-17 DIAGNOSIS — I517 Cardiomegaly: Secondary | ICD-10-CM | POA: Diagnosis not present

## 2019-05-17 DIAGNOSIS — W06XXXA Fall from bed, initial encounter: Secondary | ICD-10-CM | POA: Diagnosis present

## 2019-05-17 DIAGNOSIS — Z87891 Personal history of nicotine dependence: Secondary | ICD-10-CM | POA: Diagnosis not present

## 2019-05-17 DIAGNOSIS — M1611 Unilateral primary osteoarthritis, right hip: Secondary | ICD-10-CM | POA: Diagnosis not present

## 2019-05-17 DIAGNOSIS — Z1159 Encounter for screening for other viral diseases: Secondary | ICD-10-CM | POA: Diagnosis not present

## 2019-05-17 DIAGNOSIS — R2241 Localized swelling, mass and lump, right lower limb: Secondary | ICD-10-CM | POA: Diagnosis not present

## 2019-05-17 DIAGNOSIS — R531 Weakness: Secondary | ICD-10-CM | POA: Diagnosis present

## 2019-05-17 DIAGNOSIS — S299XXA Unspecified injury of thorax, initial encounter: Secondary | ICD-10-CM | POA: Diagnosis not present

## 2019-05-17 DIAGNOSIS — M8580 Other specified disorders of bone density and structure, unspecified site: Secondary | ICD-10-CM | POA: Diagnosis not present

## 2019-05-17 DIAGNOSIS — Z7982 Long term (current) use of aspirin: Secondary | ICD-10-CM

## 2019-05-17 DIAGNOSIS — R001 Bradycardia, unspecified: Secondary | ICD-10-CM | POA: Diagnosis not present

## 2019-05-17 DIAGNOSIS — M5030 Other cervical disc degeneration, unspecified cervical region: Secondary | ICD-10-CM | POA: Diagnosis not present

## 2019-05-17 DIAGNOSIS — I4891 Unspecified atrial fibrillation: Secondary | ICD-10-CM | POA: Diagnosis present

## 2019-05-17 DIAGNOSIS — I482 Chronic atrial fibrillation, unspecified: Principal | ICD-10-CM | POA: Diagnosis present

## 2019-05-17 DIAGNOSIS — Y92009 Unspecified place in unspecified non-institutional (private) residence as the place of occurrence of the external cause: Secondary | ICD-10-CM

## 2019-05-17 DIAGNOSIS — I4892 Unspecified atrial flutter: Secondary | ICD-10-CM | POA: Diagnosis present

## 2019-05-17 DIAGNOSIS — I10 Essential (primary) hypertension: Secondary | ICD-10-CM | POA: Diagnosis not present

## 2019-05-17 DIAGNOSIS — M79604 Pain in right leg: Secondary | ICD-10-CM | POA: Diagnosis not present

## 2019-05-17 DIAGNOSIS — I452 Bifascicular block: Secondary | ICD-10-CM | POA: Diagnosis not present

## 2019-05-17 DIAGNOSIS — I129 Hypertensive chronic kidney disease with stage 1 through stage 4 chronic kidney disease, or unspecified chronic kidney disease: Secondary | ICD-10-CM | POA: Diagnosis not present

## 2019-05-17 DIAGNOSIS — S79911A Unspecified injury of right hip, initial encounter: Secondary | ICD-10-CM | POA: Diagnosis not present

## 2019-05-17 DIAGNOSIS — M1711 Unilateral primary osteoarthritis, right knee: Secondary | ICD-10-CM | POA: Diagnosis not present

## 2019-05-17 DIAGNOSIS — S82144A Nondisplaced bicondylar fracture of right tibia, initial encounter for closed fracture: Secondary | ICD-10-CM | POA: Diagnosis not present

## 2019-05-17 DIAGNOSIS — I4821 Permanent atrial fibrillation: Secondary | ICD-10-CM | POA: Diagnosis not present

## 2019-05-17 DIAGNOSIS — G8911 Acute pain due to trauma: Secondary | ICD-10-CM | POA: Diagnosis not present

## 2019-05-17 DIAGNOSIS — S82142D Displaced bicondylar fracture of left tibia, subsequent encounter for closed fracture with routine healing: Secondary | ICD-10-CM | POA: Diagnosis not present

## 2019-05-17 DIAGNOSIS — S82141D Displaced bicondylar fracture of right tibia, subsequent encounter for closed fracture with routine healing: Secondary | ICD-10-CM

## 2019-05-17 DIAGNOSIS — S199XXA Unspecified injury of neck, initial encounter: Secondary | ICD-10-CM | POA: Diagnosis not present

## 2019-05-17 DIAGNOSIS — S0990XA Unspecified injury of head, initial encounter: Secondary | ICD-10-CM | POA: Diagnosis not present

## 2019-05-17 DIAGNOSIS — W19XXXA Unspecified fall, initial encounter: Secondary | ICD-10-CM | POA: Diagnosis not present

## 2019-05-17 DIAGNOSIS — M25551 Pain in right hip: Secondary | ICD-10-CM | POA: Diagnosis not present

## 2019-05-17 LAB — CBC WITH DIFFERENTIAL/PLATELET
Abs Immature Granulocytes: 0.03 10*3/uL (ref 0.00–0.07)
Basophils Absolute: 0.1 10*3/uL (ref 0.0–0.1)
Basophils Relative: 1 %
Eosinophils Absolute: 0 10*3/uL (ref 0.0–0.5)
Eosinophils Relative: 0 %
HCT: 39.3 % (ref 36.0–46.0)
Hemoglobin: 12.2 g/dL (ref 12.0–15.0)
Immature Granulocytes: 1 %
Lymphocytes Relative: 11 %
Lymphs Abs: 0.7 10*3/uL (ref 0.7–4.0)
MCH: 26.5 pg (ref 26.0–34.0)
MCHC: 31 g/dL (ref 30.0–36.0)
MCV: 85.2 fL (ref 80.0–100.0)
Monocytes Absolute: 0.6 10*3/uL (ref 0.1–1.0)
Monocytes Relative: 10 %
Neutro Abs: 5 10*3/uL (ref 1.7–7.7)
Neutrophils Relative %: 77 %
Platelets: 327 10*3/uL (ref 150–400)
RBC: 4.61 MIL/uL (ref 3.87–5.11)
RDW: 16.7 % — ABNORMAL HIGH (ref 11.5–15.5)
WBC: 6.4 10*3/uL (ref 4.0–10.5)
nRBC: 0 % (ref 0.0–0.2)

## 2019-05-17 LAB — COMPREHENSIVE METABOLIC PANEL
ALT: 15 U/L (ref 0–44)
AST: 29 U/L (ref 15–41)
Albumin: 3.8 g/dL (ref 3.5–5.0)
Alkaline Phosphatase: 134 U/L — ABNORMAL HIGH (ref 38–126)
Anion gap: 12 (ref 5–15)
BUN: 40 mg/dL — ABNORMAL HIGH (ref 8–23)
CO2: 22 mmol/L (ref 22–32)
Calcium: 9.6 mg/dL (ref 8.9–10.3)
Chloride: 106 mmol/L (ref 98–111)
Creatinine, Ser: 1.74 mg/dL — ABNORMAL HIGH (ref 0.44–1.00)
GFR calc Af Amer: 29 mL/min — ABNORMAL LOW (ref >60)
GFR calc non Af Amer: 25 mL/min — ABNORMAL LOW (ref >60)
Glucose, Bld: 91 mg/dL (ref 70–99)
Potassium: 3.7 mmol/L (ref 3.5–5.1)
Sodium: 140 mmol/L (ref 135–145)
Total Bilirubin: 1 mg/dL (ref 0.3–1.2)
Total Protein: 7.4 g/dL (ref 6.5–8.1)

## 2019-05-17 LAB — URINALYSIS, ROUTINE W REFLEX MICROSCOPIC
Bilirubin Urine: NEGATIVE
Glucose, UA: NEGATIVE mg/dL
Ketones, ur: NEGATIVE mg/dL
Nitrite: NEGATIVE
Protein, ur: 100 mg/dL — AB
Specific Gravity, Urine: 1.014 (ref 1.005–1.030)
WBC, UA: >50 WBC/hpf — ABNORMAL HIGH (ref 0–5)
pH: 6 (ref 5.0–8.0)

## 2019-05-17 LAB — TROPONIN I (HIGH SENSITIVITY)
Troponin I (High Sensitivity): 12 ng/L (ref <18)
Troponin I (High Sensitivity): 12 ng/L (ref <18)

## 2019-05-17 LAB — CK: Total CK: 79 U/L (ref 38–234)

## 2019-05-17 LAB — SARS CORONAVIRUS 2 BY RT PCR (HOSPITAL ORDER, PERFORMED IN ~~LOC~~ HOSPITAL LAB): SARS Coronavirus 2: NEGATIVE

## 2019-05-17 MED ORDER — SODIUM CHLORIDE 0.9% FLUSH
3.0000 mL | Freq: Two times a day (BID) | INTRAVENOUS | Status: DC
  Administered 2019-05-17 – 2019-05-20 (×6): 3 mL via INTRAVENOUS

## 2019-05-17 MED ORDER — HEPARIN SODIUM (PORCINE) 5000 UNIT/ML IJ SOLN
5000.0000 [IU] | Freq: Three times a day (TID) | INTRAMUSCULAR | Status: DC
  Administered 2019-05-17 – 2019-05-21 (×11): 5000 [IU] via SUBCUTANEOUS
  Filled 2019-05-17 (×11): qty 1

## 2019-05-17 MED ORDER — DILTIAZEM HCL 25 MG/5ML IV SOLN
5.0000 mg | Freq: Once | INTRAVENOUS | Status: AC
  Administered 2019-05-17: 5 mg via INTRAVENOUS
  Filled 2019-05-17: qty 5

## 2019-05-17 MED ORDER — DILTIAZEM HCL 30 MG PO TABS
30.0000 mg | ORAL_TABLET | Freq: Two times a day (BID) | ORAL | Status: DC
  Administered 2019-05-17: 30 mg via ORAL
  Filled 2019-05-17: qty 1

## 2019-05-17 MED ORDER — POTASSIUM CHLORIDE CRYS ER 10 MEQ PO TBCR
10.0000 meq | EXTENDED_RELEASE_TABLET | Freq: Every day | ORAL | Status: DC
  Administered 2019-05-18 – 2019-05-21 (×4): 10 meq via ORAL
  Filled 2019-05-17 (×5): qty 1

## 2019-05-17 MED ORDER — ASPIRIN 81 MG PO CHEW
81.0000 mg | CHEWABLE_TABLET | Freq: Every day | ORAL | Status: DC
  Administered 2019-05-18 – 2019-05-21 (×4): 81 mg via ORAL
  Filled 2019-05-17 (×4): qty 1

## 2019-05-17 MED ORDER — SODIUM CHLORIDE 0.9% FLUSH: 3.0000 mL | INTRAVENOUS | Status: DC | PRN

## 2019-05-17 MED ORDER — NITROGLYCERIN 0.4 MG SL SUBL: 0.4000 mg | SUBLINGUAL_TABLET | SUBLINGUAL | Status: DC | PRN

## 2019-05-17 MED ORDER — SODIUM CHLORIDE 0.9 % IV SOLN: 250.0000 mL | INTRAVENOUS | Status: DC | PRN

## 2019-05-17 MED ORDER — METOPROLOL TARTRATE 25 MG PO TABS
25.0000 mg | ORAL_TABLET | Freq: Two times a day (BID) | ORAL | Status: DC
  Administered 2019-05-17 – 2019-05-21 (×8): 25 mg via ORAL
  Filled 2019-05-17 (×8): qty 1

## 2019-05-17 MED ORDER — ACETAMINOPHEN 325 MG PO TABS
650.0000 mg | ORAL_TABLET | Freq: Four times a day (QID) | ORAL | Status: DC | PRN
  Administered 2019-05-20: 650 mg via ORAL
  Filled 2019-05-17: qty 2

## 2019-05-17 NOTE — ED Notes (Signed)
ED TO INPATIENT HANDOFF REPORT  Name/Age/Gender Shannon Cruz 85 y.o. female  Code Status    Code Status Orders  (From admission, onward)         Start     Ordered   05/17/19 1952  Do not attempt resuscitation (DNR)  Continuous    Question Answer Comment  In the event of cardiac or respiratory ARREST Do not call a "code blue"   In the event of cardiac or respiratory ARREST Do not perform Intubation, CPR, defibrillation or ACLS   In the event of cardiac or respiratory ARREST Use medication by any route, position, wound care, and other measures to relive pain and suffering. May use oxygen, suction and manual treatment of airway obstruction as needed for comfort.      05/17/19 1954        Code Status History    Date Active Date Inactive Code Status Order ID Comments User Context   12/20/2018 1831 12/23/2018 1639 Full Code [id number]  Elodia Florence., MD Inpatient   06/24/2017 1837 06/26/2017 2157 Partial Code [id number]  Dixie Dials, MD Inpatient   Advance Care Planning Activity      Home/SNF/Other Home  Chief Complaint Leg Pain  Level of Care/Admitting Diagnosis ED Disposition    ED Disposition Condition [place]: [facility] [[id number]]  Level of Care: Telemetry [5]  Admit to tele based on following criteria: Complex arrhythmia (Bradycardia/Tachycardia)  Covid Evaluation: Confirmed COVID Negative  Diagnosis: Atrial fibrillation with RVR [facility]) [[id number]]  Admitting Physician: Dixie Dials [[id number]]  Attending Physician: Dixie Dials [[id number]]  PT Class (Do Not Modify): Observation [104]  PT Acc Code (Do Not Modify): Observation [10022]       Medical History Past Medical History:  Diagnosis Date  . Arthritis   . H/O diastolic dysfunction   . Heart palpitations   . Hypertension   . RBBB (right bundle branch block) 11/20/2010   Echo - EF >55%; flow pattern suggestive of impaired LV relaxation, proximal septal thickening  noted; mitral valve mildly thickened; mild mitral annular calcification; aortic root sclerosis/calcification    Allergies No Known Allergies  IV Location/Drains/Wounds Patient Lines/Drains/Airways Status   Active Line/Drains/Airways    Name:   Placement date:   Placement time:   Site:   Days:   Peripheral IV 05/17/19 Right Forearm   05/17/19    1655    Forearm   less than 1          Labs/Imaging Results for orders placed or performed during the hospital encounter of 05/17/19 (from the past 48 hour(s))  CBC with Differential/Platelet     Status: Abnormal   Collection Time: 05/17/19  4:41 PM  Result Value Ref Range   WBC 6.4 4.0 - 10.5 K/uL   RBC 4.61 3.87 - 5.11 MIL/uL   Hemoglobin 12.2 12.0 - 15.0 g/dL   HCT 39.3 36.0 - 46.0 %   MCV 85.2 80.0 - 100.0 fL   MCH 26.5 26.0 - 34.0 pg   MCHC 31.0 30.0 - 36.0 g/dL   RDW 16.7 (H) 11.5 - 15.5 %   Platelets 327 150 - 400 K/uL   nRBC 0.0 0.0 - 0.2 %   Neutrophils Relative % 77 %   Neutro Abs 5.0 1.7 - 7.7 K/uL   Lymphocytes Relative 11 %   Lymphs Abs 0.7 0.7 - 4.0 K/uL   Monocytes Relative 10 %   Monocytes Absolute 0.6 0.1 - 1.0 K/uL  Eosinophils Relative 0 %   Eosinophils Absolute 0.0 0.0 - 0.5 K/uL   Basophils Relative 1 %   Basophils Absolute 0.1 0.0 - 0.1 K/uL   Immature Granulocytes 1 %   Abs Immature Granulocytes 0.03 0.00 - 0.07 K/uL    Comment: Performed at [facility], [place] [address], [place], [place] [address]  Comprehensive metabolic panel     Status: Abnormal   Collection Time: 05/17/19  4:41 PM  Result Value Ref Range   Sodium 140 135 - 145 mmol/L   Potassium 3.7 3.5 - 5.1 mmol/L   Chloride 106 98 - 111 mmol/L   CO2 22 22 - 32 mmol/L   Glucose, Bld 91 70 - 99 mg/dL   BUN 40 (H) 8 - 23 mg/dL   Creatinine, Ser 1.74 (H) 0.44 - 1.00 mg/dL   Calcium 9.6 8.9 - 10.3 mg/dL   Total Protein 7.4 6.5 - 8.1 g/dL   Albumin 3.8 3.5 - 5.0 g/dL   AST 29 15 - 41 U/L   ALT 15 0 - 44 U/L   Alkaline  Phosphatase 134 (H) 38 - 126 U/L   Total Bilirubin 1.0 0.3 - 1.2 mg/dL   GFR calc non Af Amer 25 (L) >60 mL/min   GFR calc Af Amer 29 (L) >60 mL/min   Anion gap 12 5 - 15    Comment: Performed at [facility], [place], [place] [address], [place], [place] [address]  Troponin I (High Sensitivity)     Status: None   Collection Time: 05/17/19  4:41 PM  Result Value Ref Range   Troponin I (High Sensitivity) 12 <18 ng/L    Comment: (NOTE) Elevated high sensitivity troponin I (hsTnI) values and significant  changes across serial measurements may suggest ACS but many other  chronic and acute conditions are known to elevate hsTnI results.  Refer to the "Links" section for chest pain algorithms and additional  guidance. Performed at [facility], [place] [address] [place], [place] [address]   CK     Status: None   Collection Time: 05/17/19  4:41 PM  Result Value Ref Range   Total CK 79 38 - 234 U/L    Comment: Performed at [facility], [place] [address], [place], [place] [address]  Urinalysis, Routine w reflex microscopic     Status: Abnormal   Collection Time: 05/17/19  6:52 PM  Result Value Ref Range   Color, Urine YELLOW YELLOW   APPearance HAZY (A) CLEAR   Specific Gravity, Urine 1.014 1.005 - 1.030   pH 6.0 5.0 - 8.0   Glucose, UA NEGATIVE NEGATIVE mg/dL   Hgb urine dipstick SMALL (A) NEGATIVE   Bilirubin Urine NEGATIVE NEGATIVE   Ketones, ur NEGATIVE NEGATIVE mg/dL   Protein, ur 100 (A) NEGATIVE mg/dL   Nitrite NEGATIVE NEGATIVE   Leukocytes,Ua LARGE (A) NEGATIVE   RBC / HPF 11-20 0 - 5 RBC/hpf   WBC, UA >50 (H) 0 - 5 WBC/hpf   Bacteria, UA RARE (A) NONE SEEN   Squamous Epithelial / LPF 0-5 0 - 5   Mucus PRESENT     Comment: Performed at [facility], [place] [address] [place], [place] [address]  Troponin I (High Sensitivity)     Status: None   Collection Time: 05/17/19  6:52 PM  Result Value Ref Range   Troponin I  (High Sensitivity) 12.0 <18 ng/L    Comment: (NOTE) Elevated high sensitivity troponin I (hsTnI)  values and significant  changes across serial measurements may suggest ACS but many other  chronic and acute conditions are known to elevate hsTnI results.  Refer to the "Links" section for chest pain algorithms and additional  guidance. Performed at [facility], [place] [address], [place], [place] [address]   SARS Coronavirus 2 (CEPHEID - Performed in [place] hospital lab), Hosp Order     Status: None   Collection Time: 05/17/19  6:52 PM   Specimen: Nasopharyngeal Swab  Result Value Ref Range   SARS Coronavirus 2 NEGATIVE NEGATIVE    Comment: (NOTE) If result is NEGATIVE SARS-CoV-2 target nucleic acids are NOT DETECTED. The SARS-CoV-2 RNA is generally detectable in upper and lower  respiratory specimens during the acute phase of infection. The lowest  concentration of SARS-CoV-2 viral copies this assay can detect is 250  copies / mL. A negative result does not preclude SARS-CoV-2 infection  and should not be used as the sole basis for treatment or other  patient management decisions.  A negative result may occur with  improper specimen collection / handling, submission of specimen other  than nasopharyngeal swab, presence of viral mutation(s) within the  areas targeted by this assay, and inadequate number of viral copies  (<250 copies / mL). A negative result must be combined with clinical  observations, patient history, and epidemiological information. If result is POSITIVE SARS-CoV-2 target nucleic acids are DETECTED. The SARS-CoV-2 RNA is generally detectable in upper and lower  respiratory specimens dur ing the acute phase of infection.  Positive  results are indicative of active infection with SARS-CoV-2.  Clinical  correlation with patient history and other diagnostic information is  necessary to determine patient infection status.  Positive results do  not  rule out bacterial infection or co-infection with other viruses. If result is PRESUMPTIVE POSTIVE SARS-CoV-2 nucleic acids MAY BE PRESENT.   A presumptive positive result was obtained on the submitted specimen  and confirmed on repeat testing.  While 2019 novel coronavirus  (SARS-CoV-2) nucleic acids may be present in the submitted sample  additional confirmatory testing may be necessary for epidemiological  and / or clinical management purposes  to differentiate between  SARS-CoV-2 and other Sarbecovirus currently known to infect humans.  If clinically indicated additional testing with an alternate test  methodology [phone]) is advised. The SARS-CoV-2 RNA is generally  detectable in upper and lower respiratory sp ecimens during the acute  phase of infection. The expected result is Negative. Fact Sheet for Patients:  StrictlyIdeas.no Fact Sheet for Healthcare Providers: BankingDealers.co.za This test is not yet approved or cleared by the [place] FDA and has been authorized for detection and/or diagnosis of SARS-CoV-2 by FDA under an Emergency Use Authorization (EUA).  This EUA will remain in effect (meaning this test can be used) for the duration of the COVID-19 declaration under Section 564(b)(1) of the Act, 21 U.S.C. section 360bbb-3(b)(1), unless the authorization is terminated or revoked sooner. Performed at [facility], [place] [address] [place], [place] [address]    Dg Chest 1 View  Result Date: 05/17/2019 CLINICAL DATA:  Fall EXAM: CHEST  1 VIEW COMPARISON:  12/20/2018 FINDINGS: Moderate cardiomegaly. Clear lungs. No pleural effusion or pneumothorax. IMPRESSION: Moderate cardiomegaly, unchanged.  No acute airspace disease. Electronically Signed   By: Ulyses Jarred M.D.   On: 05/17/2019 17:40   Ct Head Wo Contrast  Result Date: 05/17/2019 CLINICAL DATA:  Recent fall EXAM: CT HEAD WITHOUT CONTRAST CT  CERVICAL SPINE WITHOUT CONTRAST TECHNIQUE: Multidetector CT imaging of the head and cervical spine was performed following the standard protocol without intravenous contrast. Multiplanar CT image reconstructions of the cervical spine were also generated. COMPARISON:  11/30/2017 FINDINGS: CT HEAD FINDINGS Brain: Mild atrophic changes and chronic white matter ischemic changes are seen. No findings to suggest acute hemorrhage, acute infarction or space-occupying mass lesion are noted. Vascular: No hyperdense vessel or unexpected calcification. Skull: Normal. Negative for fracture or focal lesion. Sinuses/Orbits: No acute finding. Other: None. CT CERVICAL SPINE FINDINGS Alignment: Within normal limits. Skull base and vertebrae: 7 cervical segments are well visualized. Vertebral body height is well maintained. Disc space narrowing is noted throughout the cervical spine with evidence of osteophytic change and facet hypertrophic change. No acute fracture or acute facet abnormality is noted. Soft tissues and spinal canal: Surrounding soft tissue structures show vascular calcifications of the carotid arteries. No other soft tissue abnormality is seen. Upper chest: Visualized lung apices are within normal limits. Other: None IMPRESSION: CT of the head: No acute intracranial abnormality is noted. Chronic atrophic and ischemic changes are again noted and stable. CT of the cervical spine: Multilevel degenerative change without acute abnormality. Electronically Signed   By: Inez Catalina M.D.   On: 05/17/2019 17:32   Ct Cervical Spine Wo Contrast  Result Date: 05/17/2019 CLINICAL DATA:  Recent fall EXAM: CT HEAD WITHOUT CONTRAST CT CERVICAL SPINE WITHOUT CONTRAST TECHNIQUE: Multidetector CT imaging of the head and cervical spine was performed following the standard protocol without intravenous contrast. Multiplanar CT image reconstructions of the cervical spine were also generated. COMPARISON:  11/30/2017 FINDINGS: CT HEAD  FINDINGS Brain: Mild atrophic changes and chronic white matter ischemic changes are seen. No findings to suggest acute hemorrhage, acute infarction or space-occupying mass lesion are noted. Vascular: No hyperdense vessel or unexpected calcification. Skull: Normal. Negative for fracture or focal lesion. Sinuses/Orbits: No acute finding. Other: None. CT CERVICAL SPINE FINDINGS Alignment: Within normal limits. Skull base and vertebrae: 7 cervical segments are well visualized. Vertebral body height is well maintained. Disc space narrowing is noted throughout the cervical spine with evidence of osteophytic change and facet hypertrophic change. No acute fracture or acute facet abnormality is noted. Soft tissues and spinal canal: Surrounding soft tissue structures show vascular calcifications of the carotid arteries. No other soft tissue abnormality is seen. Upper chest: Visualized lung apices are within normal limits. Other: None IMPRESSION: CT of the head: No acute intracranial abnormality is noted. Chronic atrophic and ischemic changes are again noted and stable. CT of the cervical spine: Multilevel degenerative change without acute abnormality. Electronically Signed   By: Inez Catalina M.D.   On: 05/17/2019 17:32   Ct Knee Right Wo Contrast  Result Date: 05/17/2019 CLINICAL DATA:  Right knee injury in a fall 05/15/2019. Initial encounter. EXAM: CT OF THE RIGHT KNEE WITHOUT CONTRAST TECHNIQUE: Multidetector CT imaging of the right knee was performed according to the standard protocol. Multiplanar CT image reconstructions were also generated. COMPARISON:  None. FINDINGS: Bones/Joint/Cartilage Bones are osteopenic. No acute bony or joint abnormality is identified. Depression of the medial tibial plateau is likely due to remote insufficiency injury. The patient has severe tricompartmental osteoarthritis with bulky osteophytes present about the joint and innumerable loose bodies present. Moderate joint effusion is noted.  Ligaments Suboptimally assessed by CT. Muscles and Tendons Intact. Soft tissues Mild subcutaneous edema is present about the knee. IMPRESSION: No acute abnormality. Severe tricompartmental osteoarthritis. Depression of the medial tibial plateau is  most compatible with remote insufficiency injury. Osteopenia. Electronically Signed   By: Inge Rise M.D.   On: 05/17/2019 17:24   Dg Knee Complete 4 Views Left  Result Date: 05/17/2019 CLINICAL DATA:  Right knee pain since a fall 05/15/2019. EXAM: LEFT KNEE - COMPLETE 4+ VIEW COMPARISON:  CT right knee this same day. FINDINGS: There is no acute bony or joint abnormality. Mild depression of the medial plateau is most consistent with prior stress injury and similar to changes seen on CT scan of the right knee today. The patient has severe tricompartmental osteoarthritis. Extensive chondrocalcinosis is present about the knee. Joint effusion and multiple loose bodies in the joint noted. IMPRESSION: No acute finding. Mild depression of the medial tibial plateau is similar to that seen on CT scan of the right knee this same day and most consistent with remote insufficiency injury. Severe tricompartmental osteoarthritis. Electronically Signed   By: Inge Rise M.D.   On: 05/17/2019 17:40   Dg Hip Unilat W Or Wo Pelvis 2-3 Views Right  Result Date: 05/17/2019 CLINICAL DATA:  Right hip and leg pain since a fall 05/15/2019. Initial encounter. EXAM: DG HIP (WITH OR WITHOUT PELVIS) 2-3V RIGHT COMPARISON:  Plain films right hip 11/29/2017. FINDINGS: No acute bony or joint abnormality is identified. Severe right hip osteoarthritis with bone-on-bone joint space narrowing, subchondral sclerosis and chondrocalcinosis is unchanged in appearance. IMPRESSION: No acute abnormality. No change in severe right hip osteoarthritis. Electronically Signed   By: Inge Rise M.D.   On: 05/17/2019 17:38    Pending Labs Unresulted Labs (From admission, onward)    Start      Ordered   05/18/19 8469  Basic metabolic panel  Tomorrow morning,   R     05/17/19 1954   05/18/19 0500  CBC  Tomorrow morning,   R     05/17/19 1954          Vitals/Pain Today's Vitals   05/17/19 1633 05/17/19 1748 05/17/19 1755 05/17/19 1930  BP:  (!) 156/90 (!) 141/79 (!) 159/107  Pulse:   88 87  Resp:  20 (!) 22 16  Temp:      TempSrc:      SpO2:   100% 97%  PainSc: 6        Isolation Precautions No active isolations  Medications Medications  heparin injection 5,000 Units (has no administration in time range)  sodium chloride flush (NS) 0.9 % injection 3 mL (has no administration in time range)  sodium chloride flush (NS) 0.9 % injection 3 mL (has no administration in time range)  0.9 %  sodium chloride infusion (has no administration in time range)  acetaminophen (TYLENOL) tablet 650 mg (has no administration in time range)  aspirin chewable tablet 81 mg (has no administration in time range)  diltiazem (CARDIZEM) tablet 30 mg (has no administration in time range)  metoprolol tartrate (LOPRESSOR) tablet 25 mg (has no administration in time range)  nitroGLYCERIN (NITROSTAT) SL tablet 0.4 mg (has no administration in time range)  potassium chloride (K-DUR) CR tablet 10 mEq (has no administration in time range)  diltiazem (CARDIZEM) injection 5 mg (5 mg Intravenous Given 05/17/19 1749)    Mobility walks with device

## 2019-05-17 NOTE — ED Notes (Signed)
5 mg Cardizem pushed slow, VS before and after charted

## 2019-05-17 NOTE — H&P (Signed)
Referring Physician:   Jacklin Zwick is an 85 y.o. female.                       Chief Complaint: Leg pain and fall  HPI: 85 years old black female with PMH of Chronic atrial flutter with variable AV block, hypertension and degenerative joint disease had a fall 3 to 4 days ago resulting in head, neck, hip and right knee injury. CT of head is unremarkable. CT of right knee shows severe tricompartmental osteoarthritis, medical tibial plateau depression and osteopenia. Chest x-ray shows moderate cardiomegaly. X-ray of hip shows severe right hip osteoarthritis. EKG shows atrial fibrillation with RVR, LAHB, RBBB and old IWMI.  Past Medical History:  Diagnosis Date  . Arthritis   . H/O diastolic dysfunction   . Heart palpitations   . Hypertension   . RBBB (right bundle branch block) 11/20/2010   Echo - EF >55%; flow pattern suggestive of impaired LV relaxation, proximal septal thickening noted; mitral valve mildly thickened; mild mitral annular calcification; aortic root sclerosis/calcification      History reviewed. No pertinent surgical history.  No family history on file. Social History:  reports that she quit smoking about 36 years ago. She has never used smokeless tobacco. She reports that she does not drink alcohol or use drugs.  Allergies: No Known Allergies  (Not in a hospital admission)   Results for orders placed or performed during the hospital encounter of 05/17/19 (from the past 48 hour(s))  CBC with Differential/Platelet     Status: Abnormal   Collection Time: 05/17/19  4:41 PM  Result Value Ref Range   WBC 6.4 4.0 - 10.5 K/uL   RBC 4.61 3.87 - 5.11 MIL/uL   Hemoglobin 12.2 12.0 - 15.0 g/dL   HCT 39.3 36.0 - 46.0 %   MCV 85.2 80.0 - 100.0 fL   MCH 26.5 26.0 - 34.0 pg   MCHC 31.0 30.0 - 36.0 g/dL   RDW 16.7 (H) 11.5 - 15.5 %   Platelets 327 150 - 400 K/uL   nRBC 0.0 0.0 - 0.2 %   Neutrophils Relative % 77 %   Neutro Abs 5.0 1.7 - 7.7 K/uL   Lymphocytes Relative  11 %   Lymphs Abs 0.7 0.7 - 4.0 K/uL   Monocytes Relative 10 %   Monocytes Absolute 0.6 0.1 - 1.0 K/uL   Eosinophils Relative 0 %   Eosinophils Absolute 0.0 0.0 - 0.5 K/uL   Basophils Relative 1 %   Basophils Absolute 0.1 0.0 - 0.1 K/uL   Immature Granulocytes 1 %   Abs Immature Granulocytes 0.03 0.00 - 0.07 K/uL    Comment: Performed at [facility], [place] [address], [place], [place] [address]  Comprehensive metabolic panel     Status: Abnormal   Collection Time: 05/17/19  4:41 PM  Result Value Ref Range   Sodium 140 135 - 145 mmol/L   Potassium 3.7 3.5 - 5.1 mmol/L   Chloride 106 98 - 111 mmol/L   CO2 22 22 - 32 mmol/L   Glucose, Bld 91 70 - 99 mg/dL   BUN 40 (H) 8 - 23 mg/dL   Creatinine, Ser 1.74 (H) 0.44 - 1.00 mg/dL   Calcium 9.6 8.9 - 10.3 mg/dL   Total Protein 7.4 6.5 - 8.1 g/dL   Albumin 3.8 3.5 - 5.0 g/dL   AST 29 15 - 41 U/L   ALT 15 0 - 44 U/L   Alkaline  Phosphatase 134 (H) 38 - 126 U/L   Total Bilirubin 1.0 0.3 - 1.2 mg/dL   GFR calc non Af Amer 25 (L) >60 mL/min   GFR calc Af Amer 29 (L) >60 mL/min   Anion gap 12 5 - 15    Comment: Performed at [facility], [place] [address] [place], [place] [address]  Troponin I (High Sensitivity)     Status: None   Collection Time: 05/17/19  4:41 PM  Result Value Ref Range   Troponin I (High Sensitivity) 12 <18 ng/L    Comment: (NOTE) Elevated high sensitivity troponin I (hsTnI) values and significant  changes across serial measurements may suggest ACS but many other  chronic and acute conditions are known to elevate hsTnI results.  Refer to the "Links" section for chest pain algorithms and additional  guidance. Performed at [facility], [place] [address] [place], [place] [address]   CK     Status: None   Collection Time: 05/17/19  4:41 PM  Result Value Ref Range   Total CK 79 38 - 234 U/L    Comment: Performed at [facility], [place] [address] [place], [place] [address]   Dg Chest 1 View  Result Date: 05/17/2019 CLINICAL DATA:  Fall EXAM: CHEST  1 VIEW COMPARISON:  12/20/2018 FINDINGS: Moderate cardiomegaly. Clear lungs. No pleural effusion or pneumothorax. IMPRESSION: Moderate cardiomegaly, unchanged.  No acute airspace disease. Electronically Signed   By: Ulyses Jarred M.D.   On: 05/17/2019 17:40   Ct Head Wo Contrast  Result Date: 05/17/2019 CLINICAL DATA:  Recent fall EXAM: CT HEAD WITHOUT CONTRAST CT CERVICAL SPINE WITHOUT CONTRAST TECHNIQUE: Multidetector CT imaging of the head and cervical spine was performed following the standard protocol without intravenous contrast. Multiplanar CT image reconstructions of the cervical spine were also generated. COMPARISON:  11/30/2017 FINDINGS: CT HEAD FINDINGS Brain: Mild atrophic changes and chronic white matter ischemic changes are seen. No findings to suggest acute hemorrhage, acute infarction or space-occupying mass lesion are noted. Vascular: No hyperdense vessel or unexpected calcification. Skull: Normal. Negative for fracture or focal lesion. Sinuses/Orbits: No acute finding. Other: None. CT CERVICAL SPINE FINDINGS Alignment: Within normal limits. Skull base and vertebrae: 7 cervical segments are well visualized. Vertebral body height is well maintained. Disc space narrowing is noted throughout the cervical spine with evidence of osteophytic change and facet hypertrophic change. No acute fracture or acute facet abnormality is noted. Soft tissues and spinal canal: Surrounding soft tissue structures show vascular calcifications of the carotid arteries. No other soft tissue abnormality is seen. Upper chest: Visualized lung apices are within normal limits. Other: None IMPRESSION: CT of the head: No acute intracranial abnormality is noted. Chronic atrophic and ischemic changes are again noted and stable. CT of the cervical spine: Multilevel degenerative change without acute abnormality.  Electronically Signed   By: Inez Catalina M.D.   On: 05/17/2019 17:32   Ct Cervical Spine Wo Contrast  Result Date: 05/17/2019 CLINICAL DATA:  Recent fall EXAM: CT HEAD WITHOUT CONTRAST CT CERVICAL SPINE WITHOUT CONTRAST TECHNIQUE: Multidetector CT imaging of the head and cervical spine was performed following the standard protocol without intravenous contrast. Multiplanar CT image reconstructions of the cervical spine were also generated. COMPARISON:  11/30/2017 FINDINGS: CT HEAD FINDINGS Brain: Mild atrophic changes and chronic white matter ischemic changes are seen. No findings to suggest acute hemorrhage, acute infarction or space-occupying mass lesion are noted. Vascular: No hyperdense vessel or unexpected calcification. Skull:  Normal. Negative for fracture or focal lesion. Sinuses/Orbits: No acute finding. Other: None. CT CERVICAL SPINE FINDINGS Alignment: Within normal limits. Skull base and vertebrae: 7 cervical segments are well visualized. Vertebral body height is well maintained. Disc space narrowing is noted throughout the cervical spine with evidence of osteophytic change and facet hypertrophic change. No acute fracture or acute facet abnormality is noted. Soft tissues and spinal canal: Surrounding soft tissue structures show vascular calcifications of the carotid arteries. No other soft tissue abnormality is seen. Upper chest: Visualized lung apices are within normal limits. Other: None IMPRESSION: CT of the head: No acute intracranial abnormality is noted. Chronic atrophic and ischemic changes are again noted and stable. CT of the cervical spine: Multilevel degenerative change without acute abnormality. Electronically Signed   By: Inez Catalina M.D.   On: 05/17/2019 17:32   Ct Knee Right Wo Contrast  Result Date: 05/17/2019 CLINICAL DATA:  Right knee injury in a fall 05/15/2019. Initial encounter. EXAM: CT OF THE RIGHT KNEE WITHOUT CONTRAST TECHNIQUE: Multidetector CT imaging of the right knee  was performed according to the standard protocol. Multiplanar CT image reconstructions were also generated. COMPARISON:  None. FINDINGS: Bones/Joint/Cartilage Bones are osteopenic. No acute bony or joint abnormality is identified. Depression of the medial tibial plateau is likely due to remote insufficiency injury. The patient has severe tricompartmental osteoarthritis with bulky osteophytes present about the joint and innumerable loose bodies present. Moderate joint effusion is noted. Ligaments Suboptimally assessed by CT. Muscles and Tendons Intact. Soft tissues Mild subcutaneous edema is present about the knee. IMPRESSION: No acute abnormality. Severe tricompartmental osteoarthritis. Depression of the medial tibial plateau is most compatible with remote insufficiency injury. Osteopenia. Electronically Signed   By: Inge Rise M.D.   On: 05/17/2019 17:24   Dg Knee Complete 4 Views Left  Result Date: 05/17/2019 CLINICAL DATA:  Right knee pain since a fall 05/15/2019. EXAM: LEFT KNEE - COMPLETE 4+ VIEW COMPARISON:  CT right knee this same day. FINDINGS: There is no acute bony or joint abnormality. Mild depression of the medial plateau is most consistent with prior stress injury and similar to changes seen on CT scan of the right knee today. The patient has severe tricompartmental osteoarthritis. Extensive chondrocalcinosis is present about the knee. Joint effusion and multiple loose bodies in the joint noted. IMPRESSION: No acute finding. Mild depression of the medial tibial plateau is similar to that seen on CT scan of the right knee this same day and most consistent with remote insufficiency injury. Severe tricompartmental osteoarthritis. Electronically Signed   By: Inge Rise M.D.   On: 05/17/2019 17:40   Dg Hip Unilat W Or Wo Pelvis 2-3 Views Right  Result Date: 05/17/2019 CLINICAL DATA:  Right hip and leg pain since a fall 05/15/2019. Initial encounter. EXAM: DG HIP (WITH OR WITHOUT PELVIS)  2-3V RIGHT COMPARISON:  Plain films right hip 11/29/2017. FINDINGS: No acute bony or joint abnormality is identified. Severe right hip osteoarthritis with bone-on-bone joint space narrowing, subchondral sclerosis and chondrocalcinosis is unchanged in appearance. IMPRESSION: No acute abnormality. No change in severe right hip osteoarthritis. Electronically Signed   By: Inge Rise M.D.   On: 05/17/2019 17:38    Review Of Systems Constitutional: No fever, chills, weight loss or gain. Eyes: No vision change, wears glasses. No discharge or pain. Ears: No hearing loss, No tinnitus. Respiratory: No asthma, COPD, pneumonias. No shortness of breath. No hemoptysis. Cardiovascular: No chest pain, occasional palpitation and leg edema. Gastrointestinal: No nausea,  vomiting, diarrhea, constipation. No GI bleed. No hepatitis. Genitourinary: No dysuria, hematuria, kidney stone. No incontinance. Neurological: No headache, stroke, seizures.  Psychiatry: No psych facility admission for anxiety, depression, suicide. No detox. Skin: No rash. Musculoskeletal: Positive hip and knee joint pain, fibromyalgia, neck pain, back pain. Lymphadenopathy: No lymphadenopathy. Hematology: No anemia or easy bruising.   Blood pressure (!) 141/79, pulse 88, temperature 98.3 F (36.8 C), temperature source Oral, resp. rate (!) 22, SpO2 100 %. There is no height or weight on file to calculate BMI. General appearance: alert, cooperative, appears stated age and no distress Head: Normocephalic, atraumatic. Eyes: Brown eyes, pink conjunctiva, corneas clear. Neck: No adenopathy, no carotid bruit, no JVD, supple, symmetrical, trachea midline and thyroid not enlarged. Resp: Clear to auscultation bilaterally. Cardio: Irregular rate and rhythm, S1, S2 normal, III/VI systolic murmur, no click, rub or gallop GI: Soft, non-tender; bowel sounds normal; no organomegaly. Extremities: No edema, cyanosis or clubbing. Right hip, knee, back  and neck tenderness on palpation. Skin: Warm and dry.  Neurologic: Alert and oriented X 3, normal strength.   Assessment/Plan Atrial fibrillation with RVR, [id number] score of 4 Hypertension Right hip pain with severe osteoarthritis Right knee pain with severe osteoarthritis Severe multilevel cervical disc disease  Resume metoprolol and diltiazem for HR control. Monitor for 24-48 hours. Consider SNF placement if son and patient agree. Not a candidate for anticoagulation due to unsteady gait.  Time spent: Review of old records, Lab, x-rays, EKG, other cardiac tests, examination, discussion with patient over 70 minutes.  Birdie Riddle, MD  05/17/2019, 8:00 PM

## 2019-05-17 NOTE — ED Triage Notes (Signed)
Pt had a fall on Friday and has bruises on her arms and reports right hip and leg pain. Pt reportedly lives by herself and her family replaced her mattress and left the plastic on the bed and she slid out of bed again and hit her walker.

## 2019-05-17 NOTE — ED Provider Notes (Signed)
[facility] DEPT Provider Note   CSN: [id number] Arrival date & time: 05/17/19  1621     History   Chief Complaint Chief Complaint  Patient presents with   Leg Pain   Fall    HPI Alisan Dokes is a 85 y.o. female hx of aflutter, HTN, who presented with fall, weakness.  Patient has not been eating and drinking much for the last several days.  About a week ago, patient states that her son bought her a new mattress and the plastic cover is still on.  She states that about 3 to 4 days ago, she slipped and fell and landed on her head and right knee.  She states that she has been having more pain and had trouble walking for the last several days.  Patient was admitted for V. tach several months ago.  Patient has no chest pain or shortness of breath currently.      The history is provided by the patient.    Past Medical History:  Diagnosis Date   Arthritis    H/O diastolic dysfunction    Heart palpitations    Hypertension    RBBB (right bundle branch block) 11/20/2010   Echo - EF >55%; flow pattern suggestive of impaired LV relaxation, proximal septal thickening noted; mitral valve mildly thickened; mild mitral annular calcification; aortic root sclerosis/calcification    Patient Active Problem List   Diagnosis Date Noted   Atrial flutter ([place]) 12/20/2018   Bilateral leg edema 06/24/2017   Abdominal pain 06/24/2017   Atrial bigeminy 10/09/2013   HTN (hypertension) 03/19/2013   RBBB (right bundle branch block) 03/19/2013   Left shoulder pain 03/19/2013    History reviewed. No pertinent surgical history.   OB History    Gravida      Para      Term      Preterm      AB      Living  3     SAB      TAB      Ectopic      Multiple      Live Births               Home Medications    Prior to Admission medications   Medication Sig Start Date End Date Taking? Authorizing Provider  acetaminophen (TYLENOL) 325  MG tablet Take 2 tablets (650 mg total) by mouth every 6 (six) hours as needed for mild pain (or Fever >/= 101). 12/23/18   Hosie Poisson, MD  aspirin 81 MG chewable tablet Chew 81 mg by mouth daily.    [provider]  diltiazem (CARDIZEM) 30 MG tablet Take 1 tablet (30 mg total) by mouth every 12 (twelve) hours. 12/23/18   Hosie Poisson, MD  furosemide (LASIX) 20 MG tablet Take 1 tablet (20 mg total) by mouth daily. 06/27/17   Dixie Dials, MD  metoprolol tartrate (LOPRESSOR) 25 MG tablet Take 1 tablet (25 mg total) by mouth 2 (two) times daily. 06/26/17   Dixie Dials, MD  nitroGLYCERIN (NITROSTAT) 0.4 MG SL tablet Place 0.4 mg under the tongue every 5 (five) minutes as needed for chest pain.    [provider]  potassium chloride (K-DUR,KLOR-CON) 10 MEQ tablet Take 1 tablet (10 mEq total) by mouth daily. 06/26/17   Dixie Dials, MD    Family History No family history on file.  Social History Social History   Tobacco Use   Smoking status: Former Smoker  Quit date: 11/05/1982    Years since quitting: 36.5   Smokeless tobacco: Never Used  Substance Use Topics   Alcohol use: No   Drug use: No     Allergies   Patient has no known allergies.   Review of Systems Review of Systems  Musculoskeletal:       Bilateral knee pain   Neurological: Positive for weakness.  All other systems reviewed and are negative.    Physical Exam Updated Vital Signs BP (!) 141/79    Pulse 88    Temp 98.3 F (36.8 C) (Oral)    Resp (!) 22    SpO2 100%   Physical Exam Vitals signs and nursing note reviewed.  HENT:     Head: Normocephalic.     Comments: No obvious scalp hematoma     Nose: Nose normal.     Mouth/Throat:     Mouth: Mucous membranes are moist.  Eyes:     Extraocular Movements: Extraocular movements intact.     Pupils: Pupils are equal, round, and reactive to light.  Neck:     Musculoskeletal: Normal range of motion.  Cardiovascular:     Rate and Rhythm:  Normal rate.     Pulses: Normal pulses.     Heart sounds: Normal heart sounds.  Pulmonary:     Effort: Pulmonary effort is normal.     Breath sounds: Normal breath sounds.  Abdominal:     General: Abdomen is flat.     Palpations: Abdomen is soft.  Musculoskeletal:     Comments: Bilateral knees with large effusions, worse on R side. Bruising on R knee, nl ROM bilateral hips. No spinal tenderness. No saddle anesthesia   Skin:    General: Skin is warm.     Capillary Refill: Capillary refill takes less than 2 seconds.  Neurological:     General: No focal deficit present.     Mental Status: She is alert and oriented to person, place, and time.  Psychiatric:        Mood and Affect: Mood normal.        Behavior: Behavior normal.      ED Treatments / Results  Labs (all labs ordered are listed, but only abnormal results are displayed) Labs Reviewed  CBC WITH DIFFERENTIAL/PLATELET - Abnormal; Notable for the following components:      Result Value   RDW 16.7 (*)    All other components within normal limits  COMPREHENSIVE METABOLIC PANEL - Abnormal; Notable for the following components:   BUN 40 (*)    Creatinine, Ser 1.74 (*)    Alkaline Phosphatase 134 (*)    GFR calc non Af Amer 25 (*)    GFR calc Af Amer 29 (*)    All other components within normal limits  CK  URINALYSIS, ROUTINE W REFLEX MICROSCOPIC  TROPONIN I (HIGH SENSITIVITY)    EKG EKG Interpretation  Date/Time:  Sunday May 17 2019 16:50:20 EDT Ventricular Rate:  128 PR Interval:    QRS Duration: 157 QT Interval:  364 QTC Calculation: 497 R Axis:   -73 Text Interpretation:  Atrial fibrillation RBBB and LAFB Inferior infarct, old secondary degree heart block, rate faster than previous  Confirmed by Wandra Arthurs [phone]) on 05/17/2019 4:58:12 PM   Radiology Dg Chest 1 View  Result Date: 05/17/2019 CLINICAL DATA:  Fall EXAM: CHEST  1 VIEW COMPARISON:  12/20/2018 FINDINGS: Moderate cardiomegaly. Clear lungs. No  pleural effusion or pneumothorax. IMPRESSION: Moderate cardiomegaly, unchanged.  No  acute airspace disease. Electronically Signed   By: Ulyses Jarred M.D.   On: 05/17/2019 17:40   Ct Head Wo Contrast  Result Date: 05/17/2019 CLINICAL DATA:  Recent fall EXAM: CT HEAD WITHOUT CONTRAST CT CERVICAL SPINE WITHOUT CONTRAST TECHNIQUE: Multidetector CT imaging of the head and cervical spine was performed following the standard protocol without intravenous contrast. Multiplanar CT image reconstructions of the cervical spine were also generated. COMPARISON:  11/30/2017 FINDINGS: CT HEAD FINDINGS Brain: Mild atrophic changes and chronic white matter ischemic changes are seen. No findings to suggest acute hemorrhage, acute infarction or space-occupying mass lesion are noted. Vascular: No hyperdense vessel or unexpected calcification. Skull: Normal. Negative for fracture or focal lesion. Sinuses/Orbits: No acute finding. Other: None. CT CERVICAL SPINE FINDINGS Alignment: Within normal limits. Skull base and vertebrae: 7 cervical segments are well visualized. Vertebral body height is well maintained. Disc space narrowing is noted throughout the cervical spine with evidence of osteophytic change and facet hypertrophic change. No acute fracture or acute facet abnormality is noted. Soft tissues and spinal canal: Surrounding soft tissue structures show vascular calcifications of the carotid arteries. No other soft tissue abnormality is seen. Upper chest: Visualized lung apices are within normal limits. Other: None IMPRESSION: CT of the head: No acute intracranial abnormality is noted. Chronic atrophic and ischemic changes are again noted and stable. CT of the cervical spine: Multilevel degenerative change without acute abnormality. Electronically Signed   By: Inez Catalina M.D.   On: 05/17/2019 17:32   Ct Cervical Spine Wo Contrast  Result Date: 05/17/2019 CLINICAL DATA:  Recent fall EXAM: CT HEAD WITHOUT CONTRAST CT CERVICAL  SPINE WITHOUT CONTRAST TECHNIQUE: Multidetector CT imaging of the head and cervical spine was performed following the standard protocol without intravenous contrast. Multiplanar CT image reconstructions of the cervical spine were also generated. COMPARISON:  11/30/2017 FINDINGS: CT HEAD FINDINGS Brain: Mild atrophic changes and chronic white matter ischemic changes are seen. No findings to suggest acute hemorrhage, acute infarction or space-occupying mass lesion are noted. Vascular: No hyperdense vessel or unexpected calcification. Skull: Normal. Negative for fracture or focal lesion. Sinuses/Orbits: No acute finding. Other: None. CT CERVICAL SPINE FINDINGS Alignment: Within normal limits. Skull base and vertebrae: 7 cervical segments are well visualized. Vertebral body height is well maintained. Disc space narrowing is noted throughout the cervical spine with evidence of osteophytic change and facet hypertrophic change. No acute fracture or acute facet abnormality is noted. Soft tissues and spinal canal: Surrounding soft tissue structures show vascular calcifications of the carotid arteries. No other soft tissue abnormality is seen. Upper chest: Visualized lung apices are within normal limits. Other: None IMPRESSION: CT of the head: No acute intracranial abnormality is noted. Chronic atrophic and ischemic changes are again noted and stable. CT of the cervical spine: Multilevel degenerative change without acute abnormality. Electronically Signed   By: Inez Catalina M.D.   On: 05/17/2019 17:32   Ct Knee Right Wo Contrast  Result Date: 05/17/2019 CLINICAL DATA:  Right knee injury in a fall 05/15/2019. Initial encounter. EXAM: CT OF THE RIGHT KNEE WITHOUT CONTRAST TECHNIQUE: Multidetector CT imaging of the right knee was performed according to the standard protocol. Multiplanar CT image reconstructions were also generated. COMPARISON:  None. FINDINGS: Bones/Joint/Cartilage Bones are osteopenic. No acute bony or  joint abnormality is identified. Depression of the medial tibial plateau is likely due to remote insufficiency injury. The patient has severe tricompartmental osteoarthritis with bulky osteophytes present about the joint and innumerable loose bodies present.  Moderate joint effusion is noted. Ligaments Suboptimally assessed by CT. Muscles and Tendons Intact. Soft tissues Mild subcutaneous edema is present about the knee. IMPRESSION: No acute abnormality. Severe tricompartmental osteoarthritis. Depression of the medial tibial plateau is most compatible with remote insufficiency injury. Osteopenia. Electronically Signed   By: Inge Rise M.D.   On: 05/17/2019 17:24   Dg Knee Complete 4 Views Left  Result Date: 05/17/2019 CLINICAL DATA:  Right knee pain since a fall 05/15/2019. EXAM: LEFT KNEE - COMPLETE 4+ VIEW COMPARISON:  CT right knee this same day. FINDINGS: There is no acute bony or joint abnormality. Mild depression of the medial plateau is most consistent with prior stress injury and similar to changes seen on CT scan of the right knee today. The patient has severe tricompartmental osteoarthritis. Extensive chondrocalcinosis is present about the knee. Joint effusion and multiple loose bodies in the joint noted. IMPRESSION: No acute finding. Mild depression of the medial tibial plateau is similar to that seen on CT scan of the right knee this same day and most consistent with remote insufficiency injury. Severe tricompartmental osteoarthritis. Electronically Signed   By: Inge Rise M.D.   On: 05/17/2019 17:40   Dg Hip Unilat W Or Wo Pelvis 2-3 Views Right  Result Date: 05/17/2019 CLINICAL DATA:  Right hip and leg pain since a fall 05/15/2019. Initial encounter. EXAM: DG HIP (WITH OR WITHOUT PELVIS) 2-3V RIGHT COMPARISON:  Plain films right hip 11/29/2017. FINDINGS: No acute bony or joint abnormality is identified. Severe right hip osteoarthritis with bone-on-bone joint space narrowing,  subchondral sclerosis and chondrocalcinosis is unchanged in appearance. IMPRESSION: No acute abnormality. No change in severe right hip osteoarthritis. Electronically Signed   By: Inge Rise M.D.   On: 05/17/2019 17:38    Procedures Procedures (including critical care time)  Medications Ordered in ED Medications  diltiazem (CARDIZEM) injection 5 mg (5 mg Intravenous Given 05/17/19 1749)     Initial Impression / Assessment and Plan / ED Course  I have reviewed the triage vital signs and the nursing notes.  Pertinent labs & imaging results that were available during my care of the patient were reviewed by me and considered in my medical decision making (see chart for details).       Almendra Loria is a 85 y.o. female here with fall, R knee pain. Patient was noted to be in rapid A. fib on arrival with heart rate in the 130s.  Unclear clear if she also had a heart block as well.  She had previous admission for V. tach as well so consider arrhythmia causing her falls.  She also is advancing nature may have some failure to thrive as well.  Will check lab work as well as x-rays and CT head/neck/ R knee  6:40 PM CT showed previous tibial plateau fracture and arthritis with effusion. Consulted Dr. Erlinda Hong from ortho, who recommend conservative management and outpatient follow up. Given cardizem 5 mg IV and HR down to 90s. Talked to son who states that she lives at home and will need more help. She follows up with Dr. Doylene Canard, who will admit for observation    Final Clinical Impressions(s) / ED Diagnoses   Final diagnoses:  None    ED Discharge Orders    None       Drenda Freeze, MD 05/17/19 [phone]

## 2019-05-17 NOTE — ED Notes (Signed)
ED TO INPATIENT HANDOFF REPORT  Name/Age/Gender Shannon Cruz 85 y.o. female  Code Status    Code Status Orders  (From admission, onward)         Start     Ordered   05/17/19 1952  Do not attempt resuscitation (DNR)  Continuous    Question Answer Comment  In the event of cardiac or respiratory ARREST Do not call a "code blue"   In the event of cardiac or respiratory ARREST Do not perform Intubation, CPR, defibrillation or ACLS   In the event of cardiac or respiratory ARREST Use medication by any route, position, wound care, and other measures to relive pain and suffering. May use oxygen, suction and manual treatment of airway obstruction as needed for comfort.      05/17/19 1954        Code Status History    Date Active Date Inactive Code Status Order ID Comments User Context   12/20/2018 1831 12/23/2018 1639 Full Code [id number]  Elodia Florence., MD Inpatient   06/24/2017 1837 06/26/2017 2157 Partial Code [id number]  Dixie Dials, MD Inpatient   Advance Care Planning Activity      Home/SNF/Other Home  Chief Complaint Leg Pain  Level of Care/Admitting Diagnosis ED Disposition    ED Disposition Condition [place]: [facility] [[id number]]  Level of Care: Telemetry [5]  Admit to tele based on following criteria: Complex arrhythmia (Bradycardia/Tachycardia)  Covid Evaluation: Confirmed COVID Negative  Diagnosis: Atrial fibrillation with RVR [facility]) [[id number]]  Admitting Physician: Dixie Dials [[id number]]  Attending Physician: Dixie Dials [[id number]]  PT Class (Do Not Modify): Observation [104]  PT Acc Code (Do Not Modify): Observation [10022]       Medical History Past Medical History:  Diagnosis Date  . Arthritis   . H/O diastolic dysfunction   . Heart palpitations   . Hypertension   . RBBB (right bundle branch block) 11/20/2010   Echo - EF >55%; flow pattern suggestive of impaired LV relaxation, proximal septal thickening  noted; mitral valve mildly thickened; mild mitral annular calcification; aortic root sclerosis/calcification    Allergies No Known Allergies  IV Location/Drains/Wounds Patient Lines/Drains/Airways Status   Active Line/Drains/Airways    Name:   Placement date:   Placement time:   Site:   Days:   Peripheral IV 05/17/19 Right Forearm   05/17/19    1655    Forearm   less than 1          Labs/Imaging Results for orders placed or performed during the hospital encounter of 05/17/19 (from the past 48 hour(s))  CBC with Differential/Platelet     Status: Abnormal   Collection Time: 05/17/19  4:41 PM  Result Value Ref Range   WBC 6.4 4.0 - 10.5 K/uL   RBC 4.61 3.87 - 5.11 MIL/uL   Hemoglobin 12.2 12.0 - 15.0 g/dL   HCT 39.3 36.0 - 46.0 %   MCV 85.2 80.0 - 100.0 fL   MCH 26.5 26.0 - 34.0 pg   MCHC 31.0 30.0 - 36.0 g/dL   RDW 16.7 (H) 11.5 - 15.5 %   Platelets 327 150 - 400 K/uL   nRBC 0.0 0.0 - 0.2 %   Neutrophils Relative % 77 %   Neutro Abs 5.0 1.7 - 7.7 K/uL   Lymphocytes Relative 11 %   Lymphs Abs 0.7 0.7 - 4.0 K/uL   Monocytes Relative 10 %   Monocytes Absolute 0.6 0.1 - 1.0 K/uL  Eosinophils Relative 0 %   Eosinophils Absolute 0.0 0.0 - 0.5 K/uL   Basophils Relative 1 %   Basophils Absolute 0.1 0.0 - 0.1 K/uL   Immature Granulocytes 1 %   Abs Immature Granulocytes 0.03 0.00 - 0.07 K/uL    Comment: Performed at [facility], [place] [address], [place], [place] [address]  Comprehensive metabolic panel     Status: Abnormal   Collection Time: 05/17/19  4:41 PM  Result Value Ref Range   Sodium 140 135 - 145 mmol/L   Potassium 3.7 3.5 - 5.1 mmol/L   Chloride 106 98 - 111 mmol/L   CO2 22 22 - 32 mmol/L   Glucose, Bld 91 70 - 99 mg/dL   BUN 40 (H) 8 - 23 mg/dL   Creatinine, Ser 1.74 (H) 0.44 - 1.00 mg/dL   Calcium 9.6 8.9 - 10.3 mg/dL   Total Protein 7.4 6.5 - 8.1 g/dL   Albumin 3.8 3.5 - 5.0 g/dL   AST 29 15 - 41 U/L   ALT 15 0 - 44 U/L   Alkaline  Phosphatase 134 (H) 38 - 126 U/L   Total Bilirubin 1.0 0.3 - 1.2 mg/dL   GFR calc non Af Amer 25 (L) >60 mL/min   GFR calc Af Amer 29 (L) >60 mL/min   Anion gap 12 5 - 15    Comment: Performed at [facility], [place] [address] [place], [place] [address]  Troponin I (High Sensitivity)     Status: None   Collection Time: 05/17/19  4:41 PM  Result Value Ref Range   Troponin I (High Sensitivity) 12 <18 ng/L    Comment: (NOTE) Elevated high sensitivity troponin I (hsTnI) values and significant  changes across serial measurements may suggest ACS but many other  chronic and acute conditions are known to elevate hsTnI results.  Refer to the "Links" section for chest pain algorithms and additional  guidance. Performed at [facility], [place] [address], [place], [place] [address]   CK     Status: None   Collection Time: 05/17/19  4:41 PM  Result Value Ref Range   Total CK 79 38 - 234 U/L    Comment: Performed at [facility], [place] [address] [place], [place] [address]   Dg Chest 1 View  Result Date: 05/17/2019 CLINICAL DATA:  Fall EXAM: CHEST  1 VIEW COMPARISON:  12/20/2018 FINDINGS: Moderate cardiomegaly. Clear lungs. No pleural effusion or pneumothorax. IMPRESSION: Moderate cardiomegaly, unchanged.  No acute airspace disease. Electronically Signed   By: Ulyses Jarred M.D.   On: 05/17/2019 17:40   Ct Head Wo Contrast  Result Date: 05/17/2019 CLINICAL DATA:  Recent fall EXAM: CT HEAD WITHOUT CONTRAST CT CERVICAL SPINE WITHOUT CONTRAST TECHNIQUE: Multidetector CT imaging of the head and cervical spine was performed following the standard protocol without intravenous contrast. Multiplanar CT image reconstructions of the cervical spine were also generated. COMPARISON:  11/30/2017 FINDINGS: CT HEAD FINDINGS Brain: Mild atrophic changes and chronic white matter ischemic changes are seen. No findings to suggest acute hemorrhage, acute infarction or  space-occupying mass lesion are noted. Vascular: No hyperdense vessel or unexpected calcification. Skull: Normal. Negative for fracture or focal lesion. Sinuses/Orbits: No acute finding. Other: None. CT CERVICAL SPINE FINDINGS Alignment: Within normal limits. Skull base and vertebrae: 7 cervical segments are well visualized. Vertebral body height is well maintained. Disc space narrowing is noted throughout the cervical spine with evidence of osteophytic change and facet  hypertrophic change. No acute fracture or acute facet abnormality is noted. Soft tissues and spinal canal: Surrounding soft tissue structures show vascular calcifications of the carotid arteries. No other soft tissue abnormality is seen. Upper chest: Visualized lung apices are within normal limits. Other: None IMPRESSION: CT of the head: No acute intracranial abnormality is noted. Chronic atrophic and ischemic changes are again noted and stable. CT of the cervical spine: Multilevel degenerative change without acute abnormality. Electronically Signed   By: Inez Catalina M.D.   On: 05/17/2019 17:32   Ct Cervical Spine Wo Contrast  Result Date: 05/17/2019 CLINICAL DATA:  Recent fall EXAM: CT HEAD WITHOUT CONTRAST CT CERVICAL SPINE WITHOUT CONTRAST TECHNIQUE: Multidetector CT imaging of the head and cervical spine was performed following the standard protocol without intravenous contrast. Multiplanar CT image reconstructions of the cervical spine were also generated. COMPARISON:  11/30/2017 FINDINGS: CT HEAD FINDINGS Brain: Mild atrophic changes and chronic white matter ischemic changes are seen. No findings to suggest acute hemorrhage, acute infarction or space-occupying mass lesion are noted. Vascular: No hyperdense vessel or unexpected calcification. Skull: Normal. Negative for fracture or focal lesion. Sinuses/Orbits: No acute finding. Other: None. CT CERVICAL SPINE FINDINGS Alignment: Within normal limits. Skull base and vertebrae: 7 cervical  segments are well visualized. Vertebral body height is well maintained. Disc space narrowing is noted throughout the cervical spine with evidence of osteophytic change and facet hypertrophic change. No acute fracture or acute facet abnormality is noted. Soft tissues and spinal canal: Surrounding soft tissue structures show vascular calcifications of the carotid arteries. No other soft tissue abnormality is seen. Upper chest: Visualized lung apices are within normal limits. Other: None IMPRESSION: CT of the head: No acute intracranial abnormality is noted. Chronic atrophic and ischemic changes are again noted and stable. CT of the cervical spine: Multilevel degenerative change without acute abnormality. Electronically Signed   By: Inez Catalina M.D.   On: 05/17/2019 17:32   Ct Knee Right Wo Contrast  Result Date: 05/17/2019 CLINICAL DATA:  Right knee injury in a fall 05/15/2019. Initial encounter. EXAM: CT OF THE RIGHT KNEE WITHOUT CONTRAST TECHNIQUE: Multidetector CT imaging of the right knee was performed according to the standard protocol. Multiplanar CT image reconstructions were also generated. COMPARISON:  None. FINDINGS: Bones/Joint/Cartilage Bones are osteopenic. No acute bony or joint abnormality is identified. Depression of the medial tibial plateau is likely due to remote insufficiency injury. The patient has severe tricompartmental osteoarthritis with bulky osteophytes present about the joint and innumerable loose bodies present. Moderate joint effusion is noted. Ligaments Suboptimally assessed by CT. Muscles and Tendons Intact. Soft tissues Mild subcutaneous edema is present about the knee. IMPRESSION: No acute abnormality. Severe tricompartmental osteoarthritis. Depression of the medial tibial plateau is most compatible with remote insufficiency injury. Osteopenia. Electronically Signed   By: Inge Rise M.D.   On: 05/17/2019 17:24   Dg Knee Complete 4 Views Left  Result Date:  05/17/2019 CLINICAL DATA:  Right knee pain since a fall 05/15/2019. EXAM: LEFT KNEE - COMPLETE 4+ VIEW COMPARISON:  CT right knee this same day. FINDINGS: There is no acute bony or joint abnormality. Mild depression of the medial plateau is most consistent with prior stress injury and similar to changes seen on CT scan of the right knee today. The patient has severe tricompartmental osteoarthritis. Extensive chondrocalcinosis is present about the knee. Joint effusion and multiple loose bodies in the joint noted. IMPRESSION: No acute finding. Mild depression of the medial tibial plateau is  similar to that seen on CT scan of the right knee this same day and most consistent with remote insufficiency injury. Severe tricompartmental osteoarthritis. Electronically Signed   By: Inge Rise M.D.   On: 05/17/2019 17:40   Dg Hip Unilat W Or Wo Pelvis 2-3 Views Right  Result Date: 05/17/2019 CLINICAL DATA:  Right hip and leg pain since a fall 05/15/2019. Initial encounter. EXAM: DG HIP (WITH OR WITHOUT PELVIS) 2-3V RIGHT COMPARISON:  Plain films right hip 11/29/2017. FINDINGS: No acute bony or joint abnormality is identified. Severe right hip osteoarthritis with bone-on-bone joint space narrowing, subchondral sclerosis and chondrocalcinosis is unchanged in appearance. IMPRESSION: No acute abnormality. No change in severe right hip osteoarthritis. Electronically Signed   By: Inge Rise M.D.   On: 05/17/2019 17:38    Pending Labs Unresulted Labs (From admission, onward)    Start     Ordered   05/18/19 0240  Basic metabolic panel  Tomorrow morning,   R     05/17/19 1954   05/18/19 0500  CBC  Tomorrow morning,   R     05/17/19 1954   05/17/19 1839  SARS Coronavirus 2 (CEPHEID - Performed in [place] hospital lab), Hosp Order  (Asymptomatic Patients Labs)  Once,   STAT    Question:  Rule Out  Answer:  Yes   05/17/19 1838   05/17/19 1646  Urinalysis, Routine w reflex microscopic  Once,   STAT      05/17/19 1645          Vitals/Pain Today's Vitals   05/17/19 1630 05/17/19 1633 05/17/19 1748 05/17/19 1755  BP: (!) 157/84  (!) 156/90 (!) 141/79  Pulse: 99   88  Resp: 16  20 (!) 22  Temp: 98.3 F (36.8 C)     TempSrc: Oral     SpO2: 100%   100%  PainSc:  6       Isolation Precautions No active isolations  Medications Medications  heparin injection 5,000 Units (has no administration in time range)  sodium chloride flush (NS) 0.9 % injection 3 mL (has no administration in time range)  sodium chloride flush (NS) 0.9 % injection 3 mL (has no administration in time range)  0.9 %  sodium chloride infusion (has no administration in time range)  acetaminophen (TYLENOL) tablet 650 mg (has no administration in time range)  aspirin chewable tablet 81 mg (has no administration in time range)  diltiazem (CARDIZEM) tablet 30 mg (has no administration in time range)  metoprolol tartrate (LOPRESSOR) tablet 25 mg (has no administration in time range)  nitroGLYCERIN (NITROSTAT) SL tablet 0.4 mg (has no administration in time range)  potassium chloride (K-DUR) CR tablet 10 mEq (has no administration in time range)  diltiazem (CARDIZEM) injection 5 mg (5 mg Intravenous Given 05/17/19 1749)    Mobility non-ambulatory

## 2019-05-18 ENCOUNTER — Other Ambulatory Visit: Payer: Self-pay

## 2019-05-18 LAB — CBC
HCT: 37.9 % (ref 36.0–46.0)
Hemoglobin: 11.4 g/dL — ABNORMAL LOW (ref 12.0–15.0)
MCH: 26 pg (ref 26.0–34.0)
MCHC: 30.1 g/dL (ref 30.0–36.0)
MCV: 86.3 fL (ref 80.0–100.0)
Platelets: 331 10*3/uL (ref 150–400)
RBC: 4.39 MIL/uL (ref 3.87–5.11)
RDW: 16.6 % — ABNORMAL HIGH (ref 11.5–15.5)
WBC: 6.1 10*3/uL (ref 4.0–10.5)
nRBC: 0 % (ref 0.0–0.2)

## 2019-05-18 LAB — BASIC METABOLIC PANEL
Anion gap: 11 (ref 5–15)
BUN: 32 mg/dL — ABNORMAL HIGH (ref 8–23)
CO2: 22 mmol/L (ref 22–32)
Calcium: 9.2 mg/dL (ref 8.9–10.3)
Chloride: 107 mmol/L (ref 98–111)
Creatinine, Ser: 1.36 mg/dL — ABNORMAL HIGH (ref 0.44–1.00)
GFR calc Af Amer: 39 mL/min — ABNORMAL LOW (ref >60)
GFR calc non Af Amer: 34 mL/min — ABNORMAL LOW (ref >60)
Glucose, Bld: 94 mg/dL (ref 70–99)
Potassium: 3.3 mmol/L — ABNORMAL LOW (ref 3.5–5.1)
Sodium: 140 mmol/L (ref 135–145)

## 2019-05-18 MED ORDER — HYDRALAZINE HCL 25 MG PO TABS
25.0000 mg | ORAL_TABLET | Freq: Two times a day (BID) | ORAL | Status: DC
  Administered 2019-05-18 – 2019-05-21 (×6): 25 mg via ORAL
  Filled 2019-05-18 (×6): qty 1

## 2019-05-18 MED ORDER — BISACODYL 10 MG RE SUPP
10.0000 mg | Freq: Every day | RECTAL | Status: DC | PRN
  Administered 2019-05-18: 10 mg via RECTAL
  Filled 2019-05-18: qty 1

## 2019-05-18 NOTE — Progress Notes (Signed)
Ref: Dixie Dials, MD   Subjective:  Awake, sitting up feeding self. VS stable except episodes of bradycardia. Off diltiazem and now HR in 90's. Moderately elevated blood pressure.  Objective:  Vital Signs in the last 24 hours: Temp:  [98.3 F (36.8 C)-99 F (37.2 C)] 98.6 F (37 C) (07/13 0628) Pulse Rate:  [67-99] 67 (07/13 0628) Cardiac Rhythm: Atrial flutter (07/13 0709) Resp:  [16-22] 17 (07/13 0628) BP: (134-170)/(79-107) 163/94 (07/13 0628) SpO2:  [97 %-100 %] 100 % (07/13 0628) Weight:  [55.2 kg] 55.2 kg (07/12 2242)  Physical Exam: BP Readings from Last 1 Encounters:  05/18/19 (!) 163/94     Wt Readings from Last 1 Encounters:  05/17/19 55.2 kg    Weight change:  Body mass index is 19.06 kg/m. HEENT: [place]/AT, Eyes-Brown, Conjunctiva-Pink, Sclera-Non-icteric Neck: No JVD, No bruit, Trachea midline. Lungs:  Clear, Bilateral. Cardiac:  Regular rhythm, normal S1 and S2, no S3. III/VI systolic murmur. Abdomen:  Soft, non-tender. BS present. Extremities:  No edema present. No cyanosis. No clubbing. Bilateral knee tenderness. CNS: AxOx3, Cranial nerves grossly intact, moves all 4 extremities.  Skin: Warm and dry.   Intake/Output from previous day: 07/12 0701 - 07/13 0700 In: -  Out: 200 [Urine:200]    Lab Results: BMET    Component Value Date/Time   NA 140 05/18/2019 0524   NA 140 05/17/2019 1641   NA 138 12/21/2018 0252   K 3.3 (L) 05/18/2019 0524   K 3.7 05/17/2019 1641   K 3.9 12/21/2018 0252   CL 107 05/18/2019 0524   CL 106 05/17/2019 1641   CL 105 12/21/2018 0252   CO2 22 05/18/2019 0524   CO2 22 05/17/2019 1641   CO2 25 12/21/2018 0252   GLUCOSE 94 05/18/2019 0524   GLUCOSE 91 05/17/2019 1641   GLUCOSE 107 (H) 12/21/2018 0252   BUN 32 (H) 05/18/2019 0524   BUN 40 (H) 05/17/2019 1641   BUN 30 (H) 12/21/2018 0252   CREATININE 1.36 (H) 05/18/2019 0524   CREATININE 1.74 (H) 05/17/2019 1641   CREATININE 1.61 (H) 12/21/2018 0252   CALCIUM 9.2  05/18/2019 0524   CALCIUM 9.6 05/17/2019 1641   CALCIUM 9.2 12/21/2018 0252   GFRNONAA 34 (L) 05/18/2019 0524   GFRNONAA 25 (L) 05/17/2019 1641   GFRNONAA 28 (L) 12/21/2018 0252   GFRAA 39 (L) 05/18/2019 0524   GFRAA 29 (L) 05/17/2019 1641   GFRAA 32 (L) 12/21/2018 0252   CBC    Component Value Date/Time   WBC 6.1 05/18/2019 0524   RBC 4.39 05/18/2019 0524   HGB 11.4 (L) 05/18/2019 0524   HCT 37.9 05/18/2019 0524   PLT 331 05/18/2019 0524   MCV 86.3 05/18/2019 0524   MCH 26.0 05/18/2019 0524   MCHC 30.1 05/18/2019 0524   RDW 16.6 (H) 05/18/2019 0524   LYMPHSABS 0.7 05/17/2019 1641   MONOABS 0.6 05/17/2019 1641   EOSABS 0.0 05/17/2019 1641   BASOSABS 0.1 05/17/2019 1641   HEPATIC Function Panel Recent Labs    06/22/18 1800 12/21/18 0252 05/17/19 1641  PROT 7.5 6.6 7.4   HEMOGLOBIN A1C No components found for: HGA1C,  MPG CARDIAC ENZYMES Lab Results  Component Value Date   CKTOTAL 79 05/17/2019   TROPONINI <0.03 12/21/2018   TROPONINI <0.03 12/21/2018   TROPONINI <0.03 12/20/2018   BNP No results for input(s): PROBNP in the last 8760 hours. TSH Recent Labs    12/21/18 0252  TSH 4.241   CHOLESTEROL No results for  input(s): CHOL in the last 8760 hours.  Scheduled Meds: . aspirin  81 mg Oral Daily  . heparin  5,000 Units Subcutaneous Q8H  . metoprolol tartrate  25 mg Oral BID  . potassium chloride  10 mEq Oral Daily  . sodium chloride flush  3 mL Intravenous Q12H   Continuous Infusions: . sodium chloride     PRN Meds:.sodium chloride, acetaminophen, nitroGLYCERIN, sodium chloride flush  Assessment/Plan: Atrial fibrillation, chronic HTN Right hip severe osteoarthritis Right knee severe osteoarthritis Severe cervial disc disease Osteopenia High grade AV block  Discontinue diltiazem Add Hydralazine for BP control. PT evaluation and treat. Refuses SNF placement. Discuss care with son. Consider bedside commode and wheelchair use.   LOS: 0 days    Time spent including chart review, lab review, examination, discussion with patient : 30 min   Dixie Dials  MD  05/18/2019, 1:14 PM

## 2019-05-18 NOTE — Progress Notes (Signed)
Patient had a 2.04 sec pause while sleeping. Vitals performed and are stable. Dr Doylene Canard notified and new order received to discontinue diltiazem.  Will continue to monitor patient.

## 2019-05-18 NOTE — Evaluation (Signed)
Physical Therapy Evaluation Patient Details Name: Shannon Cruz MRN: [id number] DOB: 1925-05-26 Today's Date: 05/18/2019   History of Present Illness  85 yo female admitted to ED on 7/12 for afib with RVR, s/p fall on 7/10 with R hip and RLE pain. CT reveals bilateral tibial plateau depression with severe tricompartmental OA with R jt effusion. CT head unremarkable. PMH includes osteopenia, HTN, edema, aflutter.  Clinical Impression   Pt presents with mild R knee and RLE pain, generalized weakness, difficulty performing mobility tasks, unsteadiness in standing, very slow gait speed putting pt at increased fall risk, and decreased activity tolerance. Pt to benefit from acute PT to address deficits. Pt ambulated short hallway distance with RW with min assist for steadying, safety with use of RW. Pt HR during session ranged from 86-107 bpm. PT recommending SNF, pt refusing stating one of her nieces can stay with her. Pt also states "my kids are going to put me in a home and take all my money, I don't trust nobody". Pt will requires HHPT with [place] aide with 24/7 assist if she indeed goes home, PT recommending against home at his time given pt mobility status. PT to progress mobility as tolerated, and will continue to follow acutely.      Follow Up Recommendations SNF;Supervision/Assistance - 24 hour    Equipment Recommendations  None recommended by PT    Recommendations for Other Services       Precautions / Restrictions Precautions Precautions: Fall Restrictions Weight Bearing Restrictions: No Other Position/Activity Restrictions: No WB noted in orders or in chart review      Mobility  Bed Mobility Overal bed mobility: Needs Assistance             General bed mobility comments: Pt up in chair upon PT arrival to room, requesting to stay up in chair after PT session.  Transfers Overall transfer level: Needs assistance Equipment used: Rolling walker (2 wheeled) Transfers: Sit  to/from Stand Sit to Stand: Min assist;From elevated surface;Mod assist         General transfer comment: Min assist for first sit to stand for power up, steadying. Pt required increased assist post-ambulation when placing chair pad for power up, pt unable to maintain standing >10 seconds due to fatigue.  Ambulation/Gait Ambulation/Gait assistance: Min assist Gait Distance (Feet): 40 Feet Assistive device: Rolling walker (2 wheeled) Gait Pattern/deviations: Step-through pattern;Trunk flexed;Drifts right/left;Decreased stride length;Wide base of support Gait velocity: very decr Gait velocity interpretation: <1.31 ft/sec, indicative of household ambulator General Gait Details: Min assist for steadying, guiding pt and RW. Pt unsteady and very slow with gait, requires verbal cuing for placement in RW, turning with RW, safety with hallway management.  Stairs            Wheelchair Mobility    Modified Rankin (Stroke Patients Only)       Balance Overall balance assessment: Needs assistance;History of Falls(pt reports 4 falls in the past year)   Sitting balance-Leahy Scale: Fair     Standing balance support: Bilateral upper extremity supported Standing balance-Leahy Scale: Poor Standing balance comment: reliant on external UE support                             Pertinent Vitals/Pain Pain Assessment: Faces Faces Pain Scale: Hurts a little bit Pain Location: R knee Pain Descriptors / Indicators: Sore Pain Intervention(s): Limited activity within patient's tolerance;Monitored during session;Repositioned    Home Living Family/patient expects to  be discharged to:: Private residence Living Arrangements: Alone Available Help at Discharge: Family;Neighbor;Available PRN/intermittently Type of Home: House Home Access: Stairs to enter Entrance Stairs-Rails: Right;Left;Can reach both Entrance Stairs-Number of Steps: 4 Home Layout: One level Home Equipment: Walker - 2  wheels;Cane - single point;Bedside commode      Prior Function Level of Independence: Independent with assistive device(s)         Comments: pt reports using RW for ambulation as needed in house, reports independence with ADLs     Hand Dominance   Dominant Hand: Right    Extremity/Trunk Assessment   Upper Extremity Assessment Upper Extremity Assessment: Generalized weakness    Lower Extremity Assessment Lower Extremity Assessment: Generalized weakness(Pt able to perform full AROM knee flexion/tension, hip flexion/extension. RLE limited somewhat by pain)    Cervical / Trunk Assessment Cervical / Trunk Assessment: Other exceptions;Kyphotic Cervical / Trunk Exceptions: severe genu varus with bilateral knee joint enlargement. Pt with forward flexed posture in standing, even with verbal cues to correct  Communication   Communication: HOH  Cognition Arousal/Alertness: Awake/alert Behavior During Therapy: WFL for tasks assessed/performed Overall Cognitive Status: Within Functional Limits for tasks assessed                                        General Comments      Exercises     Assessment/Plan    PT Assessment Patient needs continued PT services  PT Problem List Decreased strength;Decreased mobility;Decreased safety awareness;Decreased range of motion;Decreased activity tolerance;Decreased balance;Decreased knowledge of use of DME;Pain       PT Treatment Interventions DME instruction;Therapeutic activities;Therapeutic exercise;Gait training;Patient/family education;Balance training;Functional mobility training    PT Goals (Current goals can be found in the Care Plan section)  Acute Rehab PT Goals Patient Stated Goal: go home PT Goal Formulation: With patient Time For Goal Achievement: 06/01/19 Potential to Achieve Goals: Fair    Frequency Min 2X/week   Barriers to discharge        Co-evaluation               AM-PAC PT "6 Clicks"  Mobility  Outcome Measure Help needed turning from your back to your side while in a flat bed without using bedrails?: A Lot Help needed moving from lying on your back to sitting on the side of a flat bed without using bedrails?: A Lot Help needed moving to and from a bed to a chair (including a wheelchair)?: A Lot Help needed standing up from a chair using your arms (e.g., wheelchair or bedside chair)?: A Lot Help needed to walk in hospital room?: A Little Help needed climbing 3-5 steps with a railing? : A Lot 6 Click Score: 13    End of Session Equipment Utilized During Treatment: Gait belt Activity Tolerance: Patient limited by fatigue Patient left: in chair;with call bell/phone within reach;with chair alarm set Nurse Communication: Mobility status PT Visit Diagnosis: Other abnormalities of gait and mobility (R26.89);Difficulty in walking, not elsewhere classified (R26.2);History of falling (Z91.81)    Time: 7616-0737 PT Time Calculation (min) (ACUTE ONLY): 32 min   Charges:   PT Evaluation $PT Eval Low Complexity: 1 Low PT Treatments $Gait Training: 8-22 mins      Julien Girt, PT Acute Rehabilitation Services Pager [phone]  Office [phone]   Gwenivere Hiraldo D Elonda Husky 05/18/2019, 3:22 PM

## 2019-05-19 DIAGNOSIS — I4821 Permanent atrial fibrillation: Secondary | ICD-10-CM | POA: Diagnosis not present

## 2019-05-19 DIAGNOSIS — Y92009 Unspecified place in unspecified non-institutional (private) residence as the place of occurrence of the external cause: Secondary | ICD-10-CM | POA: Diagnosis not present

## 2019-05-19 DIAGNOSIS — I4891 Unspecified atrial fibrillation: Secondary | ICD-10-CM | POA: Diagnosis not present

## 2019-05-19 DIAGNOSIS — I4892 Unspecified atrial flutter: Secondary | ICD-10-CM | POA: Diagnosis present

## 2019-05-19 DIAGNOSIS — M8580 Other specified disorders of bone density and structure, unspecified site: Secondary | ICD-10-CM | POA: Diagnosis present

## 2019-05-19 DIAGNOSIS — I452 Bifascicular block: Secondary | ICD-10-CM | POA: Diagnosis present

## 2019-05-19 DIAGNOSIS — I10 Essential (primary) hypertension: Secondary | ICD-10-CM | POA: Diagnosis not present

## 2019-05-19 DIAGNOSIS — M1611 Unilateral primary osteoarthritis, right hip: Secondary | ICD-10-CM | POA: Diagnosis present

## 2019-05-19 DIAGNOSIS — R001 Bradycardia, unspecified: Secondary | ICD-10-CM | POA: Diagnosis present

## 2019-05-19 DIAGNOSIS — M25551 Pain in right hip: Secondary | ICD-10-CM | POA: Diagnosis not present

## 2019-05-19 DIAGNOSIS — M1711 Unilateral primary osteoarthritis, right knee: Secondary | ICD-10-CM | POA: Diagnosis present

## 2019-05-19 DIAGNOSIS — M25561 Pain in right knee: Secondary | ICD-10-CM | POA: Diagnosis not present

## 2019-05-19 DIAGNOSIS — I129 Hypertensive chronic kidney disease with stage 1 through stage 4 chronic kidney disease, or unspecified chronic kidney disease: Secondary | ICD-10-CM | POA: Diagnosis present

## 2019-05-19 DIAGNOSIS — Z1159 Encounter for screening for other viral diseases: Secondary | ICD-10-CM | POA: Diagnosis not present

## 2019-05-19 DIAGNOSIS — R531 Weakness: Secondary | ICD-10-CM | POA: Diagnosis present

## 2019-05-19 DIAGNOSIS — I517 Cardiomegaly: Secondary | ICD-10-CM | POA: Diagnosis present

## 2019-05-19 DIAGNOSIS — W06XXXA Fall from bed, initial encounter: Secondary | ICD-10-CM | POA: Diagnosis present

## 2019-05-19 DIAGNOSIS — N182 Chronic kidney disease, stage 2 (mild): Secondary | ICD-10-CM | POA: Diagnosis present

## 2019-05-19 DIAGNOSIS — Z7982 Long term (current) use of aspirin: Secondary | ICD-10-CM | POA: Diagnosis not present

## 2019-05-19 DIAGNOSIS — Z87891 Personal history of nicotine dependence: Secondary | ICD-10-CM | POA: Diagnosis not present

## 2019-05-19 DIAGNOSIS — S82144A Nondisplaced bicondylar fracture of right tibia, initial encounter for closed fracture: Secondary | ICD-10-CM | POA: Diagnosis present

## 2019-05-19 DIAGNOSIS — M5030 Other cervical disc degeneration, unspecified cervical region: Secondary | ICD-10-CM | POA: Diagnosis present

## 2019-05-19 DIAGNOSIS — I482 Chronic atrial fibrillation, unspecified: Secondary | ICD-10-CM | POA: Diagnosis present

## 2019-05-19 MED ORDER — DILTIAZEM HCL 30 MG PO TABS
30.0000 mg | ORAL_TABLET | Freq: Once | ORAL | Status: AC
  Administered 2019-05-19: 30 mg via ORAL
  Filled 2019-05-19: qty 1

## 2019-05-19 MED ORDER — DILTIAZEM HCL 30 MG PO TABS
30.0000 mg | ORAL_TABLET | Freq: Every day | ORAL | Status: DC
  Administered 2019-05-19 – 2019-05-20 (×2): 30 mg via ORAL
  Filled 2019-05-19 (×2): qty 1

## 2019-05-19 NOTE — Progress Notes (Deleted)
Patient needs standard manual wheelchair with seat cushion and a bedside commode which was ordered by Dr. Doylene Canard on July 13th, 2020. The order comments per Dr. Doylene Canard stated "Patient suffers from Severe osteoarthritis of hips and knees which impairs their ability to perform daily activities like bathing in the home. A walker will not resolve issue with performing activities of daily living. A wheelchair will allow patient to safely perform daily activities. Patient can safely propel the wheelchair in the home or has a caregiver who can provide assistance. Length of need Lifetime.  Accessories: elevating leg rests (ELRs), wheel locks, extensions and anti-tippers"

## 2019-05-19 NOTE — Progress Notes (Signed)
Patient needs standard manual wheelchair with seat cushion and a bedside commode which was ordered by Dr. Doylene Canard on July 13th, 2020. The order comments per Dr. Doylene Canard stated "Patient suffers from Severe osteoarthritis of hips and knees which impairs their ability to perform daily activities like bathing in the home. A walker will not resolve issue with performing activities of daily living. A wheelchair will allow patient to safely perform daily activities. Patient can safely propel the wheelchair in the home or has a caregiver who can provide assistance. Length of need Lifetime.  Accessories: elevating leg rests (ELRs), wheel locks, extensions and anti-tippers"

## 2019-05-19 NOTE — Progress Notes (Addendum)
Ref: Dixie Dials, MD   Subjective:  Awake. HR in 90's. BP 158/98. Very little mobility and SNF recommended by PT but patient is against NH placement.   Objective:  Vital Signs in the last 24 hours: Temp:  [97.7 F (36.5 C)-98.2 F (36.8 C)] 97.7 F (36.5 C) (07/14 0521) Pulse Rate:  [66-96] 96 (07/14 0820) Cardiac Rhythm: Atrial fibrillation;Bundle branch block (07/14 0714) Resp:  [16-18] 16 (07/14 0521) BP: (146-159)/(88-98) 158/98 (07/14 0820) SpO2:  [99 %-100 %] 99 % (07/14 0820)  Physical Exam: BP Readings from Last 1 Encounters:  05/19/19 (!) 158/98     Wt Readings from Last 1 Encounters:  05/17/19 55.2 kg    Weight change:  Body mass index is 19.06 kg/m. HEENT: [place]/AT, Eyes-Brown, Conjunctiva-Pink, Sclera-Non-icteric Neck: No JVD, No bruit, Trachea midline. Lungs:  Clear, Bilateral. Cardiac:  Regular rhythm, normal S1 and S2, no S3. III/VI systolic murmur. Abdomen:  Soft, non-tender. BS present. Extremities:  No edema present. No cyanosis. No clubbing. Bilateral knee tenderness. CNS: AxOx3, Cranial nerves grossly intact, moves all 4 extremities.  Skin: Warm and dry.   Intake/Output from previous day: 07/13 0701 - 07/14 0700 In: -  Out: 300 [Urine:300]    Lab Results: BMET    Component Value Date/Time   NA 140 05/18/2019 0524   NA 140 05/17/2019 1641   NA 138 12/21/2018 0252   K 3.3 (L) 05/18/2019 0524   K 3.7 05/17/2019 1641   K 3.9 12/21/2018 0252   CL 107 05/18/2019 0524   CL 106 05/17/2019 1641   CL 105 12/21/2018 0252   CO2 22 05/18/2019 0524   CO2 22 05/17/2019 1641   CO2 25 12/21/2018 0252   GLUCOSE 94 05/18/2019 0524   GLUCOSE 91 05/17/2019 1641   GLUCOSE 107 (H) 12/21/2018 0252   BUN 32 (H) 05/18/2019 0524   BUN 40 (H) 05/17/2019 1641   BUN 30 (H) 12/21/2018 0252   CREATININE 1.36 (H) 05/18/2019 0524   CREATININE 1.74 (H) 05/17/2019 1641   CREATININE 1.61 (H) 12/21/2018 0252   CALCIUM 9.2 05/18/2019 0524   CALCIUM 9.6 05/17/2019 1641    CALCIUM 9.2 12/21/2018 0252   GFRNONAA 34 (L) 05/18/2019 0524   GFRNONAA 25 (L) 05/17/2019 1641   GFRNONAA 28 (L) 12/21/2018 0252   GFRAA 39 (L) 05/18/2019 0524   GFRAA 29 (L) 05/17/2019 1641   GFRAA 32 (L) 12/21/2018 0252   CBC    Component Value Date/Time   WBC 6.1 05/18/2019 0524   RBC 4.39 05/18/2019 0524   HGB 11.4 (L) 05/18/2019 0524   HCT 37.9 05/18/2019 0524   PLT 331 05/18/2019 0524   MCV 86.3 05/18/2019 0524   MCH 26.0 05/18/2019 0524   MCHC 30.1 05/18/2019 0524   RDW 16.6 (H) 05/18/2019 0524   LYMPHSABS 0.7 05/17/2019 1641   MONOABS 0.6 05/17/2019 1641   EOSABS 0.0 05/17/2019 1641   BASOSABS 0.1 05/17/2019 1641   HEPATIC Function Panel Recent Labs    06/22/18 1800 12/21/18 0252 05/17/19 1641  PROT 7.5 6.6 7.4   HEMOGLOBIN A1C No components found for: HGA1C,  MPG CARDIAC ENZYMES Lab Results  Component Value Date   CKTOTAL 79 05/17/2019   TROPONINI <0.03 12/21/2018   TROPONINI <0.03 12/21/2018   TROPONINI <0.03 12/20/2018   BNP No results for input(s): PROBNP in the last 8760 hours. TSH Recent Labs    12/21/18 0252  TSH 4.241   CHOLESTEROL No results for input(s): CHOL in the last 8760 hours.  Scheduled Meds: . aspirin  81 mg Oral Daily  . heparin  5,000 Units Subcutaneous Q8H  . hydrALAZINE  25 mg Oral BID  . metoprolol tartrate  25 mg Oral BID  . potassium chloride  10 mEq Oral Daily  . sodium chloride flush  3 mL Intravenous Q12H   Continuous Infusions: . sodium chloride     PRN Meds:.sodium chloride, acetaminophen, bisacodyl, nitroGLYCERIN, sodium chloride flush  Assessment/Plan: Atrial fibrillation, chronic HTN Right hip severe osteoarthritis Right knee severe osteoarhtritis Weakness Gait abnormality  Add small dose diltiazem. Increase activity as tolerated. Home tomorrow if patient and family agrees. Needs bedside commode and wheelchair with cushion for mobility and retard pressure injury due to weakness and unsteady  gait.   LOS: 0 days   Time spent including chart review, lab review, examination, discussion with patient : 30 min   Dixie Dials  MD  05/19/2019, 12:59 PM

## 2019-05-19 NOTE — TOC Initial Note (Signed)
Transition of Care [facility]) - Initial/Assessment Note    Patient Details  Name: Shannon Cruz MRN: [id number] Date of Birth: 06-01-1925  Transition of Care [facility]) CM/SW Contact:    Wende Neighbors, LCSW Phone Number: 05/19/2019, 1:59 PM  Clinical Narrative:   CSW met patient at bedside to discuss discharge plans. Patient was very addiment about not going to rehab. CSW asked if she would be willing to have [place] in the home and patient  became upset stating she does not want anyone in her home and she does not trust anyone in her house. Patient stated she has a huge family  And they help her with anything she needs. Patient stated her son will pick her up from the hospital and she does not want CSW to call her son to verify. Patient stating "I birth them they did not birth me, you do not need to talk to my son I will call him to come and get me".     CSW ordered bedside commode and wheelchair from [employer]    Expected Discharge Plan: Home/Self Care Barriers to Discharge: Continued Medical Work up   Patient Goals and CMS Choice Patient states their goals for this hospitalization and ongoing recovery are:: to be at home and be at peace CMS Medicare.gov Compare Post Acute Care list provided to:: Patient(refused HH) Choice offered to / list presented to : Patient(pt refused hh)  Expected Discharge Plan and Services Expected Discharge Plan: Home/Self Care In-house Referral: Clinical Social Work   Post Acute Care Choice: NA Living arrangements for the past 2 months: [place]                 DME Arranged: Bedside commode, Wheelchair manual DME Agency: [employer] Date DME Agency Contacted: 05/19/19 Time DME Agency Contacted: [id number] Representative spoke with at DME Agency: [facility] HH Arranged: Refused HH          Prior Living Arrangements/Services Living arrangements for the past 2 months: [place] Lives with:: Self(pt stated that family copmes and helps her with  needs) Patient language and need for interpreter reviewed:: Yes Do you feel safe going back to the place where you live?: Yes      Need for Family Participation in Patient Care: Yes (Comment) Care giver support system in place?: Yes (comment)      Activities of Daily Living Home Assistive Devices/Equipment: Cane (specify quad or straight), Walker (specify type) ADL Screening (condition at time of admission) Patient's cognitive ability adequate to safely complete daily activities?: No Is the patient deaf or have difficulty hearing?: Yes Does the patient have difficulty seeing, even when wearing glasses/contacts?: No Does the patient have difficulty concentrating, remembering, or making decisions?: No Patient able to express need for assistance with ADLs?: Yes Does the patient have difficulty dressing or bathing?: No Independently performs ADLs?: Yes (appropriate for developmental age) Does the patient have difficulty walking or climbing stairs?: Yes Weakness of Legs: Both Weakness of Arms/Hands: Both  Permission Sought/Granted   Permission granted to share information with : No(pt does not want CSW to contact family)              Emotional Assessment Appearance:: Appears stated age Attitude/Demeanor/Rapport: Engaged Affect (typically observed): Accepting Orientation: : Oriented to Self, Oriented to Place, Oriented to  Time, Oriented to Situation Alcohol / Substance Use: Not Applicable Psych Involvement: No (comment)  Admission diagnosis:  Permanent atrial fibrillation [I48.21] Tibial plateau fracture, right, closed, with routine healing, subsequent encounter [  S82.141D] Tibial plateau fracture, left, closed, with routine healing, subsequent encounter [S82.142D] Patient Active Problem List   Diagnosis Date Noted  . Atrial fibrillation with RVR ([place]) 05/17/2019  . Atrial flutter ([place]) 12/20/2018  . Bilateral leg edema 06/24/2017  . Abdominal pain 06/24/2017  . Atrial  bigeminy 10/09/2013  . HTN (hypertension) 03/19/2013  . RBBB (right bundle branch block) 03/19/2013  . Left shoulder pain 03/19/2013   PCP:  Dixie Dials, MD Pharmacy:   [facility] [place] - [address] [place] [address] Phone: [phone] Fax: [phone]     Social Determinants of Health (SDOH) Interventions    Readmission Risk Interventions No flowsheet data found.

## 2019-05-20 MED ORDER — DICLOFENAC SODIUM 1 % TD GEL
2.0000 g | Freq: Four times a day (QID) | TRANSDERMAL | Status: DC
  Administered 2019-05-20 – 2019-05-21 (×5): 2 g via TOPICAL
  Filled 2019-05-20: qty 100

## 2019-05-20 MED ORDER — AMIODARONE HCL 200 MG PO TABS
200.0000 mg | ORAL_TABLET | Freq: Every day | ORAL | Status: DC
  Administered 2019-05-20 – 2019-05-21 (×2): 200 mg via ORAL
  Filled 2019-05-20 (×2): qty 1

## 2019-05-20 MED ORDER — DILTIAZEM HCL 30 MG PO TABS
30.0000 mg | ORAL_TABLET | Freq: Two times a day (BID) | ORAL | Status: DC
  Administered 2019-05-21: 10:00:00 30 mg via ORAL
  Filled 2019-05-20: qty 1

## 2019-05-20 NOTE — Progress Notes (Signed)
Per Dr. Doylene Canard, "Hold diltiazem if HR less than 80 and metoprolol if HR less than 60 bpm."

## 2019-05-20 NOTE — Progress Notes (Signed)
Pt's HR sustaining in 130's with jumps into the 150's. Pt asymptomatic and vitals stable. Dr. Merrilee Jansky office called and verbal orders received for a 1x dose diltiazem. Orders carried out and HR back under 120.

## 2019-05-20 NOTE — Progress Notes (Signed)
Pts HR dropped into 40's a couple times but back up into 60's right after per CCMD tech. Pt asymptomatic, BP stable. Dr. Doylene Canard paged and gave verbal order to hold 9pm dose of 25mg  hydralazine and 10pm dose of cardizem 30mg  for TONIGHT. Per Dr. Doylene Canard, if HR less than 60 prior to 10pm dose of metoprolol, page him for further orders. Will continue with plan of care.

## 2019-05-20 NOTE — Progress Notes (Signed)
Ref: Dixie Dials, MD   Subjective:  Right knee pain and swelling increased. Episodes of atrial fibrillation with RVR. VS stable. BP contol is improving.  Objective:  Vital Signs in the last 24 hours: Temp:  [97.7 F (36.5 C)-98.6 F (37 C)] 98.6 F (37 C) (07/15 0620) Pulse Rate:  [84-120] 86 (07/15 0923) Cardiac Rhythm: Atrial fibrillation (07/15 0700) Resp:  [18-20] 20 (07/15 0620) BP: (134-163)/(74-106) 152/74 (07/15 0923) SpO2:  [88 %-100 %] 100 % (07/15 1093)  Physical Exam: BP Readings from Last 1 Encounters:  05/20/19 (!) 152/74     Wt Readings from Last 1 Encounters:  05/17/19 55.2 kg    Weight change:  Body mass index is 19.06 kg/m. HEENT: [place]/AT, Eyes-Brown, Conjunctiva-Pink, Sclera-Non-icteric Neck: No JVD, No bruit, Trachea midline. Lungs:  Clear, Bilateral. Cardiac:  Regular rhythm, normal S1 and S2, no S3. II/VI systolic murmur. Abdomen:  Soft, non-tender. BS present. Extremities:  No edema present. No cyanosis. No clubbing. Right knee swelling. CNS: AxOx3, Cranial nerves grossly intact, moves all 4 extremities.  Skin: Warm and dry.   Intake/Output from previous day: 07/14 0701 - 07/15 0700 In: 630 [P.O.:630] Out: 650 [Urine:650]    Lab Results: BMET    Component Value Date/Time   NA 140 05/18/2019 0524   NA 140 05/17/2019 1641   NA 138 12/21/2018 0252   K 3.3 (L) 05/18/2019 0524   K 3.7 05/17/2019 1641   K 3.9 12/21/2018 0252   CL 107 05/18/2019 0524   CL 106 05/17/2019 1641   CL 105 12/21/2018 0252   CO2 22 05/18/2019 0524   CO2 22 05/17/2019 1641   CO2 25 12/21/2018 0252   GLUCOSE 94 05/18/2019 0524   GLUCOSE 91 05/17/2019 1641   GLUCOSE 107 (H) 12/21/2018 0252   BUN 32 (H) 05/18/2019 0524   BUN 40 (H) 05/17/2019 1641   BUN 30 (H) 12/21/2018 0252   CREATININE 1.36 (H) 05/18/2019 0524   CREATININE 1.74 (H) 05/17/2019 1641   CREATININE 1.61 (H) 12/21/2018 0252   CALCIUM 9.2 05/18/2019 0524   CALCIUM 9.6 05/17/2019 1641   CALCIUM  9.2 12/21/2018 0252   GFRNONAA 34 (L) 05/18/2019 0524   GFRNONAA 25 (L) 05/17/2019 1641   GFRNONAA 28 (L) 12/21/2018 0252   GFRAA 39 (L) 05/18/2019 0524   GFRAA 29 (L) 05/17/2019 1641   GFRAA 32 (L) 12/21/2018 0252   CBC    Component Value Date/Time   WBC 6.1 05/18/2019 0524   RBC 4.39 05/18/2019 0524   HGB 11.4 (L) 05/18/2019 0524   HCT 37.9 05/18/2019 0524   PLT 331 05/18/2019 0524   MCV 86.3 05/18/2019 0524   MCH 26.0 05/18/2019 0524   MCHC 30.1 05/18/2019 0524   RDW 16.6 (H) 05/18/2019 0524   LYMPHSABS 0.7 05/17/2019 1641   MONOABS 0.6 05/17/2019 1641   EOSABS 0.0 05/17/2019 1641   BASOSABS 0.1 05/17/2019 1641   HEPATIC Function Panel Recent Labs    06/22/18 1800 12/21/18 0252 05/17/19 1641  PROT 7.5 6.6 7.4   HEMOGLOBIN A1C No components found for: HGA1C,  MPG CARDIAC ENZYMES Lab Results  Component Value Date   CKTOTAL 79 05/17/2019   TROPONINI <0.03 12/21/2018   TROPONINI <0.03 12/21/2018   TROPONINI <0.03 12/20/2018   BNP No results for input(s): PROBNP in the last 8760 hours. TSH Recent Labs    12/21/18 0252  TSH 4.241   CHOLESTEROL No results for input(s): CHOL in the last 8760 hours.  Scheduled Meds: . amiodarone  200 mg Oral Daily  . aspirin  81 mg Oral Daily  . diclofenac sodium  2 g Topical QID  . diltiazem  30 mg Oral Q12H  . heparin  5,000 Units Subcutaneous Q8H  . hydrALAZINE  25 mg Oral BID  . metoprolol tartrate  25 mg Oral BID  . potassium chloride  10 mEq Oral Daily  . sodium chloride flush  3 mL Intravenous Q12H   Continuous Infusions: . sodium chloride     PRN Meds:.sodium chloride, acetaminophen, bisacodyl, nitroGLYCERIN, sodium chloride flush  Assessment/Plan: Atrial fibrillation with RVR HTN Right knee severe osteoarthritis Right hip severe osteoarthritis Weakness Gait abnormality  Add amiodarone. Apply ace bandage to right knee swelling.   LOS: 1 day   Time spent including chart review, lab review,  examination, discussion with patient : 30  min   Dixie Dials  MD  05/20/2019, 9:59 AM

## 2019-05-21 LAB — BASIC METABOLIC PANEL
Anion gap: 11 (ref 5–15)
BUN: 27 mg/dL — ABNORMAL HIGH (ref 8–23)
CO2: 22 mmol/L (ref 22–32)
Calcium: 8.9 mg/dL (ref 8.9–10.3)
Chloride: 97 mmol/L — ABNORMAL LOW (ref 98–111)
Creatinine, Ser: 1.36 mg/dL — ABNORMAL HIGH (ref 0.44–1.00)
GFR calc Af Amer: 39 mL/min — ABNORMAL LOW (ref >60)
GFR calc non Af Amer: 34 mL/min — ABNORMAL LOW (ref >60)
Glucose, Bld: 103 mg/dL — ABNORMAL HIGH (ref 70–99)
Potassium: 4 mmol/L (ref 3.5–5.1)
Sodium: 130 mmol/L — ABNORMAL LOW (ref 135–145)

## 2019-05-21 LAB — GLUCOSE, CAPILLARY: Glucose-Capillary: 99 mg/dL (ref 70–99)

## 2019-05-21 MED ORDER — FUROSEMIDE 20 MG PO TABS: 20.0000 mg | ORAL_TABLET | ORAL | 1 refills | Status: DC

## 2019-05-21 MED ORDER — DICLOFENAC SODIUM 1 % TD GEL: 2.0000 g | Freq: Four times a day (QID) | TRANSDERMAL | 1 refills | Status: DC

## 2019-05-21 MED ORDER — HYDRALAZINE HCL 25 MG PO TABS: 25.0000 mg | ORAL_TABLET | Freq: Two times a day (BID) | ORAL | 3 refills | Status: DC

## 2019-05-21 MED ORDER — POTASSIUM CHLORIDE CRYS ER 10 MEQ PO TBCR: 10.0000 meq | EXTENDED_RELEASE_TABLET | ORAL | 3 refills | Status: DC

## 2019-05-21 MED ORDER — BISACODYL 10 MG RE SUPP: 10.0000 mg | Freq: Every day | RECTAL | 0 refills | Status: DC | PRN

## 2019-05-21 MED ORDER — AMIODARONE HCL 200 MG PO TABS: 200.0000 mg | ORAL_TABLET | Freq: Every day | ORAL | 1 refills | Status: DC

## 2019-05-21 MED ORDER — DILTIAZEM HCL 30 MG PO TABS: 30.0000 mg | ORAL_TABLET | Freq: Every morning | ORAL | Status: DC

## 2019-05-21 NOTE — Discharge Summary (Signed)
Physician Discharge Summary  Patient ID: Shannon Cruz MRN: [id number] DOB/AGE: 05-26-1925 85 y.o.  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 X-ray of hip and knee was positive for severe osteoarthritis. CT of right knee showed medial tibial plateau depression. EKG showed atrial fibrillation with RVR. SHe was treated with metoprolol and diltiazem but had episodes of high grade AV block. Hydralazine was added for BP control and amiodarone was added to decrease diltiazem use and improve heart rate control. Furosemide and potassium were added as use on M-W-F.  Wheel chair will be used to improve ambulation. Bedside commode, home health nurse, PT and aide assistances are arranged as well as patient declined SNF placement. She will see me in 1 week.  Consults: cardiology  Significant Diagnostic Studies: labs: CBC is near normal. Electrolytes were near normal. Creatinine was 1.74 mg on admission and improved to 1.36 mg.   EKG: Atrial fibrillation with RVR  Chest x-ray: Moderate cardiomegaly. No acute disease.  Right hip x-ray: Severe osteoarthritis.  CT head: No acute  disease.  CT neck: multilevel degenerative disease without acute abnormality.  CT right knee: Severe tricompartmental osteoarthritis. Medial tibial plateau depression.  Treatments: cardiac meds: Aspirin, furosemide, potassium, metoprolol, diltiazem, amiodarone and hydralazine  Discharge Exam: Blood pressure (!) 150/75, pulse 78, temperature 97.9 F (36.6 C), temperature source Oral, resp. rate 16, height 5\' 7"  (1.702 m), weight 55.2 kg, SpO2 100 %. General appearance: alert, cooperative and appears stated age. Head: Normocephalic, atraumatic. Eyes: Brown eyes, pink conjunctiva, corneas clear.   Neck: No adenopathy, no carotid bruit, no JVD, supple, symmetrical, trachea midline and thyroid not enlarged. Resp: Clear to auscultation bilaterally. Cardio: Irregular rate and rhythm, S1, S2 normal, II/VI systolic murmur, no click, rub or gallop. GI: Soft, non-tender; bowel sounds normal; no organomegaly. Extremities: No cyanosis or clubbing. Right hip and knee tenderness. Mild right knee swelling. Skin: Warm and dry.  Neurologic: Alert and oriented X 3, normal strength and tone. Unable to ambulate due to knee pain.  Disposition: Discharge disposition: 01-Home or Self Care        Allergies as of 05/21/2019   No Known Allergies     Medication List    STOP taking these medications   nitroGLYCERIN 0.4 MG SL tablet Commonly known as: NITROSTAT     TAKE these medications   acetaminophen 325 MG tablet Commonly known as: TYLENOL Take 2 tablets (650 mg total) by mouth every 6 (six) hours as needed for mild pain (or Fever >/= 101).   amiodarone 200 MG tablet Commonly known as: PACERONE Take 1 tablet (200 mg total) by mouth daily.   aspirin 81 MG chewable tablet Chew 81 mg by mouth daily.   bisacodyl 10 MG suppository Commonly known as: DULCOLAX Place  1 suppository (10 mg total) rectally daily as needed for moderate constipation.   diclofenac sodium 1 % Gel Commonly known as:  VOLTAREN Apply 2 g topically 4 (four) times daily.   diltiazem 30 MG tablet Commonly known as: CARDIZEM Take 1 tablet (30 mg total) by mouth every morning. What changed: when to take this   furosemide 20 MG tablet Commonly known as: LASIX Take 1 tablet (20 mg total) by mouth every Monday, Wednesday, and Friday. Start taking on: May 22, 2019 What changed: when to take this   hydrALAZINE 25 MG tablet Commonly known as: APRESOLINE Take 1 tablet (25 mg total) by mouth 2 (two) times a day.   metoprolol tartrate 25 MG tablet Commonly known as: LOPRESSOR Take 1 tablet (25 mg total) by mouth 2 (two) times daily.   potassium chloride 10 MEQ tablet Commonly known as: K-DUR Take 1 tablet (10 mEq total) by mouth every Monday, Wednesday, and Friday. Start taking on: May 22, 2019 What changed: when to take this            Durable Medical Equipment  (From admission, onward)         Start     Ordered   05/19/19 1321  For home use only DME Bedside commode  Once    Question Answer Comment  Patient needs a bedside commode to treat with the following condition Osteoarthritis of both knees   Patient needs a bedside commode to treat with the following condition Osteoarthritis of hips, bilateral      05/18/19 1323   05/18/19 1325  For home use only DME standard manual wheelchair with seat cushion  Once    Comments: Patient suffers from Severe osteoarthritis of hips and knees which impairs their ability to perform daily activities like bathing in the home.  A walker will not resolve issue with performing activities of daily living. A wheelchair will allow patient to safely perform daily activities. Patient can safely propel the wheelchair in the home or has a caregiver who can provide assistance. Length of need Lifetime. Accessories: elevating leg rests (ELRs), wheel locks, extensions and anti-tippers.   05/18/19 1327         Follow-up Information    Dixie Dials, MD. Call in 1  week(s).   Specialty: Cardiology Contact information: [place] [address] [phone]           Time spent: Review of old chart, current chart, lab, x-ray, cardiac tests and discussion with patient over 60 minutes.  Signed: Birdie Riddle 05/21/2019, 9:50 AM

## 2019-05-21 NOTE — Progress Notes (Signed)
   05/21/19 1041  AVS Discharge Documentation  AVS Discharge Instructions Including Medications Provided to patient/caregiver  Name of Person Receiving AVS Discharge Instructions Including Medications [place]  Name of Clinician That Reviewed AVS Discharge Instructions Including Medications Rich Reining

## 2019-05-21 NOTE — Progress Notes (Signed)
Pt's son [name] returned my call and he is asking for [facility]. Will check to see what agency will take pt's insurance.

## 2019-05-21 NOTE — Progress Notes (Signed)
Called Shannon Cruz's son [name] several times with no answer, VM left. Spoke with Shannon Cruz who continued to decline HH. Shannon Cruz states that she has 2 daughters and 3 granddaughters to help her.

## 2019-05-21 NOTE — Progress Notes (Signed)
Physical Therapy Treatment Patient Details Name: Shannon Cruz MRN: [id number] DOB: [birth year] Today's Date: 05/21/2019    History of Present Illness 85 yo female admitted to ED on 7/12 for afib with RVR, s/p fall on 7/10 with R hip and RLE pain. CT reveals bilateral tibial plateau depression with severe tricompartmental OA with R jt effusion. CT head unremarkable. PMH includes osteopenia, HTN, edema, aflutter.    PT Comments    Pt with increased ambulation distance this session, still requiring very increased time to perform and lacks safety awareness with RW. Pt limited by severe bilateral knee varus with forward flexed posture in standing. Pt performed stair management with min assist for steadying and verbal cuing for safety, very slow and again required frequent cuing for safe step navigation. PT reviewed wheelchair management with pt, and had pt practice locking, moving leg rests, and propelling/steering wheelchair. Pt requires near constant verbal cues for safe wheelchair use, PT marked on the wheelchair with white tape where breaks and legrest levers are. PT told pt to make sure she locks wheelchair before moving into or out of wheelchair, as well as move legrests out of the way when sitting or standing. PT still strongly recommending SNF placement, but pt continues to refuse. Pt to d/c home today in the intermittent care of her son and neighbors.    Follow Up Recommendations  SNF;Supervision/Assistance - 24 hour     Equipment Recommendations  None recommended by PT    Recommendations for Other Services       Precautions / Restrictions Precautions Precautions: Fall Restrictions Weight Bearing Restrictions: No Other Position/Activity Restrictions: No WB noted in orders or in chart review    Mobility  Bed Mobility Overal bed mobility: Needs Assistance Bed Mobility: Sit to Supine       Sit to supine: Min guard;HOB elevated   General bed mobility comments: Pt up in chair  upon PT arrival to room, min guard for return to supine for safety. Increased time to lift LEs into bed.  Transfers Overall transfer level: Needs assistance Equipment used: Rolling walker (2 wheeled) Transfers: Sit to/from Stand Sit to Stand: Min assist;Mod assist         General transfer comment: Min assist to rise from recliner for power up, steadying. PT trying to let pt do all that she could and not assist since pt stating she is going home, but pt states "come and and help me" during power up. Pt required mod assist for power up from wheelchair during wheelchair training  Ambulation/Gait Ambulation/Gait assistance: Min assist Gait Distance (Feet): 60 Feet Assistive device: Rolling walker (2 wheeled) Gait Pattern/deviations: Step-through pattern;Trunk flexed;Drifts right/left;Decreased stride length;Wide base of support Gait velocity: very decr   General Gait Details: Min assist for steadying, guiding RW, frequent VC for safe placement in RW and upright posture. Pt with severe bilateral knee varus in stance phase.   Stairs Stairs: Yes Stairs assistance: Min assist;+2 safety/equipment Stair Management: Two rails;Forwards;Step to pattern Number of Stairs: 3 General stair comments: Min assist for steadying, pt with step-to pattern with especially increased time to descend steps. Pt standing too far away from steps when being ascent, required VC to correct.   Information systems manager mobility: Yes Wheelchair propulsion: Both upper extremities Wheelchair parts: Supervision/cueing Distance: 5 Wheelchair Assistance Details (indicate cue type and reason): VC for steering L and R, forward and backward propulsion. PT gave pt inservice on locking wheelchair and moving legrests out of way before pt sitting,  followed by replacing legrests after sitting. Pt required assist to correctly lock and move leg rests, even with frequent verbal cuing.  Modified Rankin  (Stroke Patients Only)       Balance Overall balance assessment: Needs assistance;History of Falls(pt reports 4 falls in the past year)   Sitting balance-Leahy Scale: Fair     Standing balance support: Bilateral upper extremity supported Standing balance-Leahy Scale: Poor Standing balance comment: reliant on external UE support                            Cognition Arousal/Alertness: Awake/alert Behavior During Therapy: WFL for tasks assessed/performed Overall Cognitive Status: Within Functional Limits for tasks assessed                                 General Comments: Pt lacking safety awareness with mobility, especially with w/c training      Exercises      General Comments General comments (skin integrity, edema, etc.): ace wrap applied to RLE      Pertinent Vitals/Pain Pain Assessment: 0-10 Pain Score: 0-No pain Pain Intervention(s): Monitored during session;Limited activity within patient's tolerance    Home Living                      Prior Function            PT Goals (current goals can now be found in the care plan section) Acute Rehab PT Goals Patient Stated Goal: go home PT Goal Formulation: With patient Time For Goal Achievement: 06/01/19 Potential to Achieve Goals: Fair Progress towards PT goals: Progressing toward goals    Frequency    Min 2X/week      PT Plan Current plan remains appropriate    Co-evaluation              AM-PAC PT "6 Clicks" Mobility   Outcome Measure  Help needed turning from your back to your side while in a flat bed without using bedrails?: A Lot Help needed moving from lying on your back to sitting on the side of a flat bed without using bedrails?: A Lot Help needed moving to and from a bed to a chair (including a wheelchair)?: A Lot Help needed standing up from a chair using your arms (e.g., wheelchair or bedside chair)?: A Lot Help needed to walk in hospital room?: A  Little Help needed climbing 3-5 steps with a railing? : A Little 6 Click Score: 14    End of Session Equipment Utilized During Treatment: Gait belt Activity Tolerance: Patient limited by fatigue Patient left: with call bell/phone within reach;in bed;with bed alarm set Nurse Communication: Mobility status PT Visit Diagnosis: Other abnormalities of gait and mobility (R26.89);Difficulty in walking, not elsewhere classified (R26.2);History of falling (Z91.81)     Time: 1478-2956 PT Time Calculation (min) (ACUTE ONLY): 44 min  Charges:  $Gait Training: 23-37 mins $Wheel Chair Management: 8-22 mins                    Julien Girt, PT Acute Rehabilitation Services Pager [phone]  Office [phone]  Lucresia Simic D Priscille Shadduck 05/21/2019, 1:20 PM

## 2019-05-22 NOTE — Progress Notes (Addendum)
Spoke with pt's son after 5PM on yesterday. Remind son Shannon Cruz that pt had declined HH, however would continue to search for [facility] agency. Shannon Cruz, [facility], [facility], [facility], [facility] and [place]. Received call from [facility] today stating unable to take pt related to staffing. Unable to find [facility] for pt. Son is aware and encouraged son to hire and agency to come into home. Pt stated that she will have help at home.  Also encouraged son to get an order from PCP to try OP PT for pt.

## 2019-06-18 DIAGNOSIS — M25562 Pain in left knee: Secondary | ICD-10-CM | POA: Diagnosis not present

## 2019-06-18 DIAGNOSIS — R072 Precordial pain: Secondary | ICD-10-CM | POA: Diagnosis not present

## 2019-06-18 DIAGNOSIS — I4821 Permanent atrial fibrillation: Secondary | ICD-10-CM | POA: Diagnosis not present

## 2019-06-18 DIAGNOSIS — M25561 Pain in right knee: Secondary | ICD-10-CM | POA: Diagnosis not present

## 2019-06-19 DIAGNOSIS — I4891 Unspecified atrial fibrillation: Secondary | ICD-10-CM | POA: Diagnosis not present

## 2019-06-24 DIAGNOSIS — M25561 Pain in right knee: Secondary | ICD-10-CM | POA: Diagnosis not present

## 2019-06-24 DIAGNOSIS — M25562 Pain in left knee: Secondary | ICD-10-CM | POA: Diagnosis not present

## 2019-06-24 DIAGNOSIS — I4821 Permanent atrial fibrillation: Secondary | ICD-10-CM | POA: Diagnosis not present

## 2019-06-24 DIAGNOSIS — R072 Precordial pain: Secondary | ICD-10-CM | POA: Diagnosis not present

## 2019-07-01 ENCOUNTER — Emergency Department (HOSPITAL_COMMUNITY): Payer: Medicare Other

## 2019-07-01 ENCOUNTER — Inpatient Hospital Stay (HOSPITAL_COMMUNITY)
Admission: EM | Admit: 2019-07-01 | Discharge: 2019-07-07 | DRG: 065 | Disposition: A | Discharge status: Hospice | Payer: Medicare Other | Attending: Internal Medicine | Admitting: Internal Medicine

## 2019-07-01 ENCOUNTER — Inpatient Hospital Stay (HOSPITAL_COMMUNITY): Payer: Medicare Other

## 2019-07-01 ENCOUNTER — Encounter (HOSPITAL_COMMUNITY): Payer: Self-pay | Admitting: Emergency Medicine

## 2019-07-01 DIAGNOSIS — I081 Rheumatic disorders of both mitral and tricuspid valves: Secondary | ICD-10-CM | POA: Diagnosis present

## 2019-07-01 DIAGNOSIS — I6602 Occlusion and stenosis of left middle cerebral artery: Secondary | ICD-10-CM | POA: Diagnosis not present

## 2019-07-01 DIAGNOSIS — I519 Heart disease, unspecified: Secondary | ICD-10-CM | POA: Diagnosis not present

## 2019-07-01 DIAGNOSIS — W1830XA Fall on same level, unspecified, initial encounter: Secondary | ICD-10-CM | POA: Diagnosis present

## 2019-07-01 DIAGNOSIS — I161 Hypertensive emergency: Secondary | ICD-10-CM | POA: Diagnosis not present

## 2019-07-01 DIAGNOSIS — M1990 Unspecified osteoarthritis, unspecified site: Secondary | ICD-10-CM

## 2019-07-01 DIAGNOSIS — I700 Atherosclerosis of aorta: Secondary | ICD-10-CM | POA: Diagnosis not present

## 2019-07-01 DIAGNOSIS — M1611 Unilateral primary osteoarthritis, right hip: Secondary | ICD-10-CM | POA: Diagnosis present

## 2019-07-01 DIAGNOSIS — N390 Urinary tract infection, site not specified: Secondary | ICD-10-CM | POA: Diagnosis not present

## 2019-07-01 DIAGNOSIS — G8191 Hemiplegia, unspecified affecting right dominant side: Secondary | ICD-10-CM | POA: Diagnosis present

## 2019-07-01 DIAGNOSIS — Y92000 Kitchen of unspecified non-institutional (private) residence as  the place of occurrence of the external cause: Secondary | ICD-10-CM

## 2019-07-01 DIAGNOSIS — R627 Adult failure to thrive: Secondary | ICD-10-CM | POA: Diagnosis present

## 2019-07-01 DIAGNOSIS — S40011A Contusion of right shoulder, initial encounter: Secondary | ICD-10-CM | POA: Diagnosis present

## 2019-07-01 DIAGNOSIS — Z79899 Other long term (current) drug therapy: Secondary | ICD-10-CM

## 2019-07-01 DIAGNOSIS — S79911A Unspecified injury of right hip, initial encounter: Secondary | ICD-10-CM | POA: Diagnosis not present

## 2019-07-01 DIAGNOSIS — R404 Transient alteration of awareness: Secondary | ICD-10-CM | POA: Diagnosis not present

## 2019-07-01 DIAGNOSIS — I4821 Permanent atrial fibrillation: Secondary | ICD-10-CM | POA: Diagnosis present

## 2019-07-01 DIAGNOSIS — R4701 Aphasia: Secondary | ICD-10-CM | POA: Diagnosis present

## 2019-07-01 DIAGNOSIS — W19XXXA Unspecified fall, initial encounter: Secondary | ICD-10-CM | POA: Diagnosis not present

## 2019-07-01 DIAGNOSIS — S0083XA Contusion of other part of head, initial encounter: Secondary | ICD-10-CM | POA: Diagnosis present

## 2019-07-01 DIAGNOSIS — R413 Other amnesia: Secondary | ICD-10-CM | POA: Diagnosis present

## 2019-07-01 DIAGNOSIS — I4891 Unspecified atrial fibrillation: Secondary | ICD-10-CM | POA: Diagnosis not present

## 2019-07-01 DIAGNOSIS — R4020 Unspecified coma: Secondary | ICD-10-CM | POA: Diagnosis not present

## 2019-07-01 DIAGNOSIS — I340 Nonrheumatic mitral (valve) insufficiency: Secondary | ICD-10-CM | POA: Diagnosis not present

## 2019-07-01 DIAGNOSIS — R531 Weakness: Secondary | ICD-10-CM | POA: Diagnosis not present

## 2019-07-01 DIAGNOSIS — I361 Nonrheumatic tricuspid (valve) insufficiency: Secondary | ICD-10-CM | POA: Diagnosis not present

## 2019-07-01 DIAGNOSIS — Z7401 Bed confinement status: Secondary | ICD-10-CM

## 2019-07-01 DIAGNOSIS — I452 Bifascicular block: Secondary | ICD-10-CM | POA: Diagnosis present

## 2019-07-01 DIAGNOSIS — I10 Essential (primary) hypertension: Secondary | ICD-10-CM | POA: Diagnosis not present

## 2019-07-01 DIAGNOSIS — Z515 Encounter for palliative care: Secondary | ICD-10-CM | POA: Diagnosis not present

## 2019-07-01 DIAGNOSIS — B9620 Unspecified Escherichia coli [E. coli] as the cause of diseases classified elsewhere: Secondary | ICD-10-CM | POA: Diagnosis present

## 2019-07-01 DIAGNOSIS — I63512 Cerebral infarction due to unspecified occlusion or stenosis of left middle cerebral artery: Principal | ICD-10-CM | POA: Diagnosis present

## 2019-07-01 DIAGNOSIS — Z66 Do not resuscitate: Secondary | ICD-10-CM | POA: Diagnosis present

## 2019-07-01 DIAGNOSIS — G459 Transient cerebral ischemic attack, unspecified: Secondary | ICD-10-CM | POA: Diagnosis not present

## 2019-07-01 DIAGNOSIS — R1310 Dysphagia, unspecified: Secondary | ICD-10-CM | POA: Diagnosis present

## 2019-07-01 DIAGNOSIS — I63312 Cerebral infarction due to thrombosis of left middle cerebral artery: Secondary | ICD-10-CM | POA: Diagnosis not present

## 2019-07-01 DIAGNOSIS — I6621 Occlusion and stenosis of right posterior cerebral artery: Secondary | ICD-10-CM | POA: Diagnosis not present

## 2019-07-01 DIAGNOSIS — H05221 Edema of right orbit: Secondary | ICD-10-CM | POA: Diagnosis present

## 2019-07-01 DIAGNOSIS — I63412 Cerebral infarction due to embolism of left middle cerebral artery: Secondary | ICD-10-CM | POA: Diagnosis not present

## 2019-07-01 DIAGNOSIS — M2550 Pain in unspecified joint: Secondary | ICD-10-CM | POA: Diagnosis not present

## 2019-07-01 DIAGNOSIS — I1310 Hypertensive heart and chronic kidney disease without heart failure, with stage 1 through stage 4 chronic kidney disease, or unspecified chronic kidney disease: Secondary | ICD-10-CM | POA: Diagnosis not present

## 2019-07-01 DIAGNOSIS — M1711 Unilateral primary osteoarthritis, right knee: Secondary | ICD-10-CM | POA: Diagnosis present

## 2019-07-01 DIAGNOSIS — R400 Somnolence: Secondary | ICD-10-CM | POA: Diagnosis not present

## 2019-07-01 DIAGNOSIS — M4802 Spinal stenosis, cervical region: Secondary | ICD-10-CM | POA: Diagnosis not present

## 2019-07-01 DIAGNOSIS — M5030 Other cervical disc degeneration, unspecified cervical region: Secondary | ICD-10-CM | POA: Diagnosis present

## 2019-07-01 DIAGNOSIS — Z993 Dependence on wheelchair: Secondary | ICD-10-CM

## 2019-07-01 DIAGNOSIS — Z7982 Long term (current) use of aspirin: Secondary | ICD-10-CM

## 2019-07-01 DIAGNOSIS — S0001XA Abrasion of scalp, initial encounter: Secondary | ICD-10-CM | POA: Diagnosis not present

## 2019-07-01 DIAGNOSIS — R4189 Other symptoms and signs involving cognitive functions and awareness: Secondary | ICD-10-CM

## 2019-07-01 DIAGNOSIS — N183 Chronic kidney disease, stage 3 (moderate): Secondary | ICD-10-CM | POA: Diagnosis present

## 2019-07-01 DIAGNOSIS — E785 Hyperlipidemia, unspecified: Secondary | ICD-10-CM | POA: Diagnosis present

## 2019-07-01 DIAGNOSIS — Z20828 Contact with and (suspected) exposure to other viral communicable diseases: Secondary | ICD-10-CM | POA: Diagnosis present

## 2019-07-01 DIAGNOSIS — S7001XA Contusion of right hip, initial encounter: Secondary | ICD-10-CM | POA: Diagnosis present

## 2019-07-01 DIAGNOSIS — I6502 Occlusion and stenosis of left vertebral artery: Secondary | ICD-10-CM | POA: Diagnosis not present

## 2019-07-01 DIAGNOSIS — R29719 NIHSS score 19: Secondary | ICD-10-CM | POA: Diagnosis present

## 2019-07-01 DIAGNOSIS — Z03818 Encounter for observation for suspected exposure to other biological agents ruled out: Secondary | ICD-10-CM | POA: Diagnosis not present

## 2019-07-01 DIAGNOSIS — Z87891 Personal history of nicotine dependence: Secondary | ICD-10-CM

## 2019-07-01 DIAGNOSIS — R000 Tachycardia, unspecified: Secondary | ICD-10-CM | POA: Diagnosis not present

## 2019-07-01 DIAGNOSIS — I6521 Occlusion and stenosis of right carotid artery: Secondary | ICD-10-CM | POA: Diagnosis not present

## 2019-07-01 DIAGNOSIS — Z791 Long term (current) use of non-steroidal anti-inflammatories (NSAID): Secondary | ICD-10-CM

## 2019-07-01 DIAGNOSIS — G936 Cerebral edema: Secondary | ICD-10-CM | POA: Diagnosis not present

## 2019-07-01 DIAGNOSIS — I639 Cerebral infarction, unspecified: Secondary | ICD-10-CM | POA: Diagnosis present

## 2019-07-01 DIAGNOSIS — Z681 Body mass index (BMI) 19 or less, adult: Secondary | ICD-10-CM | POA: Diagnosis not present

## 2019-07-01 LAB — COMPREHENSIVE METABOLIC PANEL
ALT: 15 U/L (ref 0–44)
AST: 46 U/L — ABNORMAL HIGH (ref 15–41)
Albumin: 3.3 g/dL — ABNORMAL LOW (ref 3.5–5.0)
Alkaline Phosphatase: 206 U/L — ABNORMAL HIGH (ref 38–126)
Anion gap: 14 (ref 5–15)
BUN: 20 mg/dL (ref 8–23)
CO2: 21 mmol/L — ABNORMAL LOW (ref 22–32)
Calcium: 9.5 mg/dL (ref 8.9–10.3)
Chloride: 103 mmol/L (ref 98–111)
Creatinine, Ser: 1.39 mg/dL — ABNORMAL HIGH (ref 0.44–1.00)
GFR calc Af Amer: 38 mL/min — ABNORMAL LOW (ref >60)
GFR calc non Af Amer: 33 mL/min — ABNORMAL LOW (ref >60)
Glucose, Bld: 115 mg/dL — ABNORMAL HIGH (ref 70–99)
Potassium: 3.5 mmol/L (ref 3.5–5.1)
Sodium: 138 mmol/L (ref 135–145)
Total Bilirubin: 1.4 mg/dL — ABNORMAL HIGH (ref 0.3–1.2)
Total Protein: 7.4 g/dL (ref 6.5–8.1)

## 2019-07-01 LAB — CBC WITH DIFFERENTIAL/PLATELET
Abs Immature Granulocytes: 0.06 10*3/uL (ref 0.00–0.07)
Basophils Absolute: 0 10*3/uL (ref 0.0–0.1)
Basophils Relative: 0 %
Eosinophils Absolute: 0 10*3/uL (ref 0.0–0.5)
Eosinophils Relative: 0 %
HCT: 46.2 % — ABNORMAL HIGH (ref 36.0–46.0)
Hemoglobin: 14.3 g/dL (ref 12.0–15.0)
Immature Granulocytes: 1 %
Lymphocytes Relative: 4 %
Lymphs Abs: 0.5 10*3/uL — ABNORMAL LOW (ref 0.7–4.0)
MCH: 25.8 pg — ABNORMAL LOW (ref 26.0–34.0)
MCHC: 31 g/dL (ref 30.0–36.0)
MCV: 83.4 fL (ref 80.0–100.0)
Monocytes Absolute: 0.8 10*3/uL (ref 0.1–1.0)
Monocytes Relative: 7 %
Neutro Abs: 10.9 10*3/uL — ABNORMAL HIGH (ref 1.7–7.7)
Neutrophils Relative %: 88 %
Platelets: 368 10*3/uL (ref 150–400)
RBC: 5.54 MIL/uL — ABNORMAL HIGH (ref 3.87–5.11)
RDW: 18 % — ABNORMAL HIGH (ref 11.5–15.5)
WBC: 12.3 10*3/uL — ABNORMAL HIGH (ref 4.0–10.5)
nRBC: 0 % (ref 0.0–0.2)

## 2019-07-01 LAB — CK: Total CK: 627 U/L — ABNORMAL HIGH (ref 38–234)

## 2019-07-01 LAB — SARS CORONAVIRUS 2 (TAT 6-24 HRS): SARS Coronavirus 2: NEGATIVE

## 2019-07-01 LAB — PROTIME-INR
INR: 1.1 (ref 0.8–1.2)
Prothrombin Time: 14.4 seconds (ref 11.4–15.2)

## 2019-07-01 LAB — APTT: aPTT: 21 seconds — ABNORMAL LOW (ref 24–36)

## 2019-07-01 MED ORDER — ACETAMINOPHEN 650 MG RE SUPP
650.0000 mg | RECTAL | Status: DC | PRN
  Administered 2019-07-03: 22:00:00 650 mg via RECTAL
  Filled 2019-07-01: qty 1

## 2019-07-01 MED ORDER — SODIUM CHLORIDE 0.9 % IV BOLUS
500.0000 mL | Freq: Once | INTRAVENOUS | Status: AC
  Administered 2019-07-01: 13:00:00 500 mL via INTRAVENOUS

## 2019-07-01 MED ORDER — ASPIRIN 300 MG RE SUPP
300.0000 mg | Freq: Every day | RECTAL | Status: DC
  Administered 2019-07-01 – 2019-07-03 (×3): 300 mg via RECTAL
  Filled 2019-07-01 (×3): qty 1

## 2019-07-01 MED ORDER — GADOBUTROL 1 MMOL/ML IV SOLN
5.0000 mL | Freq: Once | INTRAVENOUS | Status: AC | PRN
  Administered 2019-07-01: 19:00:00 5 mL via INTRAVENOUS

## 2019-07-01 MED ORDER — SODIUM CHLORIDE 0.9 % IV SOLN
INTRAVENOUS | Status: DC
  Administered 2019-07-01 – 2019-07-03 (×3): via INTRAVENOUS

## 2019-07-01 MED ORDER — ENOXAPARIN SODIUM 40 MG/0.4ML ~~LOC~~ SOLN
40.0000 mg | SUBCUTANEOUS | Status: DC
  Administered 2019-07-01: 21:00:00 40 mg via SUBCUTANEOUS
  Filled 2019-07-01: qty 0.4

## 2019-07-01 MED ORDER — SENNOSIDES-DOCUSATE SODIUM 8.6-50 MG PO TABS: 1.0000 | ORAL_TABLET | Freq: Every evening | ORAL | Status: DC | PRN

## 2019-07-01 MED ORDER — ACETAMINOPHEN 160 MG/5ML PO SOLN: 650.0000 mg | ORAL | Status: DC | PRN

## 2019-07-01 MED ORDER — DILTIAZEM HCL-DEXTROSE 100-5 MG/100ML-% IV SOLN (PREMIX)
5.0000 mg/h | INTRAVENOUS | Status: DC
  Administered 2019-07-01 – 2019-07-02 (×2): 5 mg/h via INTRAVENOUS
  Administered 2019-07-02: 07:00:00 12.5 mg/h via INTRAVENOUS
  Administered 2019-07-03 – 2019-07-04 (×3): 10 mg/h via INTRAVENOUS
  Filled 2019-07-01 (×10): qty 100

## 2019-07-01 MED ORDER — STROKE: EARLY STAGES OF RECOVERY BOOK
Freq: Once | Status: DC
  Filled 2019-07-01: qty 1

## 2019-07-01 MED ORDER — ACETAMINOPHEN 325 MG PO TABS: 650.0000 mg | ORAL_TABLET | ORAL | Status: DC | PRN

## 2019-07-01 MED ORDER — DICLOFENAC SODIUM 1 % TD GEL: 2.0000 g | Freq: Four times a day (QID) | TRANSDERMAL | Status: DC

## 2019-07-01 NOTE — ED Notes (Signed)
Patient transported to MRI 

## 2019-07-01 NOTE — ED Provider Notes (Signed)
[place] EMERGENCY DEPARTMENT Provider Note   CSN: [id number] Arrival date & time: 07/01/19  1236     History   Chief Complaint Chief Complaint  Patient presents with  . Fall  . AMS    HPI Shannon Cruz is a 85 y.o. female.     HPI Patient lives at home by herself.  Found by neighbors.  Last seen 2 days ago.  Patient is nonverbal at this point.  Unable to contribute to history.  Level 5 caveat applies. Past Medical History:  Diagnosis Date  . Arthritis   . H/O diastolic dysfunction   . Heart palpitations   . Hypertension   . RBBB (right bundle branch block) 11/20/2010   Echo - EF >55%; flow pattern suggestive of impaired LV relaxation, proximal septal thickening noted; mitral valve mildly thickened; mild mitral annular calcification; aortic root sclerosis/calcification    Patient Active Problem List   Diagnosis Date Noted  . Unresponsiveness   . Failure to thrive in adult   . Palliative care by specialist   . DNR (do not resuscitate)   . Dysphagia   . CVA (cerebral vascular accident) ([place]) 07/01/2019  . Osteoarthritis 07/01/2019  . Atrial fibrillation with RVR ([place]) 05/17/2019  . Atrial flutter ([place]) 12/20/2018  . Bilateral leg edema 06/24/2017  . Abdominal pain 06/24/2017  . Atrial bigeminy 10/09/2013  . HTN (hypertension) 03/19/2013  . RBBB (right bundle branch block) 03/19/2013  . Left shoulder pain 03/19/2013    History reviewed. No pertinent surgical history.   OB History    Gravida      Para      Term      Preterm      AB      Living  3     SAB      TAB      Ectopic      Multiple      Live Births               Home Medications    Prior to Admission medications   Medication Sig Start Date End Date Taking? Authorizing Provider  acetaminophen (TYLENOL) 325 MG tablet Take 2 tablets (650 mg total) by mouth every 6 (six) hours as needed for mild pain (or Fever >/= 101). 12/23/18  Yes Hosie Poisson, MD   aspirin 81 MG chewable tablet Chew 81 mg by mouth daily.   Yes [provider]  bisacodyl (DULCOLAX) 10 MG suppository Place 1 suppository (10 mg total) rectally daily as needed for moderate constipation. 05/21/19  Yes Dixie Dials, MD  diclofenac sodium (VOLTAREN) 1 % GEL Apply 2 g topically 4 (four) times daily. 05/21/19  Yes Dixie Dials, MD  diltiazem (CARDIZEM) 30 MG tablet Take 1 tablet (30 mg total) by mouth every morning. Patient taking differently: Take 30-60 mg by mouth every morning.  05/21/19  Yes Dixie Dials, MD  furosemide (LASIX) 20 MG tablet Take 1 tablet (20 mg total) by mouth every Monday, Wednesday, and Friday. 05/22/19  Yes Dixie Dials, MD  hydrALAZINE (APRESOLINE) 25 MG tablet Take 1 tablet (25 mg total) by mouth 2 (two) times a day. 05/21/19  Yes Dixie Dials, MD  mirtazapine (REMERON) 7.5 MG tablet Take 7.5 mg by mouth at bedtime.    Yes [provider]  potassium chloride (K-DUR) 10 MEQ tablet Take 1 tablet (10 mEq total) by mouth every Monday, Wednesday, and Friday. 05/22/19  Yes Dixie Dials, MD  amiodarone (PACERONE) 200 MG tablet  Take 1 tablet (200 mg total) by mouth daily. 05/21/19   Dixie Dials, MD  diltiazem (CARDIZEM) 60 MG tablet Take 60 mg by mouth 2 (two) times daily. Had follow-up Appt. With Dr to decide if qd or bid 06/19/19   [provider]  metoprolol tartrate (LOPRESSOR) 25 MG tablet Take 1 tablet (25 mg total) by mouth 2 (two) times daily. Patient not taking: Reported on 07/02/2019 06/26/17   Dixie Dials, MD    Family History No family history on file.  Social History Social History   Tobacco Use  . Smoking status: Former Smoker    Quit date: 11/05/1982    Years since quitting: 36.6  . Smokeless tobacco: Never Used  Substance Use Topics  . Alcohol use: No  . Drug use: No     Allergies   Patient has no known allergies.   Review of Systems Review of Systems  Unable to perform ROS: Patient nonverbal      Physical Exam Updated Vital Signs BP (!) 105/94 (BP Location: Right Arm)   Pulse (!) 108   Temp 98.1 F (36.7 C) (Axillary)   Resp (!) 21   Wt 55.7 kg   SpO2 90%   BMI 19.23 kg/m   Physical Exam Vitals signs and nursing note reviewed.  Constitutional:      Appearance: She is well-developed.  HENT:     Head: Normocephalic.     Comments: Right temporal scalp abrasion with edema around the right eye.  Does not appear to have any ocular trauma.  Midface is stable. Eyes:     Comments: Pupils are pinpoint.  Fixed left gaze deviation  Neck:     Comments: Cervical collar in place. Cardiovascular:     Rate and Rhythm: Tachycardia present. Rhythm irregular.  Pulmonary:     Effort: Pulmonary effort is normal. No respiratory distress.     Breath sounds: Normal breath sounds. No stridor. No wheezing, rhonchi or rales.  Chest:     Chest wall: No tenderness.  Abdominal:     General: Bowel sounds are normal.     Palpations: Abdomen is soft.     Tenderness: There is no abdominal tenderness. There is no guarding or rebound.  Musculoskeletal: Normal range of motion.        General: Swelling present. No tenderness or deformity.     Comments: Patient has contusion and swelling to the right hip.  Distal pulses intact.  Skin:    General: Skin is warm and dry.     Capillary Refill: Capillary refill takes less than 2 seconds.     Findings: No erythema or rash.  Neurological:     Mental Status: She is alert.     Comments: Patient minimally moving all extremities except the right upper.  She is nonverbal.  Psychiatric:        Behavior: Behavior normal.      ED Treatments / Results  Labs (all labs ordered are listed, but only abnormal results are displayed) Labs Reviewed  URINE CULTURE - Abnormal; Notable for the following components:      Result Value   Culture   (*)    Value: >=100,000 COLONIES/mL ESCHERICHIA COLI 50,000 COLONIES/mL PROTEUS MIRABILIS    Organism ID, Bacteria  ESCHERICHIA COLI (*)    Organism ID, Bacteria PROTEUS MIRABILIS (*)    All other components within normal limits  CBC WITH DIFFERENTIAL/PLATELET - Abnormal; Notable for the following components:   WBC 12.3 (*)    RBC  5.54 (*)    HCT 46.2 (*)    MCH 25.8 (*)    RDW 18.0 (*)    Neutro Abs 10.9 (*)    Lymphs Abs 0.5 (*)    All other components within normal limits  COMPREHENSIVE METABOLIC PANEL - Abnormal; Notable for the following components:   CO2 21 (*)    Glucose, Bld 115 (*)    Creatinine, Ser 1.39 (*)    Albumin 3.3 (*)    AST 46 (*)    Alkaline Phosphatase 206 (*)    Total Bilirubin 1.4 (*)    GFR calc non Af Amer 33 (*)    GFR calc Af Amer 38 (*)    All other components within normal limits  CK - Abnormal; Notable for the following components:   Total CK 627 (*)    All other components within normal limits  APTT - Abnormal; Notable for the following components:   aPTT 21 (*)    All other components within normal limits  HEMOGLOBIN A1C - Abnormal; Notable for the following components:   Hgb A1c MFr Bld 6.3 (*)    All other components within normal limits  CBC WITH DIFFERENTIAL/PLATELET - Abnormal; Notable for the following components:   WBC 13.4 (*)    RBC 5.33 (*)    MCH 25.9 (*)    RDW 17.9 (*)    Neutro Abs 11.2 (*)    Lymphs Abs 0.6 (*)    Monocytes Absolute 1.4 (*)    All other components within normal limits  COMPREHENSIVE METABOLIC PANEL - Abnormal; Notable for the following components:   Glucose, Bld 111 (*)    Creatinine, Ser 1.24 (*)    Albumin 3.0 (*)    Alkaline Phosphatase 172 (*)    Total Bilirubin 1.5 (*)    GFR calc non Af Amer 38 (*)    GFR calc Af Amer 44 (*)    All other components within normal limits  LIPID PANEL - Abnormal; Notable for the following components:   LDL Cholesterol 122 (*)    All other components within normal limits  GLUCOSE, CAPILLARY - Abnormal; Notable for the following components:   Glucose-Capillary 104 (*)    All  other components within normal limits  URINALYSIS, ROUTINE W REFLEX MICROSCOPIC - Abnormal; Notable for the following components:   Color, Urine AMBER (*)    APPearance CLOUDY (*)    Hgb urine dipstick SMALL (*)    Ketones, ur 5 (*)    Protein, ur >=300 (*)    Leukocytes,Ua LARGE (*)    WBC, UA >50 (*)    Bacteria, UA MANY (*)    All other components within normal limits  SARS CORONAVIRUS 2 (TAT 6-12 HRS)  PROTIME-INR  CBG MONITORING, ED    EKG EKG Interpretation  Date/Time:  Wednesday July 01 2019 13:00:07 EDT Ventricular Rate:  102 PR Interval:    QRS Duration: 147 QT Interval:  387 QTC Calculation: 505 R Axis:   -72 Text Interpretation:  Atrial fibrillation RBBB and LAFB Confirmed by Julianne Rice [phone]) on 07/01/2019 1:13:49 PM Also confirmed by Julianne Rice [phone]), editor Hattie Perch ([id number])  on 07/02/2019 8:39:33 AM   Radiology No results found.  Procedures Procedures (including critical care time)  Medications Ordered in ED Medications   stroke: mapping our early stages of recovery book (has no administration in time range)  acetaminophen (TYLENOL) tablet 650 mg ( Oral See Alternative 07/03/19 2135)    Or  acetaminophen (  TYLENOL) solution 650 mg ( Per Tube See Alternative 07/03/19 2135)    Or  acetaminophen (TYLENOL) suppository 650 mg (650 mg Rectal Given 07/03/19 2135)  0.9 %  sodium chloride infusion ( Intravenous Rate/Dose Change 07/04/19 1547)  MEDLINE mouth rinse (15 mLs Mouth Rinse Given 07/06/19 0905)  morphine CONCENTRATE 10 MG/0.5ML oral solution 5 mg (has no administration in time range)  LORazepam (ATIVAN) tablet 1 mg (has no administration in time range)  sodium chloride 0.9 % bolus 500 mL (0 mLs Intravenous Stopped 07/01/19 1417)  gadobutrol (GADAVIST) 1 MMOL/ML injection 5 mL (5 mLs Intravenous Contrast Given 07/01/19 1904)     Initial Impression / Assessment and Plan / ED Course  I have reviewed the triage vital signs and the  nursing notes.  Pertinent labs & imaging results that were available during my care of the patient were reviewed by me and considered in my medical decision making (see chart for details).        Patient with atrial fibrillation and RVR.  She is not on anticoagulants.  Suspect likely stroke with prolonged downtime. Signed out oncoming emergency physician pending CT and laboratory work-up. Final Clinical Impressions(s) / ED Diagnoses   Final diagnoses:  Fall    ED Discharge Orders    None       Julianne Rice, MD 07/06/19 2206

## 2019-07-01 NOTE — ED Notes (Signed)
Pt. had pericare, a bed bath, and was placed on purewick.

## 2019-07-01 NOTE — ED Notes (Signed)
Attempted report 

## 2019-07-01 NOTE — ED Triage Notes (Addendum)
Pt found by neighbor lying on the floor on right side in fetal position neighbors last saw her 2 days ago. Abrasion to right forehead & eyes are swollen w/dc. Enter rotation of right hip and bruise on right shoulder.   Neighbor said her son usually helps her out and his number is in her chart.

## 2019-07-01 NOTE — H&P (Signed)
History and Physical    Shannon Cruz JWJ:[id number] DOB: [birth year] DOA: 07/01/2019  Referring MD/NP/PA: Madalyn Rob, MD PCP: Dixie Dials, MD  Patient coming from: Home via EMS  Chief Complaint: Found down  I have personally briefly reviewed patient's old medical records in [place]   HPI: Shannon Cruz is a 85 y.o. female with medical history significant of hypertension, atrial flutter with variable AV block, and degenerative joint disease; who presents after being found down at home by a neighbor.  At baseline patient lives at home alone and family comes on to check on her from time to time.  Patient was last noted to be in her normal state of health on Sunday.  Today the maintenance man had come over to her house and found her laying on the floor in the kitchen on her right side.  History is limited due to the patient acute condition.  Patient had just been hospitalized last month with  Dr. Doylene Canard for permanent atrial fibrillation.  She appears to have been continued on aspirin alone at that time with medication adjustments for rate control.  ED Course: Upon admission into the emergency department patient was seen to be afebrile, pulse up to 130, respirations 18-24, blood pressure elevated up to 200/131, and O2 saturations maintained.  CT scan of the brain revealed a hypoattenuation of the left MCA concerning for acute/subacute thrombus.  Labs revealed WBC 12.3, BUN 20, creatinine 1.39,AST 46, Total bilirubin 1.4, and CK 627.  Neurology was consulted and recommended MRI and MRA of the brain.  Case was discussed with Dr. Elita Quick he recommended hospitalist admit.  Patient has been started on Cardizem drip for rate control and given 500 mL of normal saline IV fluids.  Review of Systems  Unable to perform ROS: Acuity of condition  Neurological: Positive for speech change and focal weakness.    Past Medical History:  Diagnosis Date  . Arthritis   . H/O diastolic dysfunction    . Heart palpitations   . Hypertension   . RBBB (right bundle branch block) 11/20/2010   Echo - EF >55%; flow pattern suggestive of impaired LV relaxation, proximal septal thickening noted; mitral valve mildly thickened; mild mitral annular calcification; aortic root sclerosis/calcification    History reviewed. No pertinent surgical history.   reports that she quit smoking about 36 years ago. She has never used smokeless tobacco. She reports that she does not drink alcohol or use drugs.  No Known Allergies  No significant family history reported  Prior to Admission medications   Medication Sig Start Date End Date Taking? Authorizing Provider  acetaminophen (TYLENOL) 325 MG tablet Take 2 tablets (650 mg total) by mouth every 6 (six) hours as needed for mild pain (or Fever >/= 101). 12/23/18   Hosie Poisson, MD  amiodarone (PACERONE) 200 MG tablet Take 1 tablet (200 mg total) by mouth daily. 05/21/19   Dixie Dials, MD  aspirin 81 MG chewable tablet Chew 81 mg by mouth daily.    [provider]  bisacodyl (DULCOLAX) 10 MG suppository Place 1 suppository (10 mg total) rectally daily as needed for moderate constipation. 05/21/19   Dixie Dials, MD  diclofenac sodium (VOLTAREN) 1 % GEL Apply 2 g topically 4 (four) times daily. 05/21/19   Dixie Dials, MD  diltiazem (CARDIZEM) 30 MG tablet Take 1 tablet (30 mg total) by mouth every morning. 05/21/19   Dixie Dials, MD  furosemide (LASIX) 20 MG tablet Take 1 tablet (20 mg total) by  mouth every Monday, Wednesday, and Friday. 05/22/19   Dixie Dials, MD  hydrALAZINE (APRESOLINE) 25 MG tablet Take 1 tablet (25 mg total) by mouth 2 (two) times a day. 05/21/19   Dixie Dials, MD  metoprolol tartrate (LOPRESSOR) 25 MG tablet Take 1 tablet (25 mg total) by mouth 2 (two) times daily. 06/26/17   Dixie Dials, MD  potassium chloride (K-DUR) 10 MEQ tablet Take 1 tablet (10 mEq total) by mouth every Monday, Wednesday, and Friday. 05/22/19   Dixie Dials, MD    Physical Exam:  Constitutional: Elderly female currently unable to follow commands Vitals:   07/01/19 1534 07/01/19 1545 07/01/19 1600 07/01/19 1615  BP: (!) 149/111 (!) 171/136 (!) 176/102 (!) 177/93  Pulse:  97 (!) 130 (!) 105  Resp: (!) 24 (!) 22 (!) 22 (!) 22  Temp:      TempSrc:      SpO2:  98% 98% 98%   Eyes: PERRL, conjunctival injection with periorbital swelling noted of the right ENMT: Mucous membranes are dry.  Posterior pharynx clear of any exudate or lesions.  Neck: normal, supple, no masses, no thyromegaly Respiratory: clear to auscultation bilaterally, no wheezing, no crackles. Normal respiratory effort. No accessory muscle use.  Cardiovascular: Irregular irregular, no murmurs / rubs / gallops. No extremity edema. 2+ pedal pulses. No carotid bruits.  Abdomen: no tenderness, no masses palpated. No hepatosplenomegaly. Bowel sounds positive.  Musculoskeletal: no clubbing / cyanosis. No joint deformity upper and lower extremities. Good ROM, no contractures. Normal muscle tone.  Skin: Bruising to the right forehead and right shoulder. Neurologic: CN 2-12 grossly intact.  Strength 5/5 on the left side and flaccid on the right.  Psychiatric: Unable to assess for orientation at this time.    Labs on Admission: I have personally reviewed following labs and imaging studies  CBC: Recent Labs  Lab 07/01/19 1435  WBC 12.3*  NEUTROABS 10.9*  HGB 14.3  HCT 46.2*  MCV 83.4  PLT 568   Basic Metabolic Panel: Recent Labs  Lab 07/01/19 1435  NA 138  K 3.5  CL 103  CO2 21*  GLUCOSE 115*  BUN 20  CREATININE 1.39*  CALCIUM 9.5   GFR: CrCl cannot be calculated (Unknown ideal weight.). Liver Function Tests: Recent Labs  Lab 07/01/19 1435  AST 46*  ALT 15  ALKPHOS 206*  BILITOT 1.4*  PROT 7.4  ALBUMIN 3.3*   No results for input(s): LIPASE, AMYLASE in the last 168 hours. No results for input(s): AMMONIA in the last 168 hours. Coagulation Profile:  Recent Labs  Lab 07/01/19 1435  INR 1.1   Cardiac Enzymes: Recent Labs  Lab 07/01/19 1435  CKTOTAL 627*   BNP (last 3 results) No results for input(s): PROBNP in the last 8760 hours. HbA1C: No results for input(s): HGBA1C in the last 72 hours. CBG: No results for input(s): GLUCAP in the last 168 hours. Lipid Profile: No results for input(s): CHOL, HDL, LDLCALC, TRIG, CHOLHDL, LDLDIRECT in the last 72 hours. Thyroid Function Tests: No results for input(s): TSH, T4TOTAL, FREET4, T3FREE, THYROIDAB in the last 72 hours. Anemia Panel: No results for input(s): VITAMINB12, FOLATE, FERRITIN, TIBC, IRON, RETICCTPCT in the last 72 hours. Urine analysis:    Component Value Date/Time   COLORURINE YELLOW 05/17/2019 1852   APPEARANCEUR HAZY (A) 05/17/2019 1852   LABSPEC 1.014 05/17/2019 1852   PHURINE 6.0 05/17/2019 1852   GLUCOSEU NEGATIVE 05/17/2019 1852   HGBUR SMALL (A) 05/17/2019 1852   BILIRUBINUR NEGATIVE 05/17/2019  [place] 05/17/2019 1852   PROTEINUR 100 (A) 05/17/2019 1852   UROBILINOGEN 0.2 01/24/2015 0736   NITRITE NEGATIVE 05/17/2019 1852   LEUKOCYTESUR LARGE (A) 05/17/2019 1852   Sepsis Labs: No results found for this or any previous visit (from the past 240 hour(s)).   Radiological Exams on Admission: Dg Chest 1 View  Result Date: 07/01/2019 CLINICAL DATA:  Suspect fall. EXAM: CHEST  1 VIEW COMPARISON:  Chest x-ray dated May 17, 2019. FINDINGS: Unchanged moderate cardiomegaly. Normal pulmonary vascularity. No focal consolidation, pleural effusion, or pneumothorax. IMPRESSION: No active disease. Electronically Signed   By: Titus Dubin M.D.   On: 07/01/2019 13:39   Ct Head Wo Contrast  Result Date: 07/01/2019 CLINICAL DATA:  Altered level of consciousness, found down, last seen normal 2 days prior EXAM: CT HEAD WITHOUT CONTRAST CT CERVICAL SPINE WITHOUT CONTRAST TECHNIQUE: Multidetector CT imaging of the head and cervical spine was performed  following the standard protocol without intravenous contrast. Multiplanar CT image reconstructions of the cervical spine were also generated. COMPARISON:  CT head 05/17/2019 FINDINGS: CT HEAD FINDINGS Brain: Geographic region of hypoattenuation within the left frontal lobe, caudate, internal capsule and lentiform nucleus compatible with a left MCA distribution infarct. No evidence of hemorrhage, hydrocephalus, extra-axial collection or mass lesion/mass effect. Patchy areas of white matter hypoattenuation are most compatible with chronic microvascular angiopathy. Symmetric prominence of the ventricles, cisterns and sulci compatible with parenchymal volume loss. Vascular: Hyperattenuation of the left MCA concerning for acute/subacute thrombus. Atherosclerotic calcification of the carotid siphons. Skull: No calvarial fracture or suspicious osseous lesion. No scalp swelling or hematoma. Sinuses/Orbits: Paranasal sinuses and mastoid air cells are predominantly clear. Orbital structures are unremarkable aside from prior lens extractions. Other: Edentulous with mandibular prognathism CT CERVICAL SPINE FINDINGS Alignment: Cervical stabilization collar in place. Slight reversal of the normal cervical lordosis centered at C5. Craniocervical and atlantoaxial alignment is maintained. Skull base and vertebrae: No acute fracture. No primary bone lesion or focal pathologic process. Extensive subcortical cystic changes noted in the dens. Soft tissues and spinal canal: No pre or paravertebral fluid or swelling. No visible canal hematoma. Disc levels: Multilevel intervertebral disc height loss with spondylitic endplate changes. Posterior disc osteophyte complexes are noted throughout the cervical spine resulting in at most mild canal stenosis. Uncinate spurring and facet hypertrophic changes result in multilevel neural foraminal narrowing, severe on the left at C2-3, C3-4 and C4-5 and mild severe on the right at C4-5 and C7-T1.  Additional mild-to-moderate foraminal stenoses are seen diffusely Upper chest: Biapical pleuroparenchymal scarring. Upper lungs are otherwise clear. Extensive vascular calcification in the cervical carotids. Other: None. IMPRESSION: 1. Hyperattenuation of the left MCA concerning for acute/subacute thrombus. 2. Geographic region of hypoattenuation within the left frontal lobe, caudate, internal capsule, and lentiform nucleus compatible with a left MCA distribution infarct. 3. No scalp swelling or calvarial fracture. 4. No acute cervical spine fracture. 5. Moderate to severe multilevel degenerative changes throughout the cervical spine, detailed above. These results were called by telephone at the time of interpretation on 07/01/2019 at 3:47 pm to Dr. Roslynn Amble , who verbally acknowledged these results. Electronically Signed   By: Lovena Le M.D.   On: 07/01/2019 15:53   Ct Cervical Spine Wo Contrast  Result Date: 07/01/2019 CLINICAL DATA:  Altered level of consciousness, found down, last seen normal 2 days prior EXAM: CT HEAD WITHOUT CONTRAST CT CERVICAL SPINE WITHOUT CONTRAST TECHNIQUE: Multidetector CT imaging of the head and cervical spine was  performed following the standard protocol without intravenous contrast. Multiplanar CT image reconstructions of the cervical spine were also generated. COMPARISON:  CT head 05/17/2019 FINDINGS: CT HEAD FINDINGS Brain: Geographic region of hypoattenuation within the left frontal lobe, caudate, internal capsule and lentiform nucleus compatible with a left MCA distribution infarct. No evidence of hemorrhage, hydrocephalus, extra-axial collection or mass lesion/mass effect. Patchy areas of white matter hypoattenuation are most compatible with chronic microvascular angiopathy. Symmetric prominence of the ventricles, cisterns and sulci compatible with parenchymal volume loss. Vascular: Hyperattenuation of the left MCA concerning for acute/subacute thrombus. Atherosclerotic  calcification of the carotid siphons. Skull: No calvarial fracture or suspicious osseous lesion. No scalp swelling or hematoma. Sinuses/Orbits: Paranasal sinuses and mastoid air cells are predominantly clear. Orbital structures are unremarkable aside from prior lens extractions. Other: Edentulous with mandibular prognathism CT CERVICAL SPINE FINDINGS Alignment: Cervical stabilization collar in place. Slight reversal of the normal cervical lordosis centered at C5. Craniocervical and atlantoaxial alignment is maintained. Skull base and vertebrae: No acute fracture. No primary bone lesion or focal pathologic process. Extensive subcortical cystic changes noted in the dens. Soft tissues and spinal canal: No pre or paravertebral fluid or swelling. No visible canal hematoma. Disc levels: Multilevel intervertebral disc height loss with spondylitic endplate changes. Posterior disc osteophyte complexes are noted throughout the cervical spine resulting in at most mild canal stenosis. Uncinate spurring and facet hypertrophic changes result in multilevel neural foraminal narrowing, severe on the left at C2-3, C3-4 and C4-5 and mild severe on the right at C4-5 and C7-T1. Additional mild-to-moderate foraminal stenoses are seen diffusely Upper chest: Biapical pleuroparenchymal scarring. Upper lungs are otherwise clear. Extensive vascular calcification in the cervical carotids. Other: None. IMPRESSION: 1. Hyperattenuation of the left MCA concerning for acute/subacute thrombus. 2. Geographic region of hypoattenuation within the left frontal lobe, caudate, internal capsule, and lentiform nucleus compatible with a left MCA distribution infarct. 3. No scalp swelling or calvarial fracture. 4. No acute cervical spine fracture. 5. Moderate to severe multilevel degenerative changes throughout the cervical spine, detailed above. These results were called by telephone at the time of interpretation on 07/01/2019 at 3:47 pm to Dr. Roslynn Amble , who  verbally acknowledged these results. Electronically Signed   By: Lovena Le M.D.   On: 07/01/2019 15:53   Dg Hip Unilat W Or Wo Pelvis 2-3 Views Right  Result Date: 07/01/2019 CLINICAL DATA:  Fall. EXAM: DG HIP (WITH OR WITHOUT PELVIS) 2-3V RIGHT COMPARISON:  Right hip x-rays dated May 17, 2019. FINDINGS: No acute fracture or dislocation. Unchanged end-stage right hip osteoarthritis with superolateral subluxation of the femoral head. Osteopenia. Soft tissues are unremarkable. IMPRESSION: 1.  No acute osseous abnormality. 2. Unchanged end-stage right hip osteoarthritis. Electronically Signed   By: Titus Dubin M.D.   On: 07/01/2019 13:42    EKG: Independently reviewed.  Atrial fibrillation at 102 bpm  Assessment/Plan CVA: Acute.  Presents after being found down at home for some unknown period of time with new onset right-sided weakness.  CT scan of the head showing hypoattenuation of the left MCA concerning for acute/subacute stroke. - Admit to telemetry bed - Stroke order set initiated - Neuro checks - Check  MRI/MRA head w/o contrast and MRA neck w/ w/o contrast - PT/OT/Speech to eval and treat - Check echocardiogram - Check Hemoglobin A1c and lipid panel in a.m. - ASA - Appreciate neurology consultative services, will follow-up for further recommendation - Social work consult   Atrial fibrillation: Patient not on anticoagulation.CHA2DS2-VASc score =  6.  Patient appears to warrants being started on anticoagulation.  Dr. Terrence Dupont was notified of the patient's admission, but recommended hospitalist admit. -Continue diltiazem drip -Follow-up with Dr. Doylene Canard in a.m.  Essential hypertension -Allow for permissive hypertension 220/120 -Restart home blood pressure medications when Chronic kidney disease stage III: Stable. -Continue to monitor  Osteoarthritis -Continue Voltaren gel  DVT prophylaxis: Lovenox Code Status: Full Family Communication: Discussed plan of care with the  patient's son present at bedside Disposition Plan: To be determined Consults called: Neurology Admission status: Inpatient  Norval Morton MD [facility] Pager [phone]   If 7PM-7AM, please contact night-coverage [email or web address] Password [facility]  07/01/2019, 4:27 PM

## 2019-07-01 NOTE — ED Notes (Signed)
Patient transported to CT 

## 2019-07-01 NOTE — ED Notes (Signed)
Pt given dc instructions pt verbalizes understanding.  

## 2019-07-01 NOTE — ED Provider Notes (Signed)
85 year old lady presented to ER after being found down by a neighbor, last seen normal over 2 days ago.  In ER noted to have right-sided weakness, A. fib with RVR.  Started on rate control with diltiazem.  Anticipate admission after labs and CT.  3:30 PM I received signout from Dr. Lita Mains  3:50 PM received call from radiologist, concern for left MCA hyperdense lesion, paged neurology and Dr. Terrence Dupont  Discussed case with neurology, recommends MRI, MRA and admission for stroke work-up  I discussed case with Dr. Terrence Dupont who recommends admission to tried hospitalist group  Recheck patient, discussed findings with patient's son at bedside, noted right upper extremity is flaccid, moving left upper and left lower extremity spontaneously  Discussed case with hospitalist service who agrees to admit.  Dr. Tamala Julian admitting physician.   Lucrezia Starch, MD 07/02/19 [phone]

## 2019-07-01 NOTE — ED Notes (Signed)
Patient returned from MRI.

## 2019-07-01 NOTE — Progress Notes (Signed)
Consult request has been received. CSW attempting to follow up at present time  Elaisha Zahniser M. Joliene Salvador LCSWA Transitions of Care  Clinical Social Worker  Ph: [phone] 

## 2019-07-01 NOTE — Consult Note (Signed)
Neurology Consultation  Reason for Consult: Stroke Referring Physician: Dr Myna Hidalgo  CC: found down  History is obtained from: Chart review, Shannon Cruz over the phone  HPI: Shannon Cruz is a 85 y.o. female past medical history of hypertension, mild mitral annular calcification and aortic root sclerosis/calcification, severe arthritis requiring wheelchair and walker to ambulate at baseline, last known normal Sunday, 06/28/2019 and found down by maintenance staff who came in to do cement and ended at the house where she lives by herself today and brought into the hospital for evaluation.  Noted not to be moving the right side and to be aphasic.  Noncontrast head CT with a large hypodensity in the left MCA territory.  MRI confirms a large left MCA territory infarct. According to the son, she is not had a stroke in the past but has been having walking and memory difficulties over the past few weeks to months.  She is been requiring a walker to walk and since last admission required wheelchair off and on which she refuses to use as much as possible. No history of atrial fibrillation.  No history of previous illness sickness prior to the presentation that the son could provide me but the patient is unable to provide any further history   LKW: Sometime on 06/28/2019 tpa given?: no, outside the window Premorbid modified Rankin scale (mRS):4  ROS: Unable to obtain due to altered mental status.   Past Medical History:  Diagnosis Date  . Arthritis   . H/O diastolic dysfunction   . Heart palpitations   . Hypertension   . RBBB (right bundle branch block) 11/20/2010   Echo - EF >55%; flow pattern suggestive of impaired LV relaxation, proximal septal thickening noted; mitral valve mildly thickened; mild mitral annular calcification; aortic root sclerosis/calcification    No family history on file. Patient unable to provide  Social History:   reports that she quit smoking about 36 years ago.  She has never used smokeless tobacco. She reports that she does not drink alcohol or use drugs.  Medications  Current Facility-Administered Medications:  .   stroke: mapping our early stages of recovery book, , Does not apply, Once, Smith, Rondell A, MD .  0.9 %  sodium chloride infusion, , Intravenous, Continuous, Smith, Rondell A, MD, Last Rate: 75 mL/hr at 07/01/19 1943 .  acetaminophen (TYLENOL) tablet 650 mg, 650 mg, Oral, Q4H PRN **OR** acetaminophen (TYLENOL) solution 650 mg, 650 mg, Per Tube, Q4H PRN **OR** acetaminophen (TYLENOL) suppository 650 mg, 650 mg, Rectal, Q4H PRN, Smith, Rondell A, MD .  aspirin suppository 300 mg, 300 mg, Rectal, Daily, Tamala Julian, Rondell A, MD, 300 mg at 07/01/19 1949 .  diclofenac sodium (VOLTAREN) 1 % transdermal gel 2 g, 2 g, Topical, QID, Smith, Rondell A, MD .  diltiazem (CARDIZEM) 100 mg in dextrose 5% 183mL (1 mg/mL) infusion, 5-15 mg/hr, Intravenous, Continuous, Smith, Rondell A, MD, Last Rate: 12.5 mL/hr at 07/01/19 1620, 12.5 mg/hr at 07/01/19 1620 .  enoxaparin (LOVENOX) injection 40 mg, 40 mg, Subcutaneous, Q24H, Smith, Rondell A, MD, 40 mg at 07/01/19 2033 .  senna-docusate (Senokot-S) tablet 1 tablet, 1 tablet, Oral, QHS PRN, Norval Morton, MD  Current Outpatient Medications:  .  acetaminophen (TYLENOL) 325 MG tablet, Take 2 tablets (650 mg total) by mouth every 6 (six) hours as needed for mild pain (or Fever >/= 101)., Disp: , Rfl:  .  amiodarone (PACERONE) 200 MG tablet, Take 1 tablet (200 mg total) by mouth daily., Disp: 30  tablet, Rfl: 1 .  aspirin 81 MG chewable tablet, Chew 81 mg by mouth daily., Disp: , Rfl:  .  bisacodyl (DULCOLAX) 10 MG suppository, Place 1 suppository (10 mg total) rectally daily as needed for moderate constipation., Disp: 12 suppository, Rfl: 0 .  diclofenac sodium (VOLTAREN) 1 % GEL, Apply 2 g topically 4 (four) times daily., Disp: 200 g, Rfl: 1 .  diltiazem (CARDIZEM) 30 MG tablet, Take 1 tablet (30 mg total) by  mouth every morning., Disp: , Rfl:  .  furosemide (LASIX) 20 MG tablet, Take 1 tablet (20 mg total) by mouth every Monday, Wednesday, and Friday., Disp: 15 tablet, Rfl: 1 .  hydrALAZINE (APRESOLINE) 25 MG tablet, Take 1 tablet (25 mg total) by mouth 2 (two) times a day., Disp: 60 tablet, Rfl: 3 .  metoprolol tartrate (LOPRESSOR) 25 MG tablet, Take 1 tablet (25 mg total) by mouth 2 (two) times daily., Disp: 60 tablet, Rfl: 3 .  potassium chloride (K-DUR) 10 MEQ tablet, Take 1 tablet (10 mEq total) by mouth every Monday, Wednesday, and Friday., Disp: 30 tablet, Rfl: 3   Exam: Current vital signs: BP (!) 169/84   Pulse 94   Temp 99.3 F (37.4 C) (Rectal)   Resp 17   SpO2 98%  Vital signs in last 24 hours: Temp:  [99.3 F (37.4 C)] 99.3 F (37.4 C) (08/26 1100) Pulse Rate:  [76-130] 94 (08/26 2030) Resp:  [0-24] 17 (08/26 2030) BP: (149-200)/(83-151) 169/84 (08/26 2030) SpO2:  [95 %-100 %] 98 % (08/26 2030) General: Sleepy, keeps her eyes closed forcibly upon trying to open. HEENT: Normocephalic atraumatic Lungs: Clear to auscultation Cardiovascular: S1-S2 heard regular rate rhythm Abdomen: Soft nondistended nontender Neurological exam Sleepy, keeps her eyes forcibly closed upon attempting to open. Does not follow any commands Nonverbal Cranial nerves: Pupils are equal round reactive light, difficult exam and eye movements due to noncooperation, subtle right facial asymmetry compared to the left. Motor exam: Flaccid right upper extremity, moves right lower extremity some to noxious stimulation, left upper extremity is antigravity in nearly full strength 5/5.  Left lower extremity also is stronger than the right but barely antigravity. Sensory exam: Grimaces to noxious stim in all fours Coordination and gait cannot be tested NIH stroke scale 1a Level of Conscious.: 1 1b LOC Questions: 2 1c LOC Commands: 2 2 Best Gaze: 0 3 Visual: 0 4 Facial Palsy: 1 5a Motor Arm - left: 0 5b  Motor Arm - Right: 4 6a Motor Leg - Left: 2 6b Motor Leg - Right: 2 7 Limb Ataxia: 0 8 Sensory: 0 9 Best Language: 3 10 Dysarthria: 2 11 Extinct. and Inatten.:0  TOTAL: 19  Labs I have reviewed labs in epic and the results pertinent to this consultation are:  CBC    Component Value Date/Time   WBC 12.3 (H) 07/01/2019 1435   RBC 5.54 (H) 07/01/2019 1435   HGB 14.3 07/01/2019 1435   HCT 46.2 (H) 07/01/2019 1435   PLT 368 07/01/2019 1435   MCV 83.4 07/01/2019 1435   MCH 25.8 (L) 07/01/2019 1435   MCHC 31.0 07/01/2019 1435   RDW 18.0 (H) 07/01/2019 1435   LYMPHSABS 0.5 (L) 07/01/2019 1435   MONOABS 0.8 07/01/2019 1435   EOSABS 0.0 07/01/2019 1435   BASOSABS 0.0 07/01/2019 1435    CMP     Component Value Date/Time   NA 138 07/01/2019 1435   K 3.5 07/01/2019 1435   CL 103 07/01/2019 1435   CO2 21 (  L) 07/01/2019 1435   GLUCOSE 115 (H) 07/01/2019 1435   BUN 20 07/01/2019 1435   CREATININE 1.39 (H) 07/01/2019 1435   CALCIUM 9.5 07/01/2019 1435   PROT 7.4 07/01/2019 1435   ALBUMIN 3.3 (L) 07/01/2019 1435   AST 46 (H) 07/01/2019 1435   ALT 15 07/01/2019 1435   ALKPHOS 206 (H) 07/01/2019 1435   BILITOT 1.4 (H) 07/01/2019 1435   GFRNONAA 33 (L) 07/01/2019 1435   GFRAA 38 (L) 07/01/2019 1435   Imaging I have reviewed the images obtained: CT head with a left MCA territory hypodensity.  MRA head with a left M1 occlusion.  MRI brain with an established infarct in the left MCA territory.  Chronic vertebral occlusion MRA of the head and neck.  CTH   MRA head   MRI brain    Assessment: 85 year old woman with past medical history as above presenting with complaints of being found down and on examination noted to be aphasic and weak on the right side.  MRI reveals a large left MCA territory infarct due to left M1 occlusion. Cardio moderate versus atheroembolic. Outside the window for TPA or intervention at this time.  Impression: Acute ischemic stroke   Recommendations: Admit to medicine Telemetry Frequent neurochecks Aspirin 325, atorvastatin 80 2D echocardiogram A1c, lipid panel PT OT ST N.p.o. until cleared by formal swallow evaluation or bedside swallow evaluation Spoke with the son over the phone and explained the treatment plan.  Answered all his questions. Stroke team to continue to follow with you.  Cover test negative.  -- Amie Portland, MD [facility] Pager: [phone] If 7pm to 7am, please call on call as listed on [facility].

## 2019-07-01 NOTE — ED Notes (Signed)
Patient transported to X-ray 

## 2019-07-02 ENCOUNTER — Inpatient Hospital Stay (HOSPITAL_COMMUNITY): Payer: Medicare Other

## 2019-07-02 DIAGNOSIS — G936 Cerebral edema: Secondary | ICD-10-CM

## 2019-07-02 DIAGNOSIS — I63512 Cerebral infarction due to unspecified occlusion or stenosis of left middle cerebral artery: Principal | ICD-10-CM

## 2019-07-02 LAB — CBC WITH DIFFERENTIAL/PLATELET
Abs Immature Granulocytes: 0.07 10*3/uL (ref 0.00–0.07)
Basophils Absolute: 0.1 10*3/uL (ref 0.0–0.1)
Basophils Relative: 0 %
Eosinophils Absolute: 0 10*3/uL (ref 0.0–0.5)
Eosinophils Relative: 0 %
HCT: 43.6 % (ref 36.0–46.0)
Hemoglobin: 13.8 g/dL (ref 12.0–15.0)
Immature Granulocytes: 1 %
Lymphocytes Relative: 5 %
Lymphs Abs: 0.6 10*3/uL — ABNORMAL LOW (ref 0.7–4.0)
MCH: 25.9 pg — ABNORMAL LOW (ref 26.0–34.0)
MCHC: 31.7 g/dL (ref 30.0–36.0)
MCV: 81.8 fL (ref 80.0–100.0)
Monocytes Absolute: 1.4 10*3/uL — ABNORMAL HIGH (ref 0.1–1.0)
Monocytes Relative: 10 %
Neutro Abs: 11.2 10*3/uL — ABNORMAL HIGH (ref 1.7–7.7)
Neutrophils Relative %: 84 %
Platelets: 378 10*3/uL (ref 150–400)
RBC: 5.33 MIL/uL — ABNORMAL HIGH (ref 3.87–5.11)
RDW: 17.9 % — ABNORMAL HIGH (ref 11.5–15.5)
WBC: 13.4 10*3/uL — ABNORMAL HIGH (ref 4.0–10.5)
nRBC: 0 % (ref 0.0–0.2)

## 2019-07-02 LAB — LIPID PANEL
Cholesterol: 187 mg/dL (ref 0–200)
HDL: 50 mg/dL (ref >40)
LDL Cholesterol: 122 mg/dL — ABNORMAL HIGH (ref 0–99)
Total CHOL/HDL Ratio: 3.7 RATIO
Triglycerides: 73 mg/dL (ref <150)
VLDL: 15 mg/dL (ref 0–40)

## 2019-07-02 LAB — COMPREHENSIVE METABOLIC PANEL
ALT: 14 U/L (ref 0–44)
AST: 38 U/L (ref 15–41)
Albumin: 3 g/dL — ABNORMAL LOW (ref 3.5–5.0)
Alkaline Phosphatase: 172 U/L — ABNORMAL HIGH (ref 38–126)
Anion gap: 12 (ref 5–15)
BUN: 22 mg/dL (ref 8–23)
CO2: 22 mmol/L (ref 22–32)
Calcium: 9.4 mg/dL (ref 8.9–10.3)
Chloride: 105 mmol/L (ref 98–111)
Creatinine, Ser: 1.24 mg/dL — ABNORMAL HIGH (ref 0.44–1.00)
GFR calc Af Amer: 44 mL/min — ABNORMAL LOW (ref >60)
GFR calc non Af Amer: 38 mL/min — ABNORMAL LOW (ref >60)
Glucose, Bld: 111 mg/dL — ABNORMAL HIGH (ref 70–99)
Potassium: 3.9 mmol/L (ref 3.5–5.1)
Sodium: 139 mmol/L (ref 135–145)
Total Bilirubin: 1.5 mg/dL — ABNORMAL HIGH (ref 0.3–1.2)
Total Protein: 7.2 g/dL (ref 6.5–8.1)

## 2019-07-02 LAB — HEMOGLOBIN A1C
Hgb A1c MFr Bld: 6.3 % — ABNORMAL HIGH (ref 4.8–5.6)
Mean Plasma Glucose: 134.11 mg/dL

## 2019-07-02 LAB — ECHOCARDIOGRAM COMPLETE

## 2019-07-02 LAB — CBG MONITORING, ED: Glucose-Capillary: 97 mg/dL (ref 70–99)

## 2019-07-02 LAB — GLUCOSE, CAPILLARY: Glucose-Capillary: 104 mg/dL — ABNORMAL HIGH (ref 70–99)

## 2019-07-02 MED ORDER — DICLOFENAC SODIUM 1 % TD GEL: 2.0000 g | Freq: Four times a day (QID) | TRANSDERMAL | Status: DC | PRN

## 2019-07-02 MED ORDER — ENOXAPARIN SODIUM 30 MG/0.3ML ~~LOC~~ SOLN
30.0000 mg | SUBCUTANEOUS | Status: DC
  Administered 2019-07-02 – 2019-07-03 (×2): 30 mg via SUBCUTANEOUS
  Filled 2019-07-02 (×2): qty 0.3

## 2019-07-02 NOTE — Progress Notes (Signed)
Obtained report from ED RN Claiborne Billings, patient to be transferred to 3w-21

## 2019-07-02 NOTE — Evaluation (Signed)
Physical Therapy Evaluation Patient Details Name: Shannon Cruz MRN: [id number] DOB: 26-Feb-1925 Today's Date: 07/02/2019   History of Present Illness  Pt is a 85 y/o female admitted after being found on the floor at home. MRI revealed L MCA infarct. PMH includes HTN and a fib.   Clinical Impression  Pt admitted secondary to problem above with deficits below. Pt with R extremity flaccidity and was unable to follow commands throughout session. Did have reflex to painful stimuli on the LLE, however, not on the RLE. Pt requiring total A to roll from side to side. Did squeeze PT's hand using L hand, however, feel this was likely more reflexive. Per notes, awaiting palliative care consult for goals of care discussion. Will continue to follow acutely and update POC accordingly.     Follow Up Recommendations SNF;Supervision/Assistance - 24 hour;Other (comment)(vs palliative recs)    Equipment Recommendations  None recommended by PT    Recommendations for Other Services       Precautions / Restrictions Precautions Precautions: Fall Restrictions Weight Bearing Restrictions: No      Mobility  Bed Mobility Overal bed mobility: Needs Assistance Bed Mobility: Rolling Rolling: Total assist         General bed mobility comments: Total A to roll from side to side. Pt unable to assist and unable to follow commands to assist.   Transfers                    Ambulation/Gait                Stairs            Wheelchair Mobility    Modified Rankin (Stroke Patients Only) Modified Rankin (Stroke Patients Only) Pre-Morbid Rankin Score: No significant disability Modified Rankin: Severe disability     Balance                                             Pertinent Vitals/Pain Pain Assessment: Faces Faces Pain Scale: Hurts little more Pain Location: generalized Pain Descriptors / Indicators: Grimacing Pain Intervention(s): Limited activity  within patient's tolerance;Monitored during session;Repositioned    Home Living Family/patient expects to be discharged to:: Unsure                 Additional Comments: Pt lethargic throughout session.     Prior Function           Comments: Unsure as pt not responding to questions.      Hand Dominance        Extremity/Trunk Assessment   Upper Extremity Assessment Upper Extremity Assessment: Defer to OT evaluation;LUE deficits/detail;RUE deficits/detail RUE Deficits / Details: Flaccid throughout. Able to move through full ROM.  LUE Deficits / Details: Pt resistive to LUE movement. Would squeeze PT hand using L hand, however, feel more reflexive vs. purposeful.     Lower Extremity Assessment Lower Extremity Assessment: RLE deficits/detail;LLE deficits/detail RLE Deficits / Details: Flaccid throughout RLE. Able to move RLE through full ROM.  LLE Deficits / Details: Pt unable to follow commands to move LLE. Was able to move through full ROM.        Communication   Communication: Expressive difficulties;Other (comment)(mumbling occaisionally )  Cognition Arousal/Alertness: Lethargic Behavior During Therapy: Flat affect Overall Cognitive Status: Difficult to assess  General Comments: Pt lethargic throughout session. Did not open eyes. Would occasionally mumble. Pt not following verbal commands. Pt squeezing PT hand using L hand, however, feel this was more reflexive vs. purposeful.       General Comments      Exercises     Assessment/Plan    PT Assessment Patient needs continued PT services  PT Problem List Decreased strength;Decreased activity tolerance;Decreased balance;Decreased range of motion;Decreased cognition;Decreased knowledge of use of DME;Decreased safety awareness;Decreased knowledge of precautions;Decreased coordination;Decreased mobility       PT Treatment Interventions DME instruction;Functional  mobility training;Therapeutic activities;Therapeutic exercise;Balance training;Neuromuscular re-education;Patient/family education    PT Goals (Current goals can be found in the Care Plan section)  Acute Rehab PT Goals PT Goal Formulation: Patient unable to participate in goal setting Time For Goal Achievement: 07/16/19 Potential to Achieve Goals: Fair    Frequency Min 3X/week   Barriers to discharge Other (comment) unsure of caregiver support     Co-evaluation               AM-PAC PT "6 Clicks" Mobility  Outcome Measure Help needed turning from your back to your side while in a flat bed without using bedrails?: Total Help needed moving from lying on your back to sitting on the side of a flat bed without using bedrails?: Total Help needed moving to and from a bed to a chair (including a wheelchair)?: Total Help needed standing up from a chair using your arms (e.g., wheelchair or bedside chair)?: Total Help needed to walk in hospital room?: Total Help needed climbing 3-5 steps with a railing? : Total 6 Click Score: 6    End of Session   Activity Tolerance: Patient limited by lethargy Patient left: in bed;with call bell/phone within reach;with bed alarm set Nurse Communication: Mobility status PT Visit Diagnosis: Muscle weakness (generalized) (M62.81);Unsteadiness on feet (R26.81);Difficulty in walking, not elsewhere classified (R26.2);Hemiplegia and hemiparesis Hemiplegia - Right/Left: Right Hemiplegia - caused by: Cerebral infarction    Time: 7001-7494 PT Time Calculation (min) (ACUTE ONLY): 15 min   Charges:   PT Evaluation $PT Eval Moderate Complexity: 1 Mod          Leighton Ruff, PT, DPT  Acute Rehabilitation Services  Pager: [phone] Office: [phone]   Rudean Hitt 07/02/2019, 6:18 PM

## 2019-07-02 NOTE — ED Notes (Signed)
Daughter at bedside.

## 2019-07-02 NOTE — Progress Notes (Signed)
PROGRESS NOTE    Shannon Cruz  QVZ:[id number] DOB: 03-Nov-1925 DOA: 07/01/2019 PCP: Dixie Dials, MD   Brief Narrative: 85 year old with past medical history significant for hypertension, a flutter with variable AV block who presents after being found down at home by a neighbor.  At baseline patient lives at home alone and family comes in to check on her from time to time.  Patient was last noted to be in normal state of health on Sunday.  History is limited due to patient altered mental status and acute condition.  Patient was just recently hospitalized last month by Dr. Doylene Canard for permanent A. fib.  She was on aspirin and medication for rate control. Evaluation in the ED patient was found to have a large left MCA acute stroke.     Assessment & Plan:   Principal Problem:   CVA (cerebral vascular accident) ([place]) Active Problems:   HTN (hypertension)   Atrial fibrillation with RVR ([place])   Osteoarthritis   1-Acute large left MCA stroke MRI positive for acute left MCA stroke. Neurology consulted and following. Echo pending. On aspirin per rectum. She will need a speech PT evaluation. Patient is lethargic on my evaluation.  Discussed CODE STATUS with patient's son who is POA.  He agreed with making his mother DNR.  Palliative care consult for further goals of care.   2-A. Fib On Cardizem drip Was not on anticoagulation prior to admission.  Was only on aspirin.  Hypertension; Permissive hypertension  Chronic kidney disease a stage III IV fluids due to n.p.o. status    Estimated body mass index is 19.06 kg/m as calculated from the following:   Height as of 05/17/19: 5\' 7"  (1.702 m).   Weight as of 05/17/19: 55.2 kg.   DVT prophylaxis: Lovenox Code Status: DNR, discussed with patient's son Family Communication: Son over the phone, daughter at bedside Disposition Plan: Remain in the hospital for further treatment of acute stroke Consultants:    Neurology  Palliative  care consult  Procedures:   ECHO  Antimicrobials:    Subjective: Lethargic, open eyes and go back to sleep Rigidity of the left arm  Objective: Vitals:   07/02/19 0530 07/02/19 0630 07/02/19 0700 07/02/19 0800  BP: 140/72 (!) 160/87 (!) 147/70 (!) 161/75  Pulse: 69 77 78 64  Resp: (!) 5 (!) 0 (!) 0 (!) 0  Temp:      TempSrc:      SpO2: 99% 99% 99% 99%    Intake/Output Summary (Last 24 hours) at 07/02/2019 1126 Last data filed at 07/01/2019 1417 Gross per 24 hour  Intake 500 ml  Output --  Net 500 ml   There were no vitals filed for this visit.  Examination:  General exam: Lethargic Respiratory system: Clear to auscultation. Respiratory effort normal. Cardiovascular system: S1 & S2 heard, RRR. No JVD, murmurs, rubs, gallops or clicks. No pedal edema. Gastrointestinal system: Abdomen is nondistended, soft and nontender. No organomegaly or masses felt. Normal bowel sounds heard. Central nervous system: Lethargic, does not follow commands. left arm  rigidity resists movement Extremities: Symmetric 5 x 5 power. Skin: No rashes, lesions or ulcers    Data Reviewed: I have personally reviewed following labs and imaging studies  CBC: Recent Labs  Lab 07/01/19 1435 07/02/19 0425  WBC 12.3* 13.4*  NEUTROABS 10.9* 11.2*  HGB 14.3 13.8  HCT 46.2* 43.6  MCV 83.4 81.8  PLT 368 329   Basic Metabolic Panel: Recent Labs  Lab 07/01/19 1435 07/02/19 0425  NA 138 139  K 3.5 3.9  CL 103 105  CO2 21* 22  GLUCOSE 115* 111*  BUN 20 22  CREATININE 1.39* 1.24*  CALCIUM 9.5 9.4   GFR: CrCl cannot be calculated (Unknown ideal weight.). Liver Function Tests: Recent Labs  Lab 07/01/19 1435 07/02/19 0425  AST 46* 38  ALT 15 14  ALKPHOS 206* 172*  BILITOT 1.4* 1.5*  PROT 7.4 7.2  ALBUMIN 3.3* 3.0*   No results for input(s): LIPASE, AMYLASE in the last 168 hours. No results for input(s): AMMONIA in the last 168 hours. Coagulation  Profile: Recent Labs  Lab 07/01/19 1435  INR 1.1   Cardiac Enzymes: Recent Labs  Lab 07/01/19 1435  CKTOTAL 627*   BNP (last 3 results) No results for input(s): PROBNP in the last 8760 hours. HbA1C: Recent Labs    07/02/19 0425  HGBA1C 6.3*   CBG: Recent Labs  Lab 07/02/19 1043  GLUCAP 97   Lipid Profile: Recent Labs    07/02/19 0425  CHOL 187  HDL 50  LDLCALC 122*  TRIG 73  CHOLHDL 3.7   Thyroid Function Tests: No results for input(s): TSH, T4TOTAL, FREET4, T3FREE, THYROIDAB in the last 72 hours. Anemia Panel: No results for input(s): VITAMINB12, FOLATE, FERRITIN, TIBC, IRON, RETICCTPCT in the last 72 hours. Sepsis Labs: No results for input(s): PROCALCITON, LATICACIDVEN in the last 168 hours.  Recent Results (from the past 240 hour(s))  SARS CORONAVIRUS 2 (TAT 6-12 HRS) Nasal Swab Aptima Multi Swab     Status: None   Collection Time: 07/01/19  4:13 PM   Specimen: Aptima Multi Swab; Nasal Swab  Result Value Ref Range Status   SARS Coronavirus 2 NEGATIVE NEGATIVE Final    Comment: (NOTE) SARS-CoV-2 target nucleic acids are NOT DETECTED. The SARS-CoV-2 RNA is generally detectable in upper and lower respiratory specimens during the acute phase of infection. Negative results do not preclude SARS-CoV-2 infection, do not rule out co-infections with other pathogens, and should not be used as the sole basis for treatment or other patient management decisions. Negative results must be combined with clinical observations, patient history, and epidemiological information. The expected result is Negative. Fact Sheet for Patients: SugarRoll.be Fact Sheet for Healthcare Providers: https://www.woods-mathews.com/ This test is not yet approved or cleared by the [place] FDA and  has been authorized for detection and/or diagnosis of SARS-CoV-2 by FDA under an Emergency Use Authorization (EUA). This EUA will remain  in  effect (meaning this test can be used) for the duration of the COVID-19 declaration under Section 56 4(b)(1) of the Act, 21 U.S.C. section 360bbb-3(b)(1), unless the authorization is terminated or revoked sooner. Performed at [facility], [place] [address], [place], [place] [address]          Radiology Studies: Dg Chest 1 View  Result Date: 07/01/2019 CLINICAL DATA:  Suspect fall. EXAM: CHEST  1 VIEW COMPARISON:  Chest x-ray dated May 17, 2019. FINDINGS: Unchanged moderate cardiomegaly. Normal pulmonary vascularity. No focal consolidation, pleural effusion, or pneumothorax. IMPRESSION: No active disease. Electronically Signed   By: Titus Dubin M.D.   On: 07/01/2019 13:39   Ct Head Wo Contrast  Result Date: 07/01/2019 CLINICAL DATA:  Altered level of consciousness, found down, last seen normal 2 days prior EXAM: CT HEAD WITHOUT CONTRAST CT CERVICAL SPINE WITHOUT CONTRAST TECHNIQUE: Multidetector CT imaging of the head and cervical spine was performed following the standard protocol without intravenous contrast. Multiplanar CT image reconstructions of the cervical spine  were also generated. COMPARISON:  CT head 05/17/2019 FINDINGS: CT HEAD FINDINGS Brain: Geographic region of hypoattenuation within the left frontal lobe, caudate, internal capsule and lentiform nucleus compatible with a left MCA distribution infarct. No evidence of hemorrhage, hydrocephalus, extra-axial collection or mass lesion/mass effect. Patchy areas of white matter hypoattenuation are most compatible with chronic microvascular angiopathy. Symmetric prominence of the ventricles, cisterns and sulci compatible with parenchymal volume loss. Vascular: Hyperattenuation of the left MCA concerning for acute/subacute thrombus. Atherosclerotic calcification of the carotid siphons. Skull: No calvarial fracture or suspicious osseous lesion. No scalp swelling or hematoma. Sinuses/Orbits: Paranasal sinuses and mastoid air cells are  predominantly clear. Orbital structures are unremarkable aside from prior lens extractions. Other: Edentulous with mandibular prognathism CT CERVICAL SPINE FINDINGS Alignment: Cervical stabilization collar in place. Slight reversal of the normal cervical lordosis centered at C5. Craniocervical and atlantoaxial alignment is maintained. Skull base and vertebrae: No acute fracture. No primary bone lesion or focal pathologic process. Extensive subcortical cystic changes noted in the dens. Soft tissues and spinal canal: No pre or paravertebral fluid or swelling. No visible canal hematoma. Disc levels: Multilevel intervertebral disc height loss with spondylitic endplate changes. Posterior disc osteophyte complexes are noted throughout the cervical spine resulting in at most mild canal stenosis. Uncinate spurring and facet hypertrophic changes result in multilevel neural foraminal narrowing, severe on the left at C2-3, C3-4 and C4-5 and mild severe on the right at C4-5 and C7-T1. Additional mild-to-moderate foraminal stenoses are seen diffusely Upper chest: Biapical pleuroparenchymal scarring. Upper lungs are otherwise clear. Extensive vascular calcification in the cervical carotids. Other: None. IMPRESSION: 1. Hyperattenuation of the left MCA concerning for acute/subacute thrombus. 2. Geographic region of hypoattenuation within the left frontal lobe, caudate, internal capsule, and lentiform nucleus compatible with a left MCA distribution infarct. 3. No scalp swelling or calvarial fracture. 4. No acute cervical spine fracture. 5. Moderate to severe multilevel degenerative changes throughout the cervical spine, detailed above. These results were called by telephone at the time of interpretation on 07/01/2019 at 3:47 pm to Dr. Roslynn Amble , who verbally acknowledged these results. Electronically Signed   By: Lovena Le M.D.   On: 07/01/2019 15:53   Ct Cervical Spine Wo Contrast  Result Date: 07/01/2019 CLINICAL DATA:   Altered level of consciousness, found down, last seen normal 2 days prior EXAM: CT HEAD WITHOUT CONTRAST CT CERVICAL SPINE WITHOUT CONTRAST TECHNIQUE: Multidetector CT imaging of the head and cervical spine was performed following the standard protocol without intravenous contrast. Multiplanar CT image reconstructions of the cervical spine were also generated. COMPARISON:  CT head 05/17/2019 FINDINGS: CT HEAD FINDINGS Brain: Geographic region of hypoattenuation within the left frontal lobe, caudate, internal capsule and lentiform nucleus compatible with a left MCA distribution infarct. No evidence of hemorrhage, hydrocephalus, extra-axial collection or mass lesion/mass effect. Patchy areas of white matter hypoattenuation are most compatible with chronic microvascular angiopathy. Symmetric prominence of the ventricles, cisterns and sulci compatible with parenchymal volume loss. Vascular: Hyperattenuation of the left MCA concerning for acute/subacute thrombus. Atherosclerotic calcification of the carotid siphons. Skull: No calvarial fracture or suspicious osseous lesion. No scalp swelling or hematoma. Sinuses/Orbits: Paranasal sinuses and mastoid air cells are predominantly clear. Orbital structures are unremarkable aside from prior lens extractions. Other: Edentulous with mandibular prognathism CT CERVICAL SPINE FINDINGS Alignment: Cervical stabilization collar in place. Slight reversal of the normal cervical lordosis centered at C5. Craniocervical and atlantoaxial alignment is maintained. Skull base and vertebrae: No acute fracture. No primary bone  lesion or focal pathologic process. Extensive subcortical cystic changes noted in the dens. Soft tissues and spinal canal: No pre or paravertebral fluid or swelling. No visible canal hematoma. Disc levels: Multilevel intervertebral disc height loss with spondylitic endplate changes. Posterior disc osteophyte complexes are noted throughout the cervical spine resulting in  at most mild canal stenosis. Uncinate spurring and facet hypertrophic changes result in multilevel neural foraminal narrowing, severe on the left at C2-3, C3-4 and C4-5 and mild severe on the right at C4-5 and C7-T1. Additional mild-to-moderate foraminal stenoses are seen diffusely Upper chest: Biapical pleuroparenchymal scarring. Upper lungs are otherwise clear. Extensive vascular calcification in the cervical carotids. Other: None. IMPRESSION: 1. Hyperattenuation of the left MCA concerning for acute/subacute thrombus. 2. Geographic region of hypoattenuation within the left frontal lobe, caudate, internal capsule, and lentiform nucleus compatible with a left MCA distribution infarct. 3. No scalp swelling or calvarial fracture. 4. No acute cervical spine fracture. 5. Moderate to severe multilevel degenerative changes throughout the cervical spine, detailed above. These results were called by telephone at the time of interpretation on 07/01/2019 at 3:47 pm to Dr. Roslynn Amble , who verbally acknowledged these results. Electronically Signed   By: Lovena Le M.D.   On: 07/01/2019 15:53   Mr Angio Head Wo Contrast  Addendum Date: 07/01/2019   ADDENDUM REPORT: 07/01/2019 20:38 ADDENDUM: These results were called by telephone at the time of interpretation on 07/01/2019 at 8:15 pm to Dr. Christia Reading OPYD, who verbally acknowledged these results. Electronically Signed   By: Ulyses Jarred M.D.   On: 07/01/2019 20:38   Result Date: 07/01/2019 CLINICAL DATA:  Found down at home.  History of atrial fibrillation. EXAM: MR HEAD WITHOUT CONTRAST MR CIRCLE OF WILLIS WITHOUT CONTRAST MRA OF THE NECK WITHOUT AND WITH CONTRAST TECHNIQUE: Multiplanar, multiecho pulse sequences of the brain, circle of willis and surrounding structures were obtained without intravenous contrast. Angiographic images of the neck were obtained using MRA technique without and with intravenous contrast. CONTRAST:  5 mL Gadavist COMPARISON:  Head CT same day  FINDINGS: MRI HEAD FINDINGS BRAIN: The midline structures are normal. There is a large area of abnormal diffusion restriction within the left MCA territory. Early confluent hyperintense T2-weighted signal of the periventricular and deep white matter, most commonly due to chronic ischemic microangiopathy. Generalized atrophy without lobar predilection. Blood-sensitive sequences show no chronic microhemorrhage or superficial siderosis. SKULL AND UPPER CERVICAL SPINE: The visualized skull base, calvarium, upper cervical spine and extracranial soft tissues are normal. SINUSES/ORBITS: No fluid levels or advanced mucosal thickening. No mastoid or middle ear effusion. The orbits are normal. MRA HEAD FINDINGS POSTERIOR CIRCULATION: --Vertebral arteries: Normal V4 segments. --Posterior inferior cerebellar arteries (PICA): Patent origins from the vertebral arteries. --Anterior inferior cerebellar arteries (AICA): Normal on the right. Not clearly visualized on the left. --Basilar artery: Normal. --Superior cerebellar arteries: Normal. --Posterior cerebral arteries: Normal. Both are predominantly supplied by the posterior communicating arteries (p-comm). ANTERIOR CIRCULATION: --Intracranial internal carotid arteries: Normal. --Anterior cerebral arteries (ACA): Normal. Both A1 segments are present. Patent anterior communicating artery (a-comm). --Middle cerebral arteries (MCA): There is complete occlusion of the left middle cerebral artery at the proximal M1 segment. Right MCA is normal. MRA NECK FINDINGS Aortic arch: Normal 3 vessel aortic branching pattern. The visualized subclavian arteries are normal. Right carotid system: Narrowing at the right carotid bifurcation results in approximately 50% stenosis of the proximal internal carotid artery. Left carotid system: Normal course and caliber without stenosis or evidence of dissection. Vertebral  arteries: Right dominant. The left vertebral artery is occluded from its origin.  There is reconstitution of the V4 segment secondary to collateral flow across the vertebral confluence and the PICA pathway. IMPRESSION: 1. Occlusion of the left middle cerebral artery at the origin of the M1 segment with complete left MCA territory infarct. 2. No hemorrhage or mass effect. 3. 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 No posterior circulation infarct, suggesting that this is chronic. 4. Approximately 50% stenosis of the proximal right internal carotid artery. Electronically Signed: By: Ulyses Jarred M.D. On: 07/01/2019 19:57   Mr Angiogram Neck W Or Wo Contrast  Addendum Date: 07/01/2019   ADDENDUM REPORT: 07/01/2019 20:38 ADDENDUM: These results were called by telephone at the time of interpretation on 07/01/2019 at 8:15 pm to Dr. Christia Reading OPYD, who verbally acknowledged these results. Electronically Signed   By: Ulyses Jarred M.D.   On: 07/01/2019 20:38   Result Date: 07/01/2019 CLINICAL DATA:  Found down at home.  History of atrial fibrillation. EXAM: MR HEAD WITHOUT CONTRAST MR CIRCLE OF WILLIS WITHOUT CONTRAST MRA OF THE NECK WITHOUT AND WITH CONTRAST TECHNIQUE: Multiplanar, multiecho pulse sequences of the brain, circle of willis and surrounding structures were obtained without intravenous contrast. Angiographic images of the neck were obtained using MRA technique without and with intravenous contrast. CONTRAST:  5 mL Gadavist COMPARISON:  Head CT same day FINDINGS: MRI HEAD FINDINGS BRAIN: The midline structures are normal. There is a large area of abnormal diffusion restriction within the left MCA territory. Early confluent hyperintense T2-weighted signal of the periventricular and deep white matter, most commonly due to chronic ischemic microangiopathy. Generalized atrophy without lobar predilection. Blood-sensitive sequences show no chronic microhemorrhage or superficial siderosis. SKULL AND UPPER CERVICAL SPINE:  The visualized skull base, calvarium, upper cervical spine and extracranial soft tissues are normal. SINUSES/ORBITS: No fluid levels or advanced mucosal thickening. No mastoid or middle ear effusion. The orbits are normal. MRA HEAD FINDINGS POSTERIOR CIRCULATION: --Vertebral arteries: Normal V4 segments. --Posterior inferior cerebellar arteries (PICA): Patent origins from the vertebral arteries. --Anterior inferior cerebellar arteries (AICA): Normal on the right. Not clearly visualized on the left. --Basilar artery: Normal. --Superior cerebellar arteries: Normal. --Posterior cerebral arteries: Normal. Both are predominantly supplied by the posterior communicating arteries (p-comm). ANTERIOR CIRCULATION: --Intracranial internal carotid arteries: Normal. --Anterior cerebral arteries (ACA): Normal. Both A1 segments are present. Patent anterior communicating artery (a-comm). --Middle cerebral arteries (MCA): There is complete occlusion of the left middle cerebral artery at the proximal M1 segment. Right MCA is normal. MRA NECK FINDINGS Aortic arch: Normal 3 vessel aortic branching pattern. The visualized subclavian arteries are normal. Right carotid system: Narrowing at the right carotid bifurcation results in approximately 50% stenosis of the proximal internal carotid artery. Left carotid system: Normal course and caliber without stenosis or evidence of dissection. Vertebral arteries: Right dominant. The left vertebral artery is occluded from its origin. There is reconstitution of the V4 segment secondary to collateral flow across the vertebral confluence and the PICA pathway. IMPRESSION: 1. Occlusion of the left middle cerebral artery at the origin of the M1 segment with complete left MCA territory infarct. 2. No hemorrhage or mass effect. 3. 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 No posterior circulation infarct, suggesting that this is  chronic. 4. Approximately 50% stenosis of the proximal right internal carotid artery. Electronically Signed: By: Lennette Bihari  [place] M.D. On: 07/01/2019 19:57   Mr Brain Wo Contrast  Addendum Date: 07/01/2019   ADDENDUM REPORT: 07/01/2019 20:38 ADDENDUM: These results were called by telephone at the time of interpretation on 07/01/2019 at 8:15 pm to Dr. Christia Reading OPYD, who verbally acknowledged these results. Electronically Signed   By: Ulyses Jarred M.D.   On: 07/01/2019 20:38   Result Date: 07/01/2019 CLINICAL DATA:  Found down at home.  History of atrial fibrillation. EXAM: MR HEAD WITHOUT CONTRAST MR CIRCLE OF WILLIS WITHOUT CONTRAST MRA OF THE NECK WITHOUT AND WITH CONTRAST TECHNIQUE: Multiplanar, multiecho pulse sequences of the brain, circle of willis and surrounding structures were obtained without intravenous contrast. Angiographic images of the neck were obtained using MRA technique without and with intravenous contrast. CONTRAST:  5 mL Gadavist COMPARISON:  Head CT same day FINDINGS: MRI HEAD FINDINGS BRAIN: The midline structures are normal. There is a large area of abnormal diffusion restriction within the left MCA territory. Early confluent hyperintense T2-weighted signal of the periventricular and deep white matter, most commonly due to chronic ischemic microangiopathy. Generalized atrophy without lobar predilection. Blood-sensitive sequences show no chronic microhemorrhage or superficial siderosis. SKULL AND UPPER CERVICAL SPINE: The visualized skull base, calvarium, upper cervical spine and extracranial soft tissues are normal. SINUSES/ORBITS: No fluid levels or advanced mucosal thickening. No mastoid or middle ear effusion. The orbits are normal. MRA HEAD FINDINGS POSTERIOR CIRCULATION: --Vertebral arteries: Normal V4 segments. --Posterior inferior cerebellar arteries (PICA): Patent origins from the vertebral arteries. --Anterior inferior cerebellar arteries (AICA): Normal on the right. Not clearly  visualized on the left. --Basilar artery: Normal. --Superior cerebellar arteries: Normal. --Posterior cerebral arteries: Normal. Both are predominantly supplied by the posterior communicating arteries (p-comm). ANTERIOR CIRCULATION: --Intracranial internal carotid arteries: Normal. --Anterior cerebral arteries (ACA): Normal. Both A1 segments are present. Patent anterior communicating artery (a-comm). --Middle cerebral arteries (MCA): There is complete occlusion of the left middle cerebral artery at the proximal M1 segment. Right MCA is normal. MRA NECK FINDINGS Aortic arch: Normal 3 vessel aortic branching pattern. The visualized subclavian arteries are normal. Right carotid system: Narrowing at the right carotid bifurcation results in approximately 50% stenosis of the proximal internal carotid artery. Left carotid system: Normal course and caliber without stenosis or evidence of dissection. Vertebral arteries: Right dominant. The left vertebral artery is occluded from its origin. There is reconstitution of the V4 segment secondary to collateral flow across the vertebral confluence and the PICA pathway. IMPRESSION: 1. Occlusion of the left middle cerebral artery at the origin of the M1 segment with complete left MCA territory infarct. 2. No hemorrhage or mass effect. 3. 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 No posterior circulation infarct, suggesting that this is chronic. 4. Approximately 50% stenosis of the proximal right internal carotid artery. Electronically Signed: By: Ulyses Jarred M.D. On: 07/01/2019 19:57   Dg Hip Unilat W Or Wo Pelvis 2-3 Views Right  Result Date: 07/01/2019 CLINICAL DATA:  Fall. EXAM: DG HIP (WITH OR WITHOUT PELVIS) 2-3V RIGHT COMPARISON:  Right hip x-rays dated May 17, 2019. FINDINGS: No acute fracture or dislocation. Unchanged end-stage right hip osteoarthritis with superolateral subluxation of the  femoral head. Osteopenia. Soft tissues are unremarkable. IMPRESSION: 1.  No acute osseous abnormality. 2. Unchanged end-stage right hip osteoarthritis. Electronically Signed   By: Titus Dubin M.D.   On: 07/01/2019 13:42        Scheduled Meds:   stroke: mapping  our early stages of recovery book   Does not apply Once   aspirin  300 mg Rectal Daily   enoxaparin (LOVENOX) injection  40 mg Subcutaneous Q24H   Continuous Infusions:  sodium chloride Stopped (07/02/19 1044)   diltiazem (CARDIZEM) infusion 10 mg/hr (07/02/19 0847)     LOS: 1 day    Time spent: 35 minutes    Elmarie Shiley, MD [facility] Pager [phone]  If 7PM-7AM, please contact night-coverage [email or web address] Password [email or web address] 07/02/2019, 11:26 AM

## 2019-07-02 NOTE — Consult Note (Signed)
Referring Physician:Tiffaney Heimann Doylene Canard, MD  Shannon Cruz is an 85 y.o. female.                       Chief Complaint: Acute left MCA stroke  HPI: 85 year old female with PMH of hypertension, arthritis, Bowing of both legs below the knee with unstable gait and permanent atrial fibrillation has acute left MCA stroke. Her LV function is low normal with mild LVH and severely dilated LA and RA with moderate MR and TR. She has large mobile atheroma in Ascending aorta near root and left main coronary artery origin. She is not a candidate for anticoagulation.  Past Medical History:  Diagnosis Date  . Arthritis   . H/O diastolic dysfunction   . Heart palpitations   . Hypertension   . RBBB (right bundle branch block) 11/20/2010   Echo - EF >55%; flow pattern suggestive of impaired LV relaxation, proximal septal thickening noted; mitral valve mildly thickened; mild mitral annular calcification; aortic root sclerosis/calcification      History reviewed. No pertinent surgical history.  No family history on file. Social History:  reports that she quit smoking about 36 years ago. She has never used smokeless tobacco. She reports that she does not drink alcohol or use drugs.  Allergies: No Known Allergies  Medications Prior to Admission  Medication Sig Dispense Refill  . acetaminophen (TYLENOL) 325 MG tablet Take 2 tablets (650 mg total) by mouth every 6 (six) hours as needed for mild pain (or Fever >/= 101).    Marland Kitchen aspirin 81 MG chewable tablet Chew 81 mg by mouth daily.    . bisacodyl (DULCOLAX) 10 MG suppository Place 1 suppository (10 mg total) rectally daily as needed for moderate constipation. 12 suppository 0  . diclofenac sodium (VOLTAREN) 1 % GEL Apply 2 g topically 4 (four) times daily. 200 g 1  . diltiazem (CARDIZEM) 30 MG tablet Take 1 tablet (30 mg total) by mouth every morning. (Patient taking differently: Take 30-60 mg by mouth every morning. )    . furosemide (LASIX) 20 MG tablet Take 1  tablet (20 mg total) by mouth every Monday, Wednesday, and Friday. 15 tablet 1  . hydrALAZINE (APRESOLINE) 25 MG tablet Take 1 tablet (25 mg total) by mouth 2 (two) times a day. 60 tablet 3  . mirtazapine (REMERON) 7.5 MG tablet Take 7.5 mg by mouth at bedtime.     . potassium chloride (K-DUR) 10 MEQ tablet Take 1 tablet (10 mEq total) by mouth every Monday, Wednesday, and Friday. 30 tablet 3  . amiodarone (PACERONE) 200 MG tablet Take 1 tablet (200 mg total) by mouth daily. 30 tablet 1  . diltiazem (CARDIZEM) 60 MG tablet Take 60 mg by mouth 2 (two) times daily. Had follow-up Appt. With Dr to decide if qd or bid    . metoprolol tartrate (LOPRESSOR) 25 MG tablet Take 1 tablet (25 mg total) by mouth 2 (two) times daily. (Patient not taking: Reported on 07/02/2019) 60 tablet 3    Results for orders placed or performed during the hospital encounter of 07/01/19 (from the past 48 hour(s))  CBC with Differential/Platelet     Status: Abnormal   Collection Time: 07/01/19  2:35 PM  Result Value Ref Range   WBC 12.3 (H) 4.0 - 10.5 K/uL   RBC 5.54 (H) 3.87 - 5.11 MIL/uL   Hemoglobin 14.3 12.0 - 15.0 g/dL   HCT 46.2 (H) 36.0 - 46.0 %   MCV 83.4  80.0 - 100.0 fL   MCH 25.8 (L) 26.0 - 34.0 pg   MCHC 31.0 30.0 - 36.0 g/dL   RDW 18.0 (H) 11.5 - 15.5 %   Platelets 368 150 - 400 K/uL   nRBC 0.0 0.0 - 0.2 %   Neutrophils Relative % 88 %   Neutro Abs 10.9 (H) 1.7 - 7.7 K/uL   Lymphocytes Relative 4 %   Lymphs Abs 0.5 (L) 0.7 - 4.0 K/uL   Monocytes Relative 7 %   Monocytes Absolute 0.8 0.1 - 1.0 K/uL   Eosinophils Relative 0 %   Eosinophils Absolute 0.0 0.0 - 0.5 K/uL   Basophils Relative 0 %   Basophils Absolute 0.0 0.0 - 0.1 K/uL   Immature Granulocytes 1 %   Abs Immature Granulocytes 0.06 0.00 - 0.07 K/uL    Comment: Performed at [facility] [address], [place], [place] [address]  Comprehensive metabolic panel     Status: Abnormal   Collection Time: 07/01/19  2:35 PM  Result Value Ref  Range   Sodium 138 135 - 145 mmol/L   Potassium 3.5 3.5 - 5.1 mmol/L   Chloride 103 98 - 111 mmol/L   CO2 21 (L) 22 - 32 mmol/L   Glucose, Bld 115 (H) 70 - 99 mg/dL   BUN 20 8 - 23 mg/dL   Creatinine, Ser 1.39 (H) 0.44 - 1.00 mg/dL   Calcium 9.5 8.9 - 10.3 mg/dL   Total Protein 7.4 6.5 - 8.1 g/dL   Albumin 3.3 (L) 3.5 - 5.0 g/dL   AST 46 (H) 15 - 41 U/L   ALT 15 0 - 44 U/L   Alkaline Phosphatase 206 (H) 38 - 126 U/L   Total Bilirubin 1.4 (H) 0.3 - 1.2 mg/dL   GFR calc non Af Amer 33 (L) >60 mL/min   GFR calc Af Amer 38 (L) >60 mL/min   Anion gap 14 5 - 15    Comment: Performed at [facility], [place] [address] [place], [place] [address]  Protime-INR     Status: None   Collection Time: 07/01/19  2:35 PM  Result Value Ref Range   Prothrombin Time 14.4 11.4 - 15.2 seconds   INR 1.1 0.8 - 1.2    Comment: (NOTE) INR goal varies based on device and disease states. Performed at [facility], [place] [address], [place], [place] [address]   CK     Status: Abnormal   Collection Time: 07/01/19  2:35 PM  Result Value Ref Range   Total CK 627 (H) 38 - 234 U/L    Comment: Performed at [facility], [place] [address] [place], [place] [address]  APTT     Status: Abnormal   Collection Time: 07/01/19  2:35 PM  Result Value Ref Range   aPTT 21 (L) 24 - 36 seconds    Comment: Performed at [facility], [place] [address], [place], [place] [address]  SARS CORONAVIRUS 2 (TAT 6-12 HRS) Nasal Swab Aptima Multi Swab     Status: None   Collection Time: 07/01/19  4:13 PM   Specimen: Aptima Multi Swab; Nasal Swab  Result Value Ref Range   SARS Coronavirus 2 NEGATIVE NEGATIVE    Comment: (NOTE) SARS-CoV-2 target nucleic acids are NOT DETECTED. The SARS-CoV-2 RNA is generally detectable in upper and lower respiratory specimens during the acute phase of infection. Negative results do not preclude SARS-CoV-2 infection, do not rule out co-infections with  other pathogens, and should not  be used as the sole basis for treatment or other patient management decisions. Negative results must be combined with clinical observations, patient history, and epidemiological information. The expected result is Negative. Fact Sheet for Patients: SugarRoll.be Fact Sheet for Healthcare Providers: https://www.woods-mathews.com/ This test is not yet approved or cleared by the [place] FDA and  has been authorized for detection and/or diagnosis of SARS-CoV-2 by FDA under an Emergency Use Authorization (EUA). This EUA will remain  in effect (meaning this test can be used) for the duration of the COVID-19 declaration under Section 56 4(b)(1) of the Act, 21 U.S.C. section 360bbb-3(b)(1), unless the authorization is terminated or revoked sooner. Performed at [facility], [place] [address] [place], [place] [address]   Hemoglobin A1c     Status: Abnormal   Collection Time: 07/02/19  4:25 AM  Result Value Ref Range   Hgb A1c MFr Bld 6.3 (H) 4.8 - 5.6 %    Comment: (NOTE) Pre diabetes:          5.7%-6.4% Diabetes:              >6.4% Glycemic control for   <7.0% adults with diabetes    Mean Plasma Glucose 134.11 mg/dL    Comment: Performed at [facility] [address] [place], [place] [address]  CBC with Differential/Platelet     Status: Abnormal   Collection Time: 07/02/19  4:25 AM  Result Value Ref Range   WBC 13.4 (H) 4.0 - 10.5 K/uL   RBC 5.33 (H) 3.87 - 5.11 MIL/uL   Hemoglobin 13.8 12.0 - 15.0 g/dL   HCT 43.6 36.0 - 46.0 %   MCV 81.8 80.0 - 100.0 fL   MCH 25.9 (L) 26.0 - 34.0 pg   MCHC 31.7 30.0 - 36.0 g/dL   RDW 17.9 (H) 11.5 - 15.5 %   Platelets 378 150 - 400 K/uL   nRBC 0.0 0.0 - 0.2 %   Neutrophils Relative % 84 %   Neutro Abs 11.2 (H) 1.7 - 7.7 K/uL   Lymphocytes Relative 5 %   Lymphs Abs 0.6 (L) 0.7 - 4.0 K/uL   Monocytes Relative 10 %   Monocytes Absolute 1.4 (H) 0.1 - 1.0 K/uL   Eosinophils Relative 0 %    Eosinophils Absolute 0.0 0.0 - 0.5 K/uL   Basophils Relative 0 %   Basophils Absolute 0.1 0.0 - 0.1 K/uL   Immature Granulocytes 1 %   Abs Immature Granulocytes 0.07 0.00 - 0.07 K/uL    Comment: Performed at [facility], [place] [address] [place], [place] [address]  Comprehensive metabolic panel     Status: Abnormal   Collection Time: 07/02/19  4:25 AM  Result Value Ref Range   Sodium 139 135 - 145 mmol/L   Potassium 3.9 3.5 - 5.1 mmol/L   Chloride 105 98 - 111 mmol/L   CO2 22 22 - 32 mmol/L   Glucose, Bld 111 (H) 70 - 99 mg/dL   BUN 22 8 - 23 mg/dL   Creatinine, Ser 1.24 (H) 0.44 - 1.00 mg/dL   Calcium 9.4 8.9 - 10.3 mg/dL   Total Protein 7.2 6.5 - 8.1 g/dL   Albumin 3.0 (L) 3.5 - 5.0 g/dL   AST 38 15 - 41 U/L   ALT 14 0 - 44 U/L   Alkaline Phosphatase 172 (H) 38 - 126 U/L   Total Bilirubin 1.5 (H) 0.3 - 1.2 mg/dL  GFR calc non Af Amer 38 (L) >60 mL/min   GFR calc Af Amer 44 (L) >60 mL/min   Anion gap 12 5 - 15    Comment: Performed at [facility] [address], [place], [place] [address]  Lipid panel     Status: Abnormal   Collection Time: 07/02/19  4:25 AM  Result Value Ref Range   Cholesterol 187 0 - 200 mg/dL   Triglycerides 73 <150 mg/dL   HDL 50 >40 mg/dL   Total CHOL/HDL Ratio 3.7 RATIO   VLDL 15 0 - 40 mg/dL   LDL Cholesterol 122 (H) 0 - 99 mg/dL    Comment:        Total Cholesterol/HDL:CHD Risk Coronary Heart Disease Risk Table                     Men   Women  1/2 Average Risk   3.4   3.3  Average Risk       5.0   4.4  2 X Average Risk   9.6   7.1  3 X Average Risk  23.4   11.0        Use the calculated Patient Ratio above and the CHD Risk Table to determine the patient's CHD Risk.        ATP III CLASSIFICATION (LDL):  <100     mg/dL   Optimal  100-129  mg/dL   Near or Above                    Optimal  130-159  mg/dL   Borderline  160-189  mg/dL   High  >190     mg/dL   Very High Performed at [facility] [address],  [place],  [address]   CBG monitoring, ED     Status: None   Collection Time: 07/02/19 10:43 AM  Result Value Ref Range   Glucose-Capillary 97 70 - 99 mg/dL  Glucose, capillary     Status: Abnormal   Collection Time: 07/02/19 12:32 PM  Result Value Ref Range   Glucose-Capillary 104 (H) 70 - 99 mg/dL   Dg Chest 1 View  Result Date: 07/01/2019 CLINICAL DATA:  Suspect fall. EXAM: CHEST  1 VIEW COMPARISON:  Chest x-ray dated May 17, 2019. FINDINGS: Unchanged moderate cardiomegaly. Normal pulmonary vascularity. No focal consolidation, pleural effusion, or pneumothorax. IMPRESSION: No active disease. Electronically Signed   By: Titus Dubin M.D.   On: 07/01/2019 13:39   Ct Head Wo Contrast  Result Date: 07/01/2019 CLINICAL DATA:  Altered level of consciousness, found down, last seen normal 2 days prior EXAM: CT HEAD WITHOUT CONTRAST CT CERVICAL SPINE WITHOUT CONTRAST TECHNIQUE: Multidetector CT imaging of the head and cervical spine was performed following the standard protocol without intravenous contrast. Multiplanar CT image reconstructions of the cervical spine were also generated. COMPARISON:  CT head 05/17/2019 FINDINGS: CT HEAD FINDINGS Brain: Geographic region of hypoattenuation within the left frontal lobe, caudate, internal capsule and lentiform nucleus compatible with a left MCA distribution infarct. No evidence of hemorrhage, hydrocephalus, extra-axial collection or mass lesion/mass effect. Patchy areas of white matter hypoattenuation are most compatible with chronic microvascular angiopathy. Symmetric prominence of the ventricles, cisterns and sulci compatible with parenchymal volume loss. Vascular: Hyperattenuation of the left MCA concerning for acute/subacute thrombus. Atherosclerotic calcification of the carotid siphons. Skull: No calvarial fracture or suspicious osseous lesion. No scalp swelling or hematoma. Sinuses/Orbits: Paranasal sinuses and mastoid  air cells are predominantly  clear. Orbital structures are unremarkable aside from prior lens extractions. Other: Edentulous with mandibular prognathism CT CERVICAL SPINE FINDINGS Alignment: Cervical stabilization collar in place. Slight reversal of the normal cervical lordosis centered at C5. Craniocervical and atlantoaxial alignment is maintained. Skull base and vertebrae: No acute fracture. No primary bone lesion or focal pathologic process. Extensive subcortical cystic changes noted in the dens. Soft tissues and spinal canal: No pre or paravertebral fluid or swelling. No visible canal hematoma. Disc levels: Multilevel intervertebral disc height loss with spondylitic endplate changes. Posterior disc osteophyte complexes are noted throughout the cervical spine resulting in at most mild canal stenosis. Uncinate spurring and facet hypertrophic changes result in multilevel neural foraminal narrowing, severe on the left at C2-3, C3-4 and C4-5 and mild severe on the right at C4-5 and C7-T1. Additional mild-to-moderate foraminal stenoses are seen diffusely Upper chest: Biapical pleuroparenchymal scarring. Upper lungs are otherwise clear. Extensive vascular calcification in the cervical carotids. Other: None. IMPRESSION: 1. Hyperattenuation of the left MCA concerning for acute/subacute thrombus. 2. Geographic region of hypoattenuation within the left frontal lobe, caudate, internal capsule, and lentiform nucleus compatible with a left MCA distribution infarct. 3. No scalp swelling or calvarial fracture. 4. No acute cervical spine fracture. 5. Moderate to severe multilevel degenerative changes throughout the cervical spine, detailed above. These results were called by telephone at the time of interpretation on 07/01/2019 at 3:47 pm to Dr. Roslynn Amble , who verbally acknowledged these results. Electronically Signed   By: Lovena Le M.D.   On: 07/01/2019 15:53   Ct Cervical Spine Wo Contrast  Result Date: 07/01/2019 CLINICAL DATA:  Altered level of  consciousness, found down, last seen normal 2 days prior EXAM: CT HEAD WITHOUT CONTRAST CT CERVICAL SPINE WITHOUT CONTRAST TECHNIQUE: Multidetector CT imaging of the head and cervical spine was performed following the standard protocol without intravenous contrast. Multiplanar CT image reconstructions of the cervical spine were also generated. COMPARISON:  CT head 05/17/2019 FINDINGS: CT HEAD FINDINGS Brain: Geographic region of hypoattenuation within the left frontal lobe, caudate, internal capsule and lentiform nucleus compatible with a left MCA distribution infarct. No evidence of hemorrhage, hydrocephalus, extra-axial collection or mass lesion/mass effect. Patchy areas of white matter hypoattenuation are most compatible with chronic microvascular angiopathy. Symmetric prominence of the ventricles, cisterns and sulci compatible with parenchymal volume loss. Vascular: Hyperattenuation of the left MCA concerning for acute/subacute thrombus. Atherosclerotic calcification of the carotid siphons. Skull: No calvarial fracture or suspicious osseous lesion. No scalp swelling or hematoma. Sinuses/Orbits: Paranasal sinuses and mastoid air cells are predominantly clear. Orbital structures are unremarkable aside from prior lens extractions. Other: Edentulous with mandibular prognathism CT CERVICAL SPINE FINDINGS Alignment: Cervical stabilization collar in place. Slight reversal of the normal cervical lordosis centered at C5. Craniocervical and atlantoaxial alignment is maintained. Skull base and vertebrae: No acute fracture. No primary bone lesion or focal pathologic process. Extensive subcortical cystic changes noted in the dens. Soft tissues and spinal canal: No pre or paravertebral fluid or swelling. No visible canal hematoma. Disc levels: Multilevel intervertebral disc height loss with spondylitic endplate changes. Posterior disc osteophyte complexes are noted throughout the cervical spine resulting in at most mild canal  stenosis. Uncinate spurring and facet hypertrophic changes result in multilevel neural foraminal narrowing, severe on the left at C2-3, C3-4 and C4-5 and mild severe on the right at C4-5 and C7-T1. Additional mild-to-moderate foraminal stenoses are seen diffusely Upper chest: Biapical pleuroparenchymal scarring. Upper lungs are otherwise clear.  Extensive vascular calcification in the cervical carotids. Other: None. IMPRESSION: 1. Hyperattenuation of the left MCA concerning for acute/subacute thrombus. 2. Geographic region of hypoattenuation within the left frontal lobe, caudate, internal capsule, and lentiform nucleus compatible with a left MCA distribution infarct. 3. No scalp swelling or calvarial fracture. 4. No acute cervical spine fracture. 5. Moderate to severe multilevel degenerative changes throughout the cervical spine, detailed above. These results were called by telephone at the time of interpretation on 07/01/2019 at 3:47 pm to Dr. Roslynn Amble , who verbally acknowledged these results. Electronically Signed   By: Lovena Le M.D.   On: 07/01/2019 15:53   Mr Angio Head Wo Contrast  Addendum Date: 07/01/2019   ADDENDUM REPORT: 07/01/2019 20:38 ADDENDUM: These results were called by telephone at the time of interpretation on 07/01/2019 at 8:15 pm to Dr. Christia Reading OPYD, who verbally acknowledged these results. Electronically Signed   By: Ulyses Jarred M.D.   On: 07/01/2019 20:38   Result Date: 07/01/2019 CLINICAL DATA:  Found down at home.  History of atrial fibrillation. EXAM: MR HEAD WITHOUT CONTRAST MR CIRCLE OF WILLIS WITHOUT CONTRAST MRA OF THE NECK WITHOUT AND WITH CONTRAST TECHNIQUE: Multiplanar, multiecho pulse sequences of the brain, circle of willis and surrounding structures were obtained without intravenous contrast. Angiographic images of the neck were obtained using MRA technique without and with intravenous contrast. CONTRAST:  5 mL Gadavist COMPARISON:  Head CT same day FINDINGS: MRI HEAD  FINDINGS BRAIN: The midline structures are normal. There is a large area of abnormal diffusion restriction within the left MCA territory. Early confluent hyperintense T2-weighted signal of the periventricular and deep white matter, most commonly due to chronic ischemic microangiopathy. Generalized atrophy without lobar predilection. Blood-sensitive sequences show no chronic microhemorrhage or superficial siderosis. SKULL AND UPPER CERVICAL SPINE: The visualized skull base, calvarium, upper cervical spine and extracranial soft tissues are normal. SINUSES/ORBITS: No fluid levels or advanced mucosal thickening. No mastoid or middle ear effusion. The orbits are normal. MRA HEAD FINDINGS POSTERIOR CIRCULATION: --Vertebral arteries: Normal V4 segments. --Posterior inferior cerebellar arteries (PICA): Patent origins from the vertebral arteries. --Anterior inferior cerebellar arteries (AICA): Normal on the right. Not clearly visualized on the left. --Basilar artery: Normal. --Superior cerebellar arteries: Normal. --Posterior cerebral arteries: Normal. Both are predominantly supplied by the posterior communicating arteries (p-comm). ANTERIOR CIRCULATION: --Intracranial internal carotid arteries: Normal. --Anterior cerebral arteries (ACA): Normal. Both A1 segments are present. Patent anterior communicating artery (a-comm). --Middle cerebral arteries (MCA): There is complete occlusion of the left middle cerebral artery at the proximal M1 segment. Right MCA is normal. MRA NECK FINDINGS Aortic arch: Normal 3 vessel aortic branching pattern. The visualized subclavian arteries are normal. Right carotid system: Narrowing at the right carotid bifurcation results in approximately 50% stenosis of the proximal internal carotid artery. Left carotid system: Normal course and caliber without stenosis or evidence of dissection. Vertebral arteries: Right dominant. The left vertebral artery is occluded from its origin. There is  reconstitution of the V4 segment secondary to collateral flow across the vertebral confluence and the PICA pathway. IMPRESSION: 1. Occlusion of the left middle cerebral artery at the origin of the M1 segment with complete left MCA territory infarct. 2. No hemorrhage or mass effect. 3. 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 No posterior circulation infarct, suggesting that this is chronic. 4. Approximately 50% stenosis of the proximal right internal carotid artery. Electronically Signed: By: Cletus Gash.D.  On: 07/01/2019 19:57   Mr Angiogram Neck W Or Wo Contrast  Addendum Date: 07/01/2019   ADDENDUM REPORT: 07/01/2019 20:38 ADDENDUM: These results were called by telephone at the time of interpretation on 07/01/2019 at 8:15 pm to Dr. Christia Reading OPYD, who verbally acknowledged these results. Electronically Signed   By: Ulyses Jarred M.D.   On: 07/01/2019 20:38   Result Date: 07/01/2019 CLINICAL DATA:  Found down at home.  History of atrial fibrillation. EXAM: MR HEAD WITHOUT CONTRAST MR CIRCLE OF WILLIS WITHOUT CONTRAST MRA OF THE NECK WITHOUT AND WITH CONTRAST TECHNIQUE: Multiplanar, multiecho pulse sequences of the brain, circle of willis and surrounding structures were obtained without intravenous contrast. Angiographic images of the neck were obtained using MRA technique without and with intravenous contrast. CONTRAST:  5 mL Gadavist COMPARISON:  Head CT same day FINDINGS: MRI HEAD FINDINGS BRAIN: The midline structures are normal. There is a large area of abnormal diffusion restriction within the left MCA territory. Early confluent hyperintense T2-weighted signal of the periventricular and deep white matter, most commonly due to chronic ischemic microangiopathy. Generalized atrophy without lobar predilection. Blood-sensitive sequences show no chronic microhemorrhage or superficial siderosis. SKULL AND UPPER CERVICAL SPINE: The  visualized skull base, calvarium, upper cervical spine and extracranial soft tissues are normal. SINUSES/ORBITS: No fluid levels or advanced mucosal thickening. No mastoid or middle ear effusion. The orbits are normal. MRA HEAD FINDINGS POSTERIOR CIRCULATION: --Vertebral arteries: Normal V4 segments. --Posterior inferior cerebellar arteries (PICA): Patent origins from the vertebral arteries. --Anterior inferior cerebellar arteries (AICA): Normal on the right. Not clearly visualized on the left. --Basilar artery: Normal. --Superior cerebellar arteries: Normal. --Posterior cerebral arteries: Normal. Both are predominantly supplied by the posterior communicating arteries (p-comm). ANTERIOR CIRCULATION: --Intracranial internal carotid arteries: Normal. --Anterior cerebral arteries (ACA): Normal. Both A1 segments are present. Patent anterior communicating artery (a-comm). --Middle cerebral arteries (MCA): There is complete occlusion of the left middle cerebral artery at the proximal M1 segment. Right MCA is normal. MRA NECK FINDINGS Aortic arch: Normal 3 vessel aortic branching pattern. The visualized subclavian arteries are normal. Right carotid system: Narrowing at the right carotid bifurcation results in approximately 50% stenosis of the proximal internal carotid artery. Left carotid system: Normal course and caliber without stenosis or evidence of dissection. Vertebral arteries: Right dominant. The left vertebral artery is occluded from its origin. There is reconstitution of the V4 segment secondary to collateral flow across the vertebral confluence and the PICA pathway. IMPRESSION: 1. Occlusion of the left middle cerebral artery at the origin of the M1 segment with complete left MCA territory infarct. 2. No hemorrhage or mass effect. 3. 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 No posterior circulation infarct, suggesting that this is chronic.  4. Approximately 50% stenosis of the proximal right internal carotid artery. Electronically Signed: By: Ulyses Jarred M.D. On: 07/01/2019 19:57   Mr Brain Wo Contrast  Addendum Date: 07/01/2019   ADDENDUM REPORT: 07/01/2019 20:38 ADDENDUM: These results were called by telephone at the time of interpretation on 07/01/2019 at 8:15 pm to Dr. Christia Reading OPYD, who verbally acknowledged these results. Electronically Signed   By: Ulyses Jarred M.D.   On: 07/01/2019 20:38   Result Date: 07/01/2019 CLINICAL DATA:  Found down at home.  History of atrial fibrillation. EXAM: MR HEAD WITHOUT CONTRAST MR CIRCLE OF WILLIS WITHOUT CONTRAST MRA OF THE NECK WITHOUT AND WITH CONTRAST TECHNIQUE: Multiplanar, multiecho pulse sequences of the brain,  circle of willis and surrounding structures were obtained without intravenous contrast. Angiographic images of the neck were obtained using MRA technique without and with intravenous contrast. CONTRAST:  5 mL Gadavist COMPARISON:  Head CT same day FINDINGS: MRI HEAD FINDINGS BRAIN: The midline structures are normal. There is a large area of abnormal diffusion restriction within the left MCA territory. Early confluent hyperintense T2-weighted signal of the periventricular and deep white matter, most commonly due to chronic ischemic microangiopathy. Generalized atrophy without lobar predilection. Blood-sensitive sequences show no chronic microhemorrhage or superficial siderosis. SKULL AND UPPER CERVICAL SPINE: The visualized skull base, calvarium, upper cervical spine and extracranial soft tissues are normal. SINUSES/ORBITS: No fluid levels or advanced mucosal thickening. No mastoid or middle ear effusion. The orbits are normal. MRA HEAD FINDINGS POSTERIOR CIRCULATION: --Vertebral arteries: Normal V4 segments. --Posterior inferior cerebellar arteries (PICA): Patent origins from the vertebral arteries. --Anterior inferior cerebellar arteries (AICA): Normal on the right. Not clearly visualized  on the left. --Basilar artery: Normal. --Superior cerebellar arteries: Normal. --Posterior cerebral arteries: Normal. Both are predominantly supplied by the posterior communicating arteries (p-comm). ANTERIOR CIRCULATION: --Intracranial internal carotid arteries: Normal. --Anterior cerebral arteries (ACA): Normal. Both A1 segments are present. Patent anterior communicating artery (a-comm). --Middle cerebral arteries (MCA): There is complete occlusion of the left middle cerebral artery at the proximal M1 segment. Right MCA is normal. MRA NECK FINDINGS Aortic arch: Normal 3 vessel aortic branching pattern. The visualized subclavian arteries are normal. Right carotid system: Narrowing at the right carotid bifurcation results in approximately 50% stenosis of the proximal internal carotid artery. Left carotid system: Normal course and caliber without stenosis or evidence of dissection. Vertebral arteries: Right dominant. The left vertebral artery is occluded from its origin. There is reconstitution of the V4 segment secondary to collateral flow across the vertebral confluence and the PICA pathway. IMPRESSION: 1. Occlusion of the left middle cerebral artery at the origin of the M1 segment with complete left MCA territory infarct. 2. No hemorrhage or mass effect. 3. 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 No posterior circulation infarct, suggesting that this is chronic. 4. Approximately 50% stenosis of the proximal right internal carotid artery. Electronically Signed: By: Ulyses Jarred M.D. On: 07/01/2019 19:57   Dg Hip Unilat W Or Wo Pelvis 2-3 Views Right  Result Date: 07/01/2019 CLINICAL DATA:  Fall. EXAM: DG HIP (WITH OR WITHOUT PELVIS) 2-3V RIGHT COMPARISON:  Right hip x-rays dated May 17, 2019. FINDINGS: No acute fracture or dislocation. Unchanged end-stage right hip osteoarthritis with superolateral subluxation of the femoral head.  Osteopenia. Soft tissues are unremarkable. IMPRESSION: 1.  No acute osseous abnormality. 2. Unchanged end-stage right hip osteoarthritis. Electronically Signed   By: Titus Dubin M.D.   On: 07/01/2019 13:42    Review Of Systems Constitutional: No fever, chills, weight loss or gain. Eyes: No vision change, wears glasses. No discharge or pain. Ears: No hearing loss, No tinnitus. Respiratory: No asthma, COPD, pneumonias. No shortness of breath. No hemoptysis. Cardiovascular: No chest pain,positive palpitation, leg edema. Gastrointestinal: No nausea, vomiting, diarrhea, constipation. No GI bleed. No hepatitis. Genitourinary: No dysuria, hematuria, kidney stone. No incontinance. Neurological: No headache, new stroke, no seizures.  Psychiatry: No psych facility admission for anxiety, depression, suicide. No detox. Skin: No rash. Musculoskeletal: Positive joint pain, fibromyalgia, neck pain, back pain. Lymphadenopathy: No lymphadenopathy. Hematology: No anemia or easy bruising.   Blood pressure (!) 153/73, pulse 73, temperature 98.8 F (  37.1 C), temperature source Oral, resp. rate 15, SpO2 100 %. There is no height or weight on file to calculate BMI. General appearance: Resting, appears stated age and no distress Head: Normocephalic, atraumatic. Eyes: Brown eyes, pink conjunctiva, corneas clear. Resist opening left eye. Neck: No adenopathy, no carotid bruit, no JVD, supple, symmetrical, trachea midline and thyroid not enlarged. Resp: Clear to auscultation bilaterally. Cardio: Irregular rate and rhythm, S1, S2 normal, II/VI systolic murmur, no click, rub or gallop GI: Soft, non-tender; bowel sounds normal; no organomegaly. Extremities: No edema, cyanosis or clubbing. Skin: Warm and dry.  Neurologic: Alert and oriented X 0, Right arm flaccid. Small motion of right leg at knee joint voluntarily.   Assessment/Plan Acute left MCA stroke with hemiplegia Chronic permanent atrial fibrillation,  [id number] score of 6 [place] atheromas in ascending aorta. Moderate MR and TR Right hip and knee severe osteoarthritis Severe multilevel cervical disc disease Hypertension  Agree with medical supportive care and hospice consult. Patient is not a candidate for anticoagulation.  Time spent: Review of old records, Lab, x-rays, EKG, other cardiac tests, examination, discussion with patient over 70 minutes.  Birdie Riddle, MD  07/02/2019, 6:14 PM

## 2019-07-02 NOTE — ED Notes (Signed)
ED TO INPATIENT HANDOFF REPORT  ED Nurse Name and Phone #: Ophelia Charter RN [phone]  S Name/Age/Gender Shannon Cruz 85 y.o. female Room/Bed: 026C/026C  Code Status   Code Status: Full Code  Home/SNF/Other Home Patient oriented to: Is this baseline? No   Triage Complete: Triage complete  Chief Complaint found on floor  Triage Note Pt found by neighbor lying on the floor on right side in fetal position neighbors last saw her 2 days ago. Abrasion to right forehead & eyes are swollen w/dc. Enter rotation of right hip and bruise on right shoulder.   Neighbor said her son usually helps her out and his number is in her chart.   Allergies No Known Allergies  Level of Care/Admitting Diagnosis ED Disposition    ED Disposition Condition [facility] Area: [place] [[id number]]  Level of Care: Progressive [102]  Covid Evaluation: Asymptomatic Screening Protocol (No Symptoms)  Diagnosis: CVA (cerebral vascular accident) [facility]) [[id number]]  Admitting Physician: Norval Morton [[id number]]  Attending Physician: Norval Morton [[id number]]  Estimated length of stay: past midnight tomorrow  Certification:: I certify this patient will need inpatient services for at least 2 midnights  PT Class (Do Not Modify): Inpatient [101]  PT Acc Code (Do Not Modify): Private [1]       B Medical/Surgery History Past Medical History:  Diagnosis Date  . Arthritis   . H/O diastolic dysfunction   . Heart palpitations   . Hypertension   . RBBB (right bundle branch block) 11/20/2010   Echo - EF >55%; flow pattern suggestive of impaired LV relaxation, proximal septal thickening noted; mitral valve mildly thickened; mild mitral annular calcification; aortic root sclerosis/calcification   History reviewed. No pertinent surgical history.   A IV Location/Drains/Wounds Patient Lines/Drains/Airways Status   Active Line/Drains/Airways    Name:   Placement date:   Placement  time:   Site:   Days:   Peripheral IV 07/01/19 Left Forearm   07/01/19    1253    Forearm   1          Intake/Output Last 24 hours  Intake/Output Summary (Last 24 hours) at 07/02/2019 0547 Last data filed at 07/01/2019 1417 Gross per 24 hour  Intake 500 ml  Output -  Net 500 ml    Labs/Imaging Results for orders placed or performed during the hospital encounter of 07/01/19 (from the past 48 hour(s))  CBC with Differential/Platelet     Status: Abnormal   Collection Time: 07/01/19  2:35 PM  Result Value Ref Range   WBC 12.3 (H) 4.0 - 10.5 K/uL   RBC 5.54 (H) 3.87 - 5.11 MIL/uL   Hemoglobin 14.3 12.0 - 15.0 g/dL   HCT 46.2 (H) 36.0 - 46.0 %   MCV 83.4 80.0 - 100.0 fL   MCH 25.8 (L) 26.0 - 34.0 pg   MCHC 31.0 30.0 - 36.0 g/dL   RDW 18.0 (H) 11.5 - 15.5 %   Platelets 368 150 - 400 K/uL   nRBC 0.0 0.0 - 0.2 %   Neutrophils Relative % 88 %   Neutro Abs 10.9 (H) 1.7 - 7.7 K/uL   Lymphocytes Relative 4 %   Lymphs Abs 0.5 (L) 0.7 - 4.0 K/uL   Monocytes Relative 7 %   Monocytes Absolute 0.8 0.1 - 1.0 K/uL   Eosinophils Relative 0 %   Eosinophils Absolute 0.0 0.0 - 0.5 K/uL   Basophils Relative 0 %   Basophils Absolute 0.0  0.0 - 0.1 K/uL   Immature Granulocytes 1 %   Abs Immature Granulocytes 0.06 0.00 - 0.07 K/uL    Comment: Performed at [facility], [place] [address] [place], [place] [address]  Comprehensive metabolic panel     Status: Abnormal   Collection Time: 07/01/19  2:35 PM  Result Value Ref Range   Sodium 138 135 - 145 mmol/L   Potassium 3.5 3.5 - 5.1 mmol/L   Chloride 103 98 - 111 mmol/L   CO2 21 (L) 22 - 32 mmol/L   Glucose, Bld 115 (H) 70 - 99 mg/dL   BUN 20 8 - 23 mg/dL   Creatinine, Ser 1.39 (H) 0.44 - 1.00 mg/dL   Calcium 9.5 8.9 - 10.3 mg/dL   Total Protein 7.4 6.5 - 8.1 g/dL   Albumin 3.3 (L) 3.5 - 5.0 g/dL   AST 46 (H) 15 - 41 U/L   ALT 15 0 - 44 U/L   Alkaline Phosphatase 206 (H) 38 - 126 U/L   Total Bilirubin 1.4 (H) 0.3 - 1.2 mg/dL   GFR calc  non Af Amer 33 (L) >60 mL/min   GFR calc Af Amer 38 (L) >60 mL/min   Anion gap 14 5 - 15    Comment: Performed at [facility], [place] [address], [place], [place] [address]  Protime-INR     Status: None   Collection Time: 07/01/19  2:35 PM  Result Value Ref Range   Prothrombin Time 14.4 11.4 - 15.2 seconds   INR 1.1 0.8 - 1.2    Comment: (NOTE) INR goal varies based on device and disease states. Performed at [facility], [place] [address] [place], [place] [address]   CK     Status: Abnormal   Collection Time: 07/01/19  2:35 PM  Result Value Ref Range   Total CK 627 (H) 38 - 234 U/L    Comment: Performed at [facility], [place] [address] [place], [place] [address]  APTT     Status: Abnormal   Collection Time: 07/01/19  2:35 PM  Result Value Ref Range   aPTT 21 (L) 24 - 36 seconds    Comment: Performed at [facility], [place] [address], [place], [place] [address]  SARS CORONAVIRUS 2 (TAT 6-12 HRS) Nasal Swab Aptima Multi Swab     Status: None   Collection Time: 07/01/19  4:13 PM   Specimen: Aptima Multi Swab; Nasal Swab  Result Value Ref Range   SARS Coronavirus 2 NEGATIVE NEGATIVE    Comment: (NOTE) SARS-CoV-2 target nucleic acids are NOT DETECTED. The SARS-CoV-2 RNA is generally detectable in upper and lower respiratory specimens during the acute phase of infection. Negative results do not preclude SARS-CoV-2 infection, do not rule out co-infections with other pathogens, and should not be used as the sole basis for treatment or other patient management decisions. Negative results must be combined with clinical observations, patient history, and epidemiological information. The expected result is Negative. Fact Sheet for Patients: SugarRoll.be Fact Sheet for Healthcare Providers: https://www.woods-mathews.com/ This test is not yet approved or cleared by the [place] FDA and  has been authorized for detection  and/or diagnosis of SARS-CoV-2 by FDA under an Emergency Use Authorization (EUA). This EUA will remain  in effect (meaning this test can be used) for the duration of the COVID-19 declaration under Section 56 4(b)(1) of the Act, 21 U.S.C. section 360bbb-3(b)(1), unless the authorization is terminated or revoked sooner. Performed  at [facility], [place] [address] [place], [place] [address]   CBC with Differential/Platelet     Status: Abnormal   Collection Time: 07/02/19  4:25 AM  Result Value Ref Range   WBC 13.4 (H) 4.0 - 10.5 K/uL   RBC 5.33 (H) 3.87 - 5.11 MIL/uL   Hemoglobin 13.8 12.0 - 15.0 g/dL   HCT 43.6 36.0 - 46.0 %   MCV 81.8 80.0 - 100.0 fL   MCH 25.9 (L) 26.0 - 34.0 pg   MCHC 31.7 30.0 - 36.0 g/dL   RDW 17.9 (H) 11.5 - 15.5 %   Platelets 378 150 - 400 K/uL   nRBC 0.0 0.0 - 0.2 %   Neutrophils Relative % 84 %   Neutro Abs 11.2 (H) 1.7 - 7.7 K/uL   Lymphocytes Relative 5 %   Lymphs Abs 0.6 (L) 0.7 - 4.0 K/uL   Monocytes Relative 10 %   Monocytes Absolute 1.4 (H) 0.1 - 1.0 K/uL   Eosinophils Relative 0 %   Eosinophils Absolute 0.0 0.0 - 0.5 K/uL   Basophils Relative 0 %   Basophils Absolute 0.1 0.0 - 0.1 K/uL   Immature Granulocytes 1 %   Abs Immature Granulocytes 0.07 0.00 - 0.07 K/uL    Comment: Performed at [facility], [place] [address], [place], [place] [address]  Comprehensive metabolic panel     Status: Abnormal   Collection Time: 07/02/19  4:25 AM  Result Value Ref Range   Sodium 139 135 - 145 mmol/L   Potassium 3.9 3.5 - 5.1 mmol/L   Chloride 105 98 - 111 mmol/L   CO2 22 22 - 32 mmol/L   Glucose, Bld 111 (H) 70 - 99 mg/dL   BUN 22 8 - 23 mg/dL   Creatinine, Ser 1.24 (H) 0.44 - 1.00 mg/dL   Calcium 9.4 8.9 - 10.3 mg/dL   Total Protein 7.2 6.5 - 8.1 g/dL   Albumin 3.0 (L) 3.5 - 5.0 g/dL   AST 38 15 - 41 U/L   ALT 14 0 - 44 U/L   Alkaline Phosphatase 172 (H) 38 - 126 U/L   Total Bilirubin 1.5 (H) 0.3 - 1.2 mg/dL   GFR calc non Af Amer 38 (L)  >60 mL/min   GFR calc Af Amer 44 (L) >60 mL/min   Anion gap 12 5 - 15    Comment: Performed at [facility] [address] [place], [place] [address]  Lipid panel     Status: Abnormal   Collection Time: 07/02/19  4:25 AM  Result Value Ref Range   Cholesterol 187 0 - 200 mg/dL   Triglycerides 73 <150 mg/dL   HDL 50 >40 mg/dL   Total CHOL/HDL Ratio 3.7 RATIO   VLDL 15 0 - 40 mg/dL   LDL Cholesterol 122 (H) 0 - 99 mg/dL    Comment:        Total Cholesterol/HDL:CHD Risk Coronary Heart Disease Risk Table                     Men   Women  1/2 Average Risk   3.4   3.3  Average Risk       5.0   4.4  2 X Average Risk   9.6   7.1  3 X Average Risk  23.4   11.0        Use the calculated Patient Ratio above and the CHD Risk Table to determine the patient's  CHD Risk.        ATP III CLASSIFICATION (LDL):  <100     mg/dL   Optimal  100-129  mg/dL   Near or Above                    Optimal  130-159  mg/dL   Borderline  160-189  mg/dL   High  >190     mg/dL   Very High Performed at [facility] [address], [place], [place] [address]    Dg Chest 1 View  Result Date: 07/01/2019 CLINICAL DATA:  Suspect fall. EXAM: CHEST  1 VIEW COMPARISON:  Chest x-ray dated May 17, 2019. FINDINGS: Unchanged moderate cardiomegaly. Normal pulmonary vascularity. No focal consolidation, pleural effusion, or pneumothorax. IMPRESSION: No active disease. Electronically Signed   By: Titus Dubin M.D.   On: 07/01/2019 13:39   Ct Head Wo Contrast  Result Date: 07/01/2019 CLINICAL DATA:  Altered level of consciousness, found down, last seen normal 2 days prior EXAM: CT HEAD WITHOUT CONTRAST CT CERVICAL SPINE WITHOUT CONTRAST TECHNIQUE: Multidetector CT imaging of the head and cervical spine was performed following the standard protocol without intravenous contrast. Multiplanar CT image reconstructions of the cervical spine were also generated. COMPARISON:  CT head 05/17/2019 FINDINGS: CT HEAD  FINDINGS Brain: Geographic region of hypoattenuation within the left frontal lobe, caudate, internal capsule and lentiform nucleus compatible with a left MCA distribution infarct. No evidence of hemorrhage, hydrocephalus, extra-axial collection or mass lesion/mass effect. Patchy areas of white matter hypoattenuation are most compatible with chronic microvascular angiopathy. Symmetric prominence of the ventricles, cisterns and sulci compatible with parenchymal volume loss. Vascular: Hyperattenuation of the left MCA concerning for acute/subacute thrombus. Atherosclerotic calcification of the carotid siphons. Skull: No calvarial fracture or suspicious osseous lesion. No scalp swelling or hematoma. Sinuses/Orbits: Paranasal sinuses and mastoid air cells are predominantly clear. Orbital structures are unremarkable aside from prior lens extractions. Other: Edentulous with mandibular prognathism CT CERVICAL SPINE FINDINGS Alignment: Cervical stabilization collar in place. Slight reversal of the normal cervical lordosis centered at C5. Craniocervical and atlantoaxial alignment is maintained. Skull base and vertebrae: No acute fracture. No primary bone lesion or focal pathologic process. Extensive subcortical cystic changes noted in the dens. Soft tissues and spinal canal: No pre or paravertebral fluid or swelling. No visible canal hematoma. Disc levels: Multilevel intervertebral disc height loss with spondylitic endplate changes. Posterior disc osteophyte complexes are noted throughout the cervical spine resulting in at most mild canal stenosis. Uncinate spurring and facet hypertrophic changes result in multilevel neural foraminal narrowing, severe on the left at C2-3, C3-4 and C4-5 and mild severe on the right at C4-5 and C7-T1. Additional mild-to-moderate foraminal stenoses are seen diffusely Upper chest: Biapical pleuroparenchymal scarring. Upper lungs are otherwise clear. Extensive vascular calcification in the cervical  carotids. Other: None. IMPRESSION: 1. Hyperattenuation of the left MCA concerning for acute/subacute thrombus. 2. Geographic region of hypoattenuation within the left frontal lobe, caudate, internal capsule, and lentiform nucleus compatible with a left MCA distribution infarct. 3. No scalp swelling or calvarial fracture. 4. No acute cervical spine fracture. 5. Moderate to severe multilevel degenerative changes throughout the cervical spine, detailed above. These results were called by telephone at the time of interpretation on 07/01/2019 at 3:47 pm to Dr. Roslynn Amble , who verbally acknowledged these results. Electronically Signed   By: Lovena Le M.D.   On: 07/01/2019 15:53   Ct Cervical Spine Wo Contrast  Result Date: 07/01/2019  CLINICAL DATA:  Altered level of consciousness, found down, last seen normal 2 days prior EXAM: CT HEAD WITHOUT CONTRAST CT CERVICAL SPINE WITHOUT CONTRAST TECHNIQUE: Multidetector CT imaging of the head and cervical spine was performed following the standard protocol without intravenous contrast. Multiplanar CT image reconstructions of the cervical spine were also generated. COMPARISON:  CT head 05/17/2019 FINDINGS: CT HEAD FINDINGS Brain: Geographic region of hypoattenuation within the left frontal lobe, caudate, internal capsule and lentiform nucleus compatible with a left MCA distribution infarct. No evidence of hemorrhage, hydrocephalus, extra-axial collection or mass lesion/mass effect. Patchy areas of white matter hypoattenuation are most compatible with chronic microvascular angiopathy. Symmetric prominence of the ventricles, cisterns and sulci compatible with parenchymal volume loss. Vascular: Hyperattenuation of the left MCA concerning for acute/subacute thrombus. Atherosclerotic calcification of the carotid siphons. Skull: No calvarial fracture or suspicious osseous lesion. No scalp swelling or hematoma. Sinuses/Orbits: Paranasal sinuses and mastoid air cells are predominantly  clear. Orbital structures are unremarkable aside from prior lens extractions. Other: Edentulous with mandibular prognathism CT CERVICAL SPINE FINDINGS Alignment: Cervical stabilization collar in place. Slight reversal of the normal cervical lordosis centered at C5. Craniocervical and atlantoaxial alignment is maintained. Skull base and vertebrae: No acute fracture. No primary bone lesion or focal pathologic process. Extensive subcortical cystic changes noted in the dens. Soft tissues and spinal canal: No pre or paravertebral fluid or swelling. No visible canal hematoma. Disc levels: Multilevel intervertebral disc height loss with spondylitic endplate changes. Posterior disc osteophyte complexes are noted throughout the cervical spine resulting in at most mild canal stenosis. Uncinate spurring and facet hypertrophic changes result in multilevel neural foraminal narrowing, severe on the left at C2-3, C3-4 and C4-5 and mild severe on the right at C4-5 and C7-T1. Additional mild-to-moderate foraminal stenoses are seen diffusely Upper chest: Biapical pleuroparenchymal scarring. Upper lungs are otherwise clear. Extensive vascular calcification in the cervical carotids. Other: None. IMPRESSION: 1. Hyperattenuation of the left MCA concerning for acute/subacute thrombus. 2. Geographic region of hypoattenuation within the left frontal lobe, caudate, internal capsule, and lentiform nucleus compatible with a left MCA distribution infarct. 3. No scalp swelling or calvarial fracture. 4. No acute cervical spine fracture. 5. Moderate to severe multilevel degenerative changes throughout the cervical spine, detailed above. These results were called by telephone at the time of interpretation on 07/01/2019 at 3:47 pm to Dr. Roslynn Amble , who verbally acknowledged these results. Electronically Signed   By: Lovena Le M.D.   On: 07/01/2019 15:53   Mr Angio Head Wo Contrast  Addendum Date: 07/01/2019   ADDENDUM REPORT: 07/01/2019 20:38  ADDENDUM: These results were called by telephone at the time of interpretation on 07/01/2019 at 8:15 pm to Dr. Christia Reading OPYD, who verbally acknowledged these results. Electronically Signed   By: Ulyses Jarred M.D.   On: 07/01/2019 20:38   Result Date: 07/01/2019 CLINICAL DATA:  Found down at home.  History of atrial fibrillation. EXAM: MR HEAD WITHOUT CONTRAST MR CIRCLE OF WILLIS WITHOUT CONTRAST MRA OF THE NECK WITHOUT AND WITH CONTRAST TECHNIQUE: Multiplanar, multiecho pulse sequences of the brain, circle of willis and surrounding structures were obtained without intravenous contrast. Angiographic images of the neck were obtained using MRA technique without and with intravenous contrast. CONTRAST:  5 mL Gadavist COMPARISON:  Head CT same day FINDINGS: MRI HEAD FINDINGS BRAIN: The midline structures are normal. There is a large area of abnormal diffusion restriction within the left MCA territory. Early confluent hyperintense T2-weighted signal of the periventricular and deep  white matter, most commonly due to chronic ischemic microangiopathy. Generalized atrophy without lobar predilection. Blood-sensitive sequences show no chronic microhemorrhage or superficial siderosis. SKULL AND UPPER CERVICAL SPINE: The visualized skull base, calvarium, upper cervical spine and extracranial soft tissues are normal. SINUSES/ORBITS: No fluid levels or advanced mucosal thickening. No mastoid or middle ear effusion. The orbits are normal. MRA HEAD FINDINGS POSTERIOR CIRCULATION: --Vertebral arteries: Normal V4 segments. --Posterior inferior cerebellar arteries (PICA): Patent origins from the vertebral arteries. --Anterior inferior cerebellar arteries (AICA): Normal on the right. Not clearly visualized on the left. --Basilar artery: Normal. --Superior cerebellar arteries: Normal. --Posterior cerebral arteries: Normal. Both are predominantly supplied by the posterior communicating arteries (p-comm). ANTERIOR CIRCULATION:  --Intracranial internal carotid arteries: Normal. --Anterior cerebral arteries (ACA): Normal. Both A1 segments are present. Patent anterior communicating artery (a-comm). --Middle cerebral arteries (MCA): There is complete occlusion of the left middle cerebral artery at the proximal M1 segment. Right MCA is normal. MRA NECK FINDINGS Aortic arch: Normal 3 vessel aortic branching pattern. The visualized subclavian arteries are normal. Right carotid system: Narrowing at the right carotid bifurcation results in approximately 50% stenosis of the proximal internal carotid artery. Left carotid system: Normal course and caliber without stenosis or evidence of dissection. Vertebral arteries: Right dominant. The left vertebral artery is occluded from its origin. There is reconstitution of the V4 segment secondary to collateral flow across the vertebral confluence and the PICA pathway. IMPRESSION: 1. Occlusion of the left middle cerebral artery at the origin of the M1 segment with complete left MCA territory infarct. 2. No hemorrhage or mass effect. 3. 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 No posterior circulation infarct, suggesting that this is chronic. 4. Approximately 50% stenosis of the proximal right internal carotid artery. Electronically Signed: By: Ulyses Jarred M.D. On: 07/01/2019 19:57   Mr Angiogram Neck W Or Wo Contrast  Addendum Date: 07/01/2019   ADDENDUM REPORT: 07/01/2019 20:38 ADDENDUM: These results were called by telephone at the time of interpretation on 07/01/2019 at 8:15 pm to Dr. Christia Reading OPYD, who verbally acknowledged these results. Electronically Signed   By: Ulyses Jarred M.D.   On: 07/01/2019 20:38   Result Date: 07/01/2019 CLINICAL DATA:  Found down at home.  History of atrial fibrillation. EXAM: MR HEAD WITHOUT CONTRAST MR CIRCLE OF WILLIS WITHOUT CONTRAST MRA OF THE NECK WITHOUT AND WITH CONTRAST TECHNIQUE:  Multiplanar, multiecho pulse sequences of the brain, circle of willis and surrounding structures were obtained without intravenous contrast. Angiographic images of the neck were obtained using MRA technique without and with intravenous contrast. CONTRAST:  5 mL Gadavist COMPARISON:  Head CT same day FINDINGS: MRI HEAD FINDINGS BRAIN: The midline structures are normal. There is a large area of abnormal diffusion restriction within the left MCA territory. Early confluent hyperintense T2-weighted signal of the periventricular and deep white matter, most commonly due to chronic ischemic microangiopathy. Generalized atrophy without lobar predilection. Blood-sensitive sequences show no chronic microhemorrhage or superficial siderosis. SKULL AND UPPER CERVICAL SPINE: The visualized skull base, calvarium, upper cervical spine and extracranial soft tissues are normal. SINUSES/ORBITS: No fluid levels or advanced mucosal thickening. No mastoid or middle ear effusion. The orbits are normal. MRA HEAD FINDINGS POSTERIOR CIRCULATION: --Vertebral arteries: Normal V4 segments. --Posterior inferior cerebellar arteries (PICA): Patent origins from the vertebral arteries. --Anterior inferior cerebellar arteries (AICA): Normal on the right. Not clearly visualized on the left. --Basilar artery: Normal. --Superior cerebellar arteries: Normal. --  Posterior cerebral arteries: Normal. Both are predominantly supplied by the posterior communicating arteries (p-comm). ANTERIOR CIRCULATION: --Intracranial internal carotid arteries: Normal. --Anterior cerebral arteries (ACA): Normal. Both A1 segments are present. Patent anterior communicating artery (a-comm). --Middle cerebral arteries (MCA): There is complete occlusion of the left middle cerebral artery at the proximal M1 segment. Right MCA is normal. MRA NECK FINDINGS Aortic arch: Normal 3 vessel aortic branching pattern. The visualized subclavian arteries are normal. Right carotid system:  Narrowing at the right carotid bifurcation results in approximately 50% stenosis of the proximal internal carotid artery. Left carotid system: Normal course and caliber without stenosis or evidence of dissection. Vertebral arteries: Right dominant. The left vertebral artery is occluded from its origin. There is reconstitution of the V4 segment secondary to collateral flow across the vertebral confluence and the PICA pathway. IMPRESSION: 1. Occlusion of the left middle cerebral artery at the origin of the M1 segment with complete left MCA territory infarct. 2. No hemorrhage or mass effect. 3. 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 No posterior circulation infarct, suggesting that this is chronic. 4. Approximately 50% stenosis of the proximal right internal carotid artery. Electronically Signed: By: Ulyses Jarred M.D. On: 07/01/2019 19:57   Mr Brain Wo Contrast  Addendum Date: 07/01/2019   ADDENDUM REPORT: 07/01/2019 20:38 ADDENDUM: These results were called by telephone at the time of interpretation on 07/01/2019 at 8:15 pm to Dr. Christia Reading OPYD, who verbally acknowledged these results. Electronically Signed   By: Ulyses Jarred M.D.   On: 07/01/2019 20:38   Result Date: 07/01/2019 CLINICAL DATA:  Found down at home.  History of atrial fibrillation. EXAM: MR HEAD WITHOUT CONTRAST MR CIRCLE OF WILLIS WITHOUT CONTRAST MRA OF THE NECK WITHOUT AND WITH CONTRAST TECHNIQUE: Multiplanar, multiecho pulse sequences of the brain, circle of willis and surrounding structures were obtained without intravenous contrast. Angiographic images of the neck were obtained using MRA technique without and with intravenous contrast. CONTRAST:  5 mL Gadavist COMPARISON:  Head CT same day FINDINGS: MRI HEAD FINDINGS BRAIN: The midline structures are normal. There is a large area of abnormal diffusion restriction within the left MCA territory. Early confluent  hyperintense T2-weighted signal of the periventricular and deep white matter, most commonly due to chronic ischemic microangiopathy. Generalized atrophy without lobar predilection. Blood-sensitive sequences show no chronic microhemorrhage or superficial siderosis. SKULL AND UPPER CERVICAL SPINE: The visualized skull base, calvarium, upper cervical spine and extracranial soft tissues are normal. SINUSES/ORBITS: No fluid levels or advanced mucosal thickening. No mastoid or middle ear effusion. The orbits are normal. MRA HEAD FINDINGS POSTERIOR CIRCULATION: --Vertebral arteries: Normal V4 segments. --Posterior inferior cerebellar arteries (PICA): Patent origins from the vertebral arteries. --Anterior inferior cerebellar arteries (AICA): Normal on the right. Not clearly visualized on the left. --Basilar artery: Normal. --Superior cerebellar arteries: Normal. --Posterior cerebral arteries: Normal. Both are predominantly supplied by the posterior communicating arteries (p-comm). ANTERIOR CIRCULATION: --Intracranial internal carotid arteries: Normal. --Anterior cerebral arteries (ACA): Normal. Both A1 segments are present. Patent anterior communicating artery (a-comm). --Middle cerebral arteries (MCA): There is complete occlusion of the left middle cerebral artery at the proximal M1 segment. Right MCA is normal. MRA NECK FINDINGS Aortic arch: Normal 3 vessel aortic branching pattern. The visualized subclavian arteries are normal. Right carotid system: Narrowing at the right carotid bifurcation results in approximately 50% stenosis of the proximal internal carotid artery. Left carotid system: Normal course and caliber without stenosis or  evidence of dissection. Vertebral arteries: Right dominant. The left vertebral artery is occluded from its origin. There is reconstitution of the V4 segment secondary to collateral flow across the vertebral confluence and the PICA pathway. IMPRESSION: 1. 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 2. No hemorrhage or mass effect. 3. 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 No posterior circulation infarct, suggesting that this is chronic. 4. Approximately 50% stenosis of the proximal right internal carotid artery. Electronically Signed: By: Ulyses Jarred M.D. On: 07/01/2019 19:57   Dg Hip Unilat W Or Wo Pelvis 2-3 Views Right  Result Date: 07/01/2019 CLINICAL DATA:  Fall. EXAM: DG HIP (WITH OR WITHOUT PELVIS) 2-3V RIGHT COMPARISON:  Right hip x-rays dated May 17, 2019. FINDINGS: No acute fracture or dislocation. Unchanged end-stage right hip osteoarthritis with superolateral subluxation of the femoral head. Osteopenia. Soft tissues are unremarkable. IMPRESSION: 1.  No acute osseous abnormality. 2. Unchanged end-stage right hip osteoarthritis. Electronically Signed   By: Titus Dubin M.D.   On: 07/01/2019 13:42    Pending Labs Unresulted Labs (From admission, onward)    Start     Ordered   07/02/19 0500  Hemoglobin A1c  Tomorrow morning,   R     07/01/19 1728   07/01/19 1301  Urinalysis, Routine w reflex microscopic  Once,   STAT     07/01/19 1302          Vitals/Pain Today's Vitals   07/02/19 0015 07/02/19 0022 07/02/19 0030 07/02/19 0334  BP: (!) 178/88  (!) 154/84 (!) 172/85  Pulse: 99  72 76  Resp:   18 16  Temp:      TempSrc:      SpO2: 99%  99% 100%  PainSc:  Asleep      Isolation Precautions No active isolations  Medications Medications  diltiazem (CARDIZEM) 100 mg in dextrose 5% 171mL (1 mg/mL) infusion (12.5 mg/hr Intravenous Rate/Dose Verify 07/02/19 0036)   stroke: mapping our early stages of recovery book (has no administration in time range)  acetaminophen (TYLENOL) tablet 650 mg (has no administration in time range)    Or  acetaminophen (TYLENOL) solution 650 mg (has no administration in time  range)    Or  acetaminophen (TYLENOL) suppository 650 mg (has no administration in time range)  senna-docusate (Senokot-S) tablet 1 tablet (has no administration in time range)  enoxaparin (LOVENOX) injection 40 mg (40 mg Subcutaneous Given 07/01/19 2033)  0.9 %  sodium chloride infusion ( Intravenous New Bag/Given 07/01/19 1943)  aspirin suppository 300 mg (300 mg Rectal Given 07/01/19 1949)  diclofenac sodium (VOLTAREN) 1 % transdermal gel 2 g (has no administration in time range)  sodium chloride 0.9 % bolus 500 mL (0 mLs Intravenous Stopped 07/01/19 1417)  gadobutrol (GADAVIST) 1 MMOL/ML injection 5 mL (5 mLs Intravenous Contrast Given 07/01/19 1904)    Mobility walks High fall risk   Focused Assessments    R Recommendations: See Admitting Provider Note  Report given to:   Additional Notes:

## 2019-07-02 NOTE — Progress Notes (Signed)
STROKE TEAM PROGRESS NOTE   INTERVAL HISTORY Patient RN at bedside.  Patient lying in bed, unresponsive, left arm flexed, right hemiplegia, eyes closed, nonverbal, not following commands, resistance to eye opening.  MRI showed left large MCA infarct.  Vitals:   07/02/19 0530 07/02/19 0630 07/02/19 0700 07/02/19 0800  BP: 140/72 (!) 160/87 (!) 147/70 (!) 161/75  Pulse: 69 77 78 64  Resp: (!) 5 (!) 0 (!) 0 (!) 0  Temp:      TempSrc:      SpO2: 99% 99% 99% 99%    CBC:  Recent Labs  Lab 07/01/19 1435 07/02/19 0425  WBC 12.3* 13.4*  NEUTROABS 10.9* 11.2*  HGB 14.3 13.8  HCT 46.2* 43.6  MCV 83.4 81.8  PLT 368 956    Basic Metabolic Panel:  Recent Labs  Lab 07/01/19 1435 07/02/19 0425  NA 138 139  K 3.5 3.9  CL 103 105  CO2 21* 22  GLUCOSE 115* 111*  BUN 20 22  CREATININE 1.39* 1.24*  CALCIUM 9.5 9.4   Lipid Panel:     Component Value Date/Time   CHOL 187 07/02/2019 0425   TRIG 73 07/02/2019 0425   HDL 50 07/02/2019 0425   CHOLHDL 3.7 07/02/2019 0425   VLDL 15 07/02/2019 0425   LDLCALC 122 (H) 07/02/2019 0425   HgbA1c:  Lab Results  Component Value Date   HGBA1C 6.3 (H) 07/02/2019   Urine Drug Screen: No results found for: LABOPIA, COCAINSCRNUR, LABBENZ, AMPHETMU, THCU, LABBARB  Alcohol Level No results found for: [facility]  IMAGING Dg Chest 1 View  Result Date: 07/01/2019 CLINICAL DATA:  Suspect fall. EXAM: CHEST  1 VIEW COMPARISON:  Chest x-ray dated May 17, 2019. FINDINGS: Unchanged moderate cardiomegaly. Normal pulmonary vascularity. No focal consolidation, pleural effusion, or pneumothorax. IMPRESSION: No active disease. Electronically Signed   By: Titus Dubin M.D.   On: 07/01/2019 13:39   Ct Head Wo Contrast  Result Date: 07/01/2019 CLINICAL DATA:  Altered level of consciousness, found down, last seen normal 2 days prior EXAM: CT HEAD WITHOUT CONTRAST CT CERVICAL SPINE WITHOUT CONTRAST TECHNIQUE: Multidetector CT imaging of the head and cervical spine  was performed following the standard protocol without intravenous contrast. Multiplanar CT image reconstructions of the cervical spine were also generated. COMPARISON:  CT head 05/17/2019 FINDINGS: CT HEAD FINDINGS Brain: Geographic region of hypoattenuation within the left frontal lobe, caudate, internal capsule and lentiform nucleus compatible with a left MCA distribution infarct. No evidence of hemorrhage, hydrocephalus, extra-axial collection or mass lesion/mass effect. Patchy areas of white matter hypoattenuation are most compatible with chronic microvascular angiopathy. Symmetric prominence of the ventricles, cisterns and sulci compatible with parenchymal volume loss. Vascular: Hyperattenuation of the left MCA concerning for acute/subacute thrombus. Atherosclerotic calcification of the carotid siphons. Skull: No calvarial fracture or suspicious osseous lesion. No scalp swelling or hematoma. Sinuses/Orbits: Paranasal sinuses and mastoid air cells are predominantly clear. Orbital structures are unremarkable aside from prior lens extractions. Other: Edentulous with mandibular prognathism CT CERVICAL SPINE FINDINGS Alignment: Cervical stabilization collar in place. Slight reversal of the normal cervical lordosis centered at C5. Craniocervical and atlantoaxial alignment is maintained. Skull base and vertebrae: No acute fracture. No primary bone lesion or focal pathologic process. Extensive subcortical cystic changes noted in the dens. Soft tissues and spinal canal: No pre or paravertebral fluid or swelling. No visible canal hematoma. Disc levels: Multilevel intervertebral disc height loss with spondylitic endplate changes. Posterior disc osteophyte complexes are noted throughout the cervical spine  resulting in at most mild canal stenosis. Uncinate spurring and facet hypertrophic changes result in multilevel neural foraminal narrowing, severe on the left at C2-3, C3-4 and C4-5 and mild severe on the right at C4-5  and C7-T1. Additional mild-to-moderate foraminal stenoses are seen diffusely Upper chest: Biapical pleuroparenchymal scarring. Upper lungs are otherwise clear. Extensive vascular calcification in the cervical carotids. Other: None. IMPRESSION: 1. Hyperattenuation of the left MCA concerning for acute/subacute thrombus. 2. Geographic region of hypoattenuation within the left frontal lobe, caudate, internal capsule, and lentiform nucleus compatible with a left MCA distribution infarct. 3. No scalp swelling or calvarial fracture. 4. No acute cervical spine fracture. 5. Moderate to severe multilevel degenerative changes throughout the cervical spine, detailed above. These results were called by telephone at the time of interpretation on 07/01/2019 at 3:47 pm to Dr. Roslynn Amble , who verbally acknowledged these results. Electronically Signed   By: Lovena Le M.D.   On: 07/01/2019 15:53   Ct Cervical Spine Wo Contrast  Result Date: 07/01/2019 CLINICAL DATA:  Altered level of consciousness, found down, last seen normal 2 days prior EXAM: CT HEAD WITHOUT CONTRAST CT CERVICAL SPINE WITHOUT CONTRAST TECHNIQUE: Multidetector CT imaging of the head and cervical spine was performed following the standard protocol without intravenous contrast. Multiplanar CT image reconstructions of the cervical spine were also generated. COMPARISON:  CT head 05/17/2019 FINDINGS: CT HEAD FINDINGS Brain: Geographic region of hypoattenuation within the left frontal lobe, caudate, internal capsule and lentiform nucleus compatible with a left MCA distribution infarct. No evidence of hemorrhage, hydrocephalus, extra-axial collection or mass lesion/mass effect. Patchy areas of white matter hypoattenuation are most compatible with chronic microvascular angiopathy. Symmetric prominence of the ventricles, cisterns and sulci compatible with parenchymal volume loss. Vascular: Hyperattenuation of the left MCA concerning for acute/subacute thrombus.  Atherosclerotic calcification of the carotid siphons. Skull: No calvarial fracture or suspicious osseous lesion. No scalp swelling or hematoma. Sinuses/Orbits: Paranasal sinuses and mastoid air cells are predominantly clear. Orbital structures are unremarkable aside from prior lens extractions. Other: Edentulous with mandibular prognathism CT CERVICAL SPINE FINDINGS Alignment: Cervical stabilization collar in place. Slight reversal of the normal cervical lordosis centered at C5. Craniocervical and atlantoaxial alignment is maintained. Skull base and vertebrae: No acute fracture. No primary bone lesion or focal pathologic process. Extensive subcortical cystic changes noted in the dens. Soft tissues and spinal canal: No pre or paravertebral fluid or swelling. No visible canal hematoma. Disc levels: Multilevel intervertebral disc height loss with spondylitic endplate changes. Posterior disc osteophyte complexes are noted throughout the cervical spine resulting in at most mild canal stenosis. Uncinate spurring and facet hypertrophic changes result in multilevel neural foraminal narrowing, severe on the left at C2-3, C3-4 and C4-5 and mild severe on the right at C4-5 and C7-T1. Additional mild-to-moderate foraminal stenoses are seen diffusely Upper chest: Biapical pleuroparenchymal scarring. Upper lungs are otherwise clear. Extensive vascular calcification in the cervical carotids. Other: None. IMPRESSION: 1. Hyperattenuation of the left MCA concerning for acute/subacute thrombus. 2. Geographic region of hypoattenuation within the left frontal lobe, caudate, internal capsule, and lentiform nucleus compatible with a left MCA distribution infarct. 3. No scalp swelling or calvarial fracture. 4. No acute cervical spine fracture. 5. Moderate to severe multilevel degenerative changes throughout the cervical spine, detailed above. These results were called by telephone at the time of interpretation on 07/01/2019 at 3:47 pm to  Dr. Roslynn Amble , who verbally acknowledged these results. Electronically Signed   By: Elwin Sleight.D.  On: 07/01/2019 15:53   Mr Angio Head Wo Contrast  Addendum Date: 07/01/2019   ADDENDUM REPORT: 07/01/2019 20:38 ADDENDUM: These results were called by telephone at the time of interpretation on 07/01/2019 at 8:15 pm to Dr. Christia Reading OPYD, who verbally acknowledged these results. Electronically Signed   By: Ulyses Jarred M.D.   On: 07/01/2019 20:38   Result Date: 07/01/2019 CLINICAL DATA:  Found down at home.  History of atrial fibrillation. EXAM: MR HEAD WITHOUT CONTRAST MR CIRCLE OF WILLIS WITHOUT CONTRAST MRA OF THE NECK WITHOUT AND WITH CONTRAST TECHNIQUE: Multiplanar, multiecho pulse sequences of the brain, circle of willis and surrounding structures were obtained without intravenous contrast. Angiographic images of the neck were obtained using MRA technique without and with intravenous contrast. CONTRAST:  5 mL Gadavist COMPARISON:  Head CT same day FINDINGS: MRI HEAD FINDINGS BRAIN: The midline structures are normal. There is a large area of abnormal diffusion restriction within the left MCA territory. Early confluent hyperintense T2-weighted signal of the periventricular and deep white matter, most commonly due to chronic ischemic microangiopathy. Generalized atrophy without lobar predilection. Blood-sensitive sequences show no chronic microhemorrhage or superficial siderosis. SKULL AND UPPER CERVICAL SPINE: The visualized skull base, calvarium, upper cervical spine and extracranial soft tissues are normal. SINUSES/ORBITS: No fluid levels or advanced mucosal thickening. No mastoid or middle ear effusion. The orbits are normal. MRA HEAD FINDINGS POSTERIOR CIRCULATION: --Vertebral arteries: Normal V4 segments. --Posterior inferior cerebellar arteries (PICA): Patent origins from the vertebral arteries. --Anterior inferior cerebellar arteries (AICA): Normal on the right. Not clearly visualized on the left.  --Basilar artery: Normal. --Superior cerebellar arteries: Normal. --Posterior cerebral arteries: Normal. Both are predominantly supplied by the posterior communicating arteries (p-comm). ANTERIOR CIRCULATION: --Intracranial internal carotid arteries: Normal. --Anterior cerebral arteries (ACA): Normal. Both A1 segments are present. Patent anterior communicating artery (a-comm). --Middle cerebral arteries (MCA): There is complete occlusion of the left middle cerebral artery at the proximal M1 segment. Right MCA is normal. MRA NECK FINDINGS Aortic arch: Normal 3 vessel aortic branching pattern. The visualized subclavian arteries are normal. Right carotid system: Narrowing at the right carotid bifurcation results in approximately 50% stenosis of the proximal internal carotid artery. Left carotid system: Normal course and caliber without stenosis or evidence of dissection. Vertebral arteries: Right dominant. The left vertebral artery is occluded from its origin. There is reconstitution of the V4 segment secondary to collateral flow across the vertebral confluence and the PICA pathway. IMPRESSION: 1. Occlusion of the left middle cerebral artery at the origin of the M1 segment with complete left MCA territory infarct. 2. No hemorrhage or mass effect. 3. 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 No posterior circulation infarct, suggesting that this is chronic. 4. Approximately 50% stenosis of the proximal right internal carotid artery. Electronically Signed: By: Ulyses Jarred M.D. On: 07/01/2019 19:57   Mr Angiogram Neck W Or Wo Contrast  Addendum Date: 07/01/2019   ADDENDUM REPORT: 07/01/2019 20:38 ADDENDUM: These results were called by telephone at the time of interpretation on 07/01/2019 at 8:15 pm to Dr. Christia Reading OPYD, who verbally acknowledged these results. Electronically Signed   By: Ulyses Jarred M.D.   On: 07/01/2019 20:38   Result Date:  07/01/2019 CLINICAL DATA:  Found down at home.  History of atrial fibrillation. EXAM: MR HEAD WITHOUT CONTRAST MR CIRCLE OF WILLIS WITHOUT CONTRAST MRA OF THE NECK WITHOUT AND WITH CONTRAST TECHNIQUE: Multiplanar, multiecho pulse sequences of the  brain, circle of willis and surrounding structures were obtained without intravenous contrast. Angiographic images of the neck were obtained using MRA technique without and with intravenous contrast. CONTRAST:  5 mL Gadavist COMPARISON:  Head CT same day FINDINGS: MRI HEAD FINDINGS BRAIN: The midline structures are normal. There is a large area of abnormal diffusion restriction within the left MCA territory. Early confluent hyperintense T2-weighted signal of the periventricular and deep white matter, most commonly due to chronic ischemic microangiopathy. Generalized atrophy without lobar predilection. Blood-sensitive sequences show no chronic microhemorrhage or superficial siderosis. SKULL AND UPPER CERVICAL SPINE: The visualized skull base, calvarium, upper cervical spine and extracranial soft tissues are normal. SINUSES/ORBITS: No fluid levels or advanced mucosal thickening. No mastoid or middle ear effusion. The orbits are normal. MRA HEAD FINDINGS POSTERIOR CIRCULATION: --Vertebral arteries: Normal V4 segments. --Posterior inferior cerebellar arteries (PICA): Patent origins from the vertebral arteries. --Anterior inferior cerebellar arteries (AICA): Normal on the right. Not clearly visualized on the left. --Basilar artery: Normal. --Superior cerebellar arteries: Normal. --Posterior cerebral arteries: Normal. Both are predominantly supplied by the posterior communicating arteries (p-comm). ANTERIOR CIRCULATION: --Intracranial internal carotid arteries: Normal. --Anterior cerebral arteries (ACA): Normal. Both A1 segments are present. Patent anterior communicating artery (a-comm). --Middle cerebral arteries (MCA): There is complete occlusion of the left middle cerebral  artery at the proximal M1 segment. Right MCA is normal. MRA NECK FINDINGS Aortic arch: Normal 3 vessel aortic branching pattern. The visualized subclavian arteries are normal. Right carotid system: Narrowing at the right carotid bifurcation results in approximately 50% stenosis of the proximal internal carotid artery. Left carotid system: Normal course and caliber without stenosis or evidence of dissection. Vertebral arteries: Right dominant. The left vertebral artery is occluded from its origin. There is reconstitution of the V4 segment secondary to collateral flow across the vertebral confluence and the PICA pathway. IMPRESSION: 1. Occlusion of the left middle cerebral artery at the origin of the M1 segment with complete left MCA territory infarct. 2. No hemorrhage or mass effect. 3. 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 No posterior circulation infarct, suggesting that this is chronic. 4. Approximately 50% stenosis of the proximal right internal carotid artery. Electronically Signed: By: Ulyses Jarred M.D. On: 07/01/2019 19:57   Mr Brain Wo Contrast  Addendum Date: 07/01/2019   ADDENDUM REPORT: 07/01/2019 20:38 ADDENDUM: These results were called by telephone at the time of interpretation on 07/01/2019 at 8:15 pm to Dr. Christia Reading OPYD, who verbally acknowledged these results. Electronically Signed   By: Ulyses Jarred M.D.   On: 07/01/2019 20:38   Result Date: 07/01/2019 CLINICAL DATA:  Found down at home.  History of atrial fibrillation. EXAM: MR HEAD WITHOUT CONTRAST MR CIRCLE OF WILLIS WITHOUT CONTRAST MRA OF THE NECK WITHOUT AND WITH CONTRAST TECHNIQUE: Multiplanar, multiecho pulse sequences of the brain, circle of willis and surrounding structures were obtained without intravenous contrast. Angiographic images of the neck were obtained using MRA technique without and with intravenous contrast. CONTRAST:  5 mL Gadavist COMPARISON:   Head CT same day FINDINGS: MRI HEAD FINDINGS BRAIN: The midline structures are normal. There is a large area of abnormal diffusion restriction within the left MCA territory. Early confluent hyperintense T2-weighted signal of the periventricular and deep white matter, most commonly due to chronic ischemic microangiopathy. Generalized atrophy without lobar predilection. Blood-sensitive sequences show no chronic microhemorrhage or superficial siderosis. SKULL AND UPPER CERVICAL SPINE: The visualized skull base, calvarium, upper  cervical spine and extracranial soft tissues are normal. SINUSES/ORBITS: No fluid levels or advanced mucosal thickening. No mastoid or middle ear effusion. The orbits are normal. MRA HEAD FINDINGS POSTERIOR CIRCULATION: --Vertebral arteries: Normal V4 segments. --Posterior inferior cerebellar arteries (PICA): Patent origins from the vertebral arteries. --Anterior inferior cerebellar arteries (AICA): Normal on the right. Not clearly visualized on the left. --Basilar artery: Normal. --Superior cerebellar arteries: Normal. --Posterior cerebral arteries: Normal. Both are predominantly supplied by the posterior communicating arteries (p-comm). ANTERIOR CIRCULATION: --Intracranial internal carotid arteries: Normal. --Anterior cerebral arteries (ACA): Normal. Both A1 segments are present. Patent anterior communicating artery (a-comm). --Middle cerebral arteries (MCA): There is complete occlusion of the left middle cerebral artery at the proximal M1 segment. Right MCA is normal. MRA NECK FINDINGS Aortic arch: Normal 3 vessel aortic branching pattern. The visualized subclavian arteries are normal. Right carotid system: Narrowing at the right carotid bifurcation results in approximately 50% stenosis of the proximal internal carotid artery. Left carotid system: Normal course and caliber without stenosis or evidence of dissection. Vertebral arteries: Right dominant. The left vertebral artery is occluded  from its origin. There is reconstitution of the V4 segment secondary to collateral flow across the vertebral confluence and the PICA pathway. IMPRESSION: 1. Occlusion of the left middle cerebral artery at the origin of the M1 segment with complete left MCA territory infarct. 2. No hemorrhage or mass effect. 3. 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 No posterior circulation infarct, suggesting that this is chronic. 4. Approximately 50% stenosis of the proximal right internal carotid artery. Electronically Signed: By: Ulyses Jarred M.D. On: 07/01/2019 19:57   Dg Hip Unilat W Or Wo Pelvis 2-3 Views Right  Result Date: 07/01/2019 CLINICAL DATA:  Fall. EXAM: DG HIP (WITH OR WITHOUT PELVIS) 2-3V RIGHT COMPARISON:  Right hip x-rays dated May 17, 2019. FINDINGS: No acute fracture or dislocation. Unchanged end-stage right hip osteoarthritis with superolateral subluxation of the femoral head. Osteopenia. Soft tissues are unremarkable. IMPRESSION: 1.  No acute osseous abnormality. 2. Unchanged end-stage right hip osteoarthritis. Electronically Signed   By: Titus Dubin M.D.   On: 07/01/2019 13:42    PHYSICAL EXAM  Temp:  [98.3 F (36.8 C)-98.8 F (37.1 C)] 98.8 F (37.1 C) (08/27 1714) Pulse Rate:  [64-102] 73 (08/27 1714) Resp:  [0-20] 15 (08/27 1714) BP: (140-179)/(70-99) 153/73 (08/27 1714) SpO2:  [98 %-100 %] 100 % (08/27 1714)  General - Well nourished, well developed, eyes closed, not responding to commands.  Ophthalmologic - fundi not visualized due to noncooperation.  Cardiovascular - irregularly irregular heart rate and rhythm.  Neuro - eyes closed, not open on voice or pain, not following commands, nonverbal. With forced eye opening, eyes in left gaze position, blinking to visual threat, doll's eyes present but sluggish, not tracking, pupil bilaterally 2.5 mm, sluggish to light. Corneal reflex present, gag and  cough present.  Right nasolabial fold flattening.  Tongue protrusion not cooperative.  Left upper extremity flexed position, increased muscle tone, with purposeful movement, localizing to pain.  On pain stimulation, left lower extremity withdraw 2/5, right upper extremity 0/5, right lower extremity 1/5. DTR 1+ and positive babinski on the right. Sensation, coordination and gait not tested.   ASSESSMENT/PLAN Ms. Persephonie Hegwood is a 85 y.o. female with history of hypertension, mild mitral annular calcification and aortic root sclerosis/calcification, severe arthritis requiring wheelchair and walker to ambulate at baseline last known well 3 days prior  found down with R sided weakness and aphasia.   Stroke: Moderate to large R MCA infarct, embolic, secondary to known AF not on [facility]  CT head hypoattenuation L MCA ? Thrombus. ? L MCA infarct.  No fx.  MRI large L MCA infarct. No hemorrhage.  MRA head occlusion L MCA at origin.  MRA neck  Occlusion L VA at origin w/ reconstitution at L V4. R ICA 50% stenosis.  2D Echo EF 50 to 55%  LDL 122  HgbA1c 6.3  Lovenox 40 mg sq daily for VTE prophylaxis  aspirin 81 mg daily prior to admission, now on aspirin 300 mg suppository daily.   Therapy recommendations:  pending   Disposition:  pending (lived alone PTA)  Code status - DNR  Given poor prognosis, recommend palliative care consultation to discuss [place]  Atrial Fibrillation  Admitted 12/20/2018 and 05/17/2019 with AF w/ RVR  Home anticoagulation:   None d/t  unsteady gait/falls and advised age    On home Cardizem PTA  Currently on Cardizem IV due to n.p.o.  Rate controlled  Hypertensive Emergency  BP as high as 200/131 on admission  Improved now to the 140-160s . Permissive hypertension (OK if <180/105) but gradually normalize in 3-5 days . Long-term BP goal normotensive  Hyperlipidemia  Home meds:  No statin  Consider statin once able to swallow, depending on plan of  care  LDL 122, goal < 70  Continue statin at discharge  Dysphagia . Secondary to stroke . NPO . Speech on board   Other Stroke Risk Factors  Advanced age  Former Cigarette smoker, quit 36 yrs ago  Hx stroke/TIA in past per son. No documentation of it in Epic/Care everywhere  H/o diastolic dysfunction  Other Active Problems  osteoarthritis  Hospital day # 1  Unfortunate case, patient had large left MCA stroke with poor prognosis given likely severe neuro deficit of aphasia and right hemiplegia as well as mental and cognitive impairment.  Recommend palliative care on board for [place] discussion.  Neurology will sign off. Please call with questions. Thanks for the consult.  Rosalin Hawking, MD PhD Stroke Neurology 07/02/2019 6:22 PM   To contact Stroke Continuity provider, please refer to [email or web address] After hours, contact General Neurology

## 2019-07-02 NOTE — ED Notes (Signed)
Son [name] called and he was given update on his mother.

## 2019-07-02 NOTE — Progress Notes (Signed)
Patient arrived to unit 3w-21 at 1150am

## 2019-07-02 NOTE — Progress Notes (Signed)
Echocardiogram 2D Echocardiogram has been performed.  Shannon Cruz 07/02/2019, 10:53 AM

## 2019-07-03 DIAGNOSIS — R1310 Dysphagia, unspecified: Secondary | ICD-10-CM

## 2019-07-03 DIAGNOSIS — Z515 Encounter for palliative care: Secondary | ICD-10-CM

## 2019-07-03 DIAGNOSIS — Z66 Do not resuscitate: Secondary | ICD-10-CM

## 2019-07-03 LAB — URINALYSIS, ROUTINE W REFLEX MICROSCOPIC
Bilirubin Urine: NEGATIVE
Glucose, UA: NEGATIVE mg/dL
Ketones, ur: 5 mg/dL — AB
Nitrite: NEGATIVE
Protein, ur: >=300 mg/dL — AB
Specific Gravity, Urine: 1.026 (ref 1.005–1.030)
WBC, UA: >50 WBC/hpf — ABNORMAL HIGH (ref 0–5)
pH: 5 (ref 5.0–8.0)

## 2019-07-03 MED ORDER — METOPROLOL TARTRATE 5 MG/5ML IV SOLN: 2.5000 mg | Freq: Four times a day (QID) | INTRAVENOUS | Status: DC

## 2019-07-03 MED ORDER — METOPROLOL TARTRATE 5 MG/5ML IV SOLN
2.5000 mg | Freq: Four times a day (QID) | INTRAVENOUS | Status: DC
  Administered 2019-07-03 – 2019-07-04 (×4): 2.5 mg via INTRAVENOUS
  Filled 2019-07-03 (×4): qty 5

## 2019-07-03 MED ORDER — ORAL CARE MOUTH RINSE
15.0000 mL | Freq: Two times a day (BID) | OROMUCOSAL | Status: DC
  Administered 2019-07-04 – 2019-07-07 (×7): 15 mL via OROMUCOSAL

## 2019-07-03 MED ORDER — SODIUM CHLORIDE 0.9 % IV SOLN
1.0000 g | INTRAVENOUS | Status: DC
  Administered 2019-07-03: 17:00:00 1 g via INTRAVENOUS
  Filled 2019-07-03: qty 10

## 2019-07-03 NOTE — Consult Note (Signed)
Consultation Note Date: 07/03/2019   Patient Name: Shannon Cruz  DOB: 31-May-1925  MRN: [id number]  Age / Sex: 85 y.o., female  PCP: Dixie Dials, MD Referring Physician: Elmarie Shiley, MD  Reason for Consultation:  Establishing GOCs and emotional support  HPI/Patient Profile: 85 y.o. female   admitted on 07/01/2019 with  history of hypertension, mild mitral annular calcification and aortic root sclerosis/calcification, severe arthritis requiring wheelchair and walker to ambulate at baseline last known well 3 days prior found down with R sided weakness and aphasia.   According to son/Lonnie, she lives at home with some support from a live in son.  Head CT significant : Moderate to large RMCA infarct, embolic, secondary to known AF not on [facility]  Discussed with bedside RN; patient is non verbal, has failed swallow, unable to follow commands  Per neurology patient with a  large left MCA stroke with poor prognosis given likely severe neuro deficit of aphasia and right hemiplegia as well as mental and cognitive impairment.  They are recommending hospice care.  .   Family face treatment option decision, advanced directive decisions and anticipatory care needs.   Clinical Assessment and Goals of Care:  This NP Wadie Lessen reviewed medical records, received report from team, and then spoke by phone with son/Lonnie Belenda Cruise to discuss diagnosis, prognosis, GOC, EOL wishes disposition and options.  Concept of Hospice and Palliative Care were discussed  A detailed discussion was had today regarding advanced directives.  Concepts specific to code status, artifical feeding and hydration, continued IV antibiotics and rehospitalization was had.  The difference between a aggressive medical intervention path  and a palliative comfort care path for this patient at this time was had.  Values and goals of care  important to patient and family were attempted to be elicited.  Natural trajectory and expectations at EOL were discussed.   Questions and concerns addressed.   Family encouraged to call with questions or concerns.    PMT will continue to support holistically.  No documented HPOA.  There are four children.  Son/Waters [name] tells me he is the main Media planner (his mother's wish) and his siblings support this.   SUMMARY OF RECOMMENDATIONS    Code Status/Advance Care Planning:  DNR-previously documented  Family is open to temporary feeding tube/Cor-track if indicated  Continue to treat the treatable, they remain hopeful for improvement    Palliative Prophylaxis:   Aspiration, pain assessment, turn and position, mouth care  Additional Recommendations (Limitations, Scope, Preferences):  Full scope treatment "for now".  Son understands the likely poor prognosis but "needs a little more time" before making decisions for a shift to comfort, allowing for a natural death.  Psycho-social/Spiritual:   Desire for further Chaplaincy support: yes  Additional Recommendations: education on hospice benefit  Created space and opportunity for son to explore his thoughts and feeling regarding his mother' current medical situation and long term poor prognosis.  Prognosis:   Poor prognosis--will depend on desire for life prolonging measures  Discharge Planning:  to be determined--discussed options of residential hospice with son     Primary Diagnoses: Present on Admission: . CVA (cerebral vascular accident) ([place]) . HTN (hypertension) . Atrial fibrillation with RVR ([place]) . Osteoarthritis   I have reviewed the medical record, interviewed the patient and family, and examined the patient. The following aspects are pertinent.  Past Medical History:  Diagnosis Date  . Arthritis   . H/O diastolic dysfunction   . Heart palpitations   . Hypertension   . RBBB (right bundle branch  block) 11/20/2010   Echo - EF >55%; flow pattern suggestive of impaired LV relaxation, proximal septal thickening noted; mitral valve mildly thickened; mild mitral annular calcification; aortic root sclerosis/calcification   Social History   Socioeconomic History  . Marital status: Widowed    Spouse name: Not on file  . Number of children: Not on file  . Years of education: Not on file  . Highest education level: Not on file  Occupational History  . Not on file  Social Needs  . Financial resource strain: Not on file  . Food insecurity    Worry: Not on file    Inability: Not on file  . Transportation needs    Medical: Not on file    Non-medical: Not on file  Tobacco Use  . Smoking status: Former Smoker    Quit date: 11/05/1982    Years since quitting: 36.6  . Smokeless tobacco: Never Used  Substance and Sexual Activity  . Alcohol use: No  . Drug use: No  . Sexual activity: Never  Lifestyle  . Physical activity    Days per week: Not on file    Minutes per session: Not on file  . Stress: Not on file  Relationships  . Social Herbalist on phone: Not on file    Gets together: Not on file    Attends religious service: Not on file    Active member of club or organization: Not on file    Attends meetings of clubs or organizations: Not on file    Relationship status: Not on file  Other Topics Concern  . Not on file  Social History Narrative  . Not on file   No family history on file. Scheduled Meds: .  stroke: mapping our early stages of recovery book   Does not apply Once  . aspirin  300 mg Rectal Daily  . enoxaparin (LOVENOX) injection  30 mg Subcutaneous Q24H   Continuous Infusions: . sodium chloride 75 mL/hr at 07/03/19 0404  . diltiazem (CARDIZEM) infusion 10 mg/hr (07/03/19 0525)   PRN Meds:.acetaminophen **OR** acetaminophen (TYLENOL) oral liquid 160 mg/5 mL **OR** acetaminophen, diclofenac sodium, senna-docusate Medications Prior to Admission:  Prior  to Admission medications   Medication Sig Start Date End Date Taking? Authorizing Provider  acetaminophen (TYLENOL) 325 MG tablet Take 2 tablets (650 mg total) by mouth every 6 (six) hours as needed for mild pain (or Fever >/= 101). 12/23/18  Yes Hosie Poisson, MD  aspirin 81 MG chewable tablet Chew 81 mg by mouth daily.   Yes [provider]  bisacodyl (DULCOLAX) 10 MG suppository Place 1 suppository (10 mg total) rectally daily as needed for moderate constipation. 05/21/19  Yes Dixie Dials, MD  diclofenac sodium (VOLTAREN) 1 % GEL Apply 2 g topically 4 (four) times daily. 05/21/19  Yes Dixie Dials, MD  diltiazem (CARDIZEM) 30 MG tablet Take 1 tablet (30 mg total) by mouth every morning. Patient taking differently: Take 30-60  mg by mouth every morning.  05/21/19  Yes Dixie Dials, MD  furosemide (LASIX) 20 MG tablet Take 1 tablet (20 mg total) by mouth every Monday, Wednesday, and Friday. 05/22/19  Yes Dixie Dials, MD  hydrALAZINE (APRESOLINE) 25 MG tablet Take 1 tablet (25 mg total) by mouth 2 (two) times a day. 05/21/19  Yes Dixie Dials, MD  mirtazapine (REMERON) 7.5 MG tablet Take 7.5 mg by mouth at bedtime.    Yes [provider]  potassium chloride (K-DUR) 10 MEQ tablet Take 1 tablet (10 mEq total) by mouth every Monday, Wednesday, and Friday. 05/22/19  Yes Dixie Dials, MD  amiodarone (PACERONE) 200 MG tablet Take 1 tablet (200 mg total) by mouth daily. 05/21/19   Dixie Dials, MD  diltiazem (CARDIZEM) 60 MG tablet Take 60 mg by mouth 2 (two) times daily. Had follow-up Appt. With Dr to decide if qd or bid 06/19/19   [provider]  metoprolol tartrate (LOPRESSOR) 25 MG tablet Take 1 tablet (25 mg total) by mouth 2 (two) times daily. Patient not taking: Reported on 07/02/2019 06/26/17   Dixie Dials, MD   No Known Allergies Review of Systems  Unable to perform ROS: Acuity of condition    Physical Exam  Vital Signs: BP 135/77   Pulse (!) 26   Temp 98.9 F  (37.2 C) (Oral)   Resp (!) 22   Wt 55.7 kg   SpO2 99%   BMI 19.23 kg/m  Pain Scale: 0-10   Pain Score: 1    SpO2: SpO2: 99 % O2 Device:SpO2: 99 % O2 Flow Rate: .   IO: Intake/output summary:   Intake/Output Summary (Last 24 hours) at 07/03/2019 0715 Last data filed at 07/03/2019 0601 Gross per 24 hour  Intake 968 ml  Output 550 ml  Net 418 ml    LBM:   Baseline Weight: Weight: 55.7 kg Most recent weight: Weight: 55.7 kg     Palliative Assessment/Data:  20 % at best   Discussed with bedside RN and Dr Frederic Jericho  I will speak with son by phone tomorrow and if patient is still hospitalized plan to meet in person with son on Tuesday morning at 1000 am  Time In: 1045 Time Out: 1200 Time Total: 75 minutes Greater than 50%  of this time was spent counseling and coordinating care related to the above assessment and plan.  Signed by: Wadie Lessen, NP   Please contact Palliative Medicine Team phone at [phone] for questions and concerns.  For individual provider: See Shea Evans

## 2019-07-03 NOTE — Evaluation (Signed)
Clinical/Bedside Swallow Evaluation Patient Details  Name: Shannon Cruz MRN: [id number] Date of Birth: 1925/09/21  Today's Date: 07/03/2019 Time: SLP Start Time (ACUTE ONLY): 1054 SLP Stop Time (ACUTE ONLY): 1101 SLP Time Calculation (min) (ACUTE ONLY): 7 min  Past Medical History:  Past Medical History:  Diagnosis Date  . Arthritis   . H/O diastolic dysfunction   . Heart palpitations   . Hypertension   . RBBB (right bundle branch block) 11/20/2010   Echo - EF >55%; flow pattern suggestive of impaired LV relaxation, proximal septal thickening noted; mitral valve mildly thickened; mild mitral annular calcification; aortic root sclerosis/calcification   Past Surgical History: History reviewed. No pertinent surgical history. HPI:  Shannon Cruz is a 85 y.o. female with medical history significant of hypertension, atrial flutter with variable AV block, and degenerative joint disease who presents after being found down at home by a neighbor. Per chart patient lives at home alone and family comes on to check on her from time to time. MRI --Middle cerebral arteries;  There is complete occlusion of the   Assessment / Plan / Recommendation Clinical Impression  Pt lethargic, open mouth posture with lingual protrusion and low labial tone noted. Oral care provided to endentulous oral cavity. Trial of puree which remained on anterior tongue without manipulation and removed by therapist. No response to wet cloth to face and she swatted therapist's hand when deep pressure presented to trap. Continue NPO status and oral care. ST will follow for increased alertness, participation and safest po option.     SLP Visit Diagnosis: Dysphagia, unspecified (R13.10)    Aspiration Risk  Moderate aspiration risk    Diet Recommendation NPO   Medication Administration: Via alternative means    Other  Recommendations Oral Care Recommendations: Oral care QID   Follow up Recommendations Skilled Nursing  facility;24 hour supervision/assistance      Frequency and Duration min 2x/week  2 weeks       Prognosis Prognosis for Safe Diet Advancement: Fair Barriers to Reach Goals: Severity of deficits      Swallow Study   General HPI: Shannon Cruz is a 85 y.o. female with medical history significant of hypertension, atrial flutter with variable AV block, and degenerative joint disease who presents after being found down at home by a neighbor. Per chart patient lives at home alone and family comes on to check on her from time to time. MRI --Middle cerebral arteries;  There is complete occlusion of the Type of Study: Bedside Swallow Evaluation Previous Swallow Assessment: (none found) Diet Prior to this Study: NPO Temperature Spikes Noted: Yes Respiratory Status: Room air History of Recent Intubation: No Behavior/Cognition: Lethargic/Drowsy Oral Cavity Assessment: Other (comment)(lingual candidias) Oral Care Completed by SLP: Yes Oral Cavity - Dentition: Edentulous Vision: (TBA) Self-Feeding Abilities: Total assist Patient Positioning: Upright in bed Baseline Vocal Quality: Other (comment)(no vocalizations) Volitional Cough: Cognitively unable to elicit Volitional Swallow: Unable to elicit    Oral/Motor/Sensory Function Overall Oral Motor/Sensory Function: Generalized oral weakness Facial ROM: Other (Comment)(low labial tone)   Ice Chips Ice chips: Not tested   Thin Liquid Thin Liquid: Not tested    Nectar Thick Nectar Thick Liquid: Not tested   Honey Thick Honey Thick Liquid: Not tested   Puree Puree: Impaired Oral Phase Impairments: Poor awareness of bolus Oral Phase Functional Implications: Other (comment);Oral holding(sat on anterior tongue) Pharyngeal Phase Impairments: (no swallow elicited)   Solid     Solid: Not tested      Darliss Cheney  Willis 07/03/2019,11:22 AM   Orbie Pyo Colvin Caroli.Ed Risk analyst [phone] Office  [phone]

## 2019-07-03 NOTE — Progress Notes (Signed)
Pt HR steadily  Increase and fluctuates b/t 120 and 130 but unsustained. However at later part of the shift pt' frequently remained in 120.-133 Cardizem titrated x 2 and rate now at 10 mg/hr. MD on call notified no new orders given. Hr now stays between 108-120 s as pt is moving around more.. reposition and made comfortable in bed  RN will continue to monitor closely.will

## 2019-07-03 NOTE — Consult Note (Signed)
Ref: Dixie Dials, MD   Subjective:  Sleeping. Makes "Hum" noise on trying to call her or gentle tap on shoulder. Do not open eyes. Mouth appears asymmetrical with left sided weakness. Right upper extremity flaccid. Heart rate mildly tachycardic.  Objective:  Vital Signs in the last 24 hours: Temp:  [98.2 F (36.8 C)-99.3 F (37.4 C)] 99.3 F (37.4 C) (08/28 0737) Pulse Rate:  [26-140] 116 (08/28 0737) Cardiac Rhythm: Atrial fibrillation;Bundle branch block (08/28 0714) Resp:  [15-31] 25 (08/28 0737) BP: (123-169)/(71-111) 134/80 (08/28 0737) SpO2:  [98 %-100 %] 100 % (08/28 0737) Weight:  [55.7 kg] 55.7 kg (08/27 2012)  Physical Exam: BP Readings from Last 1 Encounters:  07/03/19 134/80     Wt Readings from Last 1 Encounters:  07/02/19 55.7 kg    Weight change:  Body mass index is 19.23 kg/m. HEENT: [place]/AT, Eyes-Brown, Conjunctiva-Pink, Sclera-Non-icteric Neck: No JVD, No bruit, Trachea midline. Lungs:  Clear, Bilateral. Cardiac:  Irregular rhythm, normal S1 and S2, no S3. II/VI systolic murmur. Abdomen:  Soft, non-tender. BS present. Extremities:  No edema present. No cyanosis. No clubbing. CNS: AxOx0, Moves left side randomly.  Skin: Warm and dry.   Intake/Output from previous day: 08/27 0701 - 08/28 0700 In: 968 [I.V.:968] Out: 550 [Urine:550]    Lab Results: BMET    Component Value Date/Time   NA 139 07/02/2019 0425   NA 138 07/01/2019 1435   NA 130 (L) 05/21/2019 0423   K 3.9 07/02/2019 0425   K 3.5 07/01/2019 1435   K 4.0 05/21/2019 0423   CL 105 07/02/2019 0425   CL 103 07/01/2019 1435   CL 97 (L) 05/21/2019 0423   CO2 22 07/02/2019 0425   CO2 21 (L) 07/01/2019 1435   CO2 22 05/21/2019 0423   GLUCOSE 111 (H) 07/02/2019 0425   GLUCOSE 115 (H) 07/01/2019 1435   GLUCOSE 103 (H) 05/21/2019 0423   BUN 22 07/02/2019 0425   BUN 20 07/01/2019 1435   BUN 27 (H) 05/21/2019 0423   CREATININE 1.24 (H) 07/02/2019 0425   CREATININE 1.39 (H) 07/01/2019  1435   CREATININE 1.36 (H) 05/21/2019 0423   CALCIUM 9.4 07/02/2019 0425   CALCIUM 9.5 07/01/2019 1435   CALCIUM 8.9 05/21/2019 0423   GFRNONAA 38 (L) 07/02/2019 0425   GFRNONAA 33 (L) 07/01/2019 1435   GFRNONAA 34 (L) 05/21/2019 0423   GFRAA 44 (L) 07/02/2019 0425   GFRAA 38 (L) 07/01/2019 1435   GFRAA 39 (L) 05/21/2019 0423   CBC    Component Value Date/Time   WBC 13.4 (H) 07/02/2019 0425   RBC 5.33 (H) 07/02/2019 0425   HGB 13.8 07/02/2019 0425   HCT 43.6 07/02/2019 0425   PLT 378 07/02/2019 0425   MCV 81.8 07/02/2019 0425   MCH 25.9 (L) 07/02/2019 0425   MCHC 31.7 07/02/2019 0425   RDW 17.9 (H) 07/02/2019 0425   LYMPHSABS 0.6 (L) 07/02/2019 0425   MONOABS 1.4 (H) 07/02/2019 0425   EOSABS 0.0 07/02/2019 0425   BASOSABS 0.1 07/02/2019 0425   HEPATIC Function Panel Recent Labs    05/17/19 1641 07/01/19 1435 07/02/19 0425  PROT 7.4 7.4 7.2   HEMOGLOBIN A1C No components found for: HGA1C,  MPG CARDIAC ENZYMES Lab Results  Component Value Date   MLYYTKP 546 (H) 07/01/2019   TROPONINI <0.03 12/21/2018   TROPONINI <0.03 12/21/2018   TROPONINI <0.03 12/20/2018   BNP No results for input(s): PROBNP in the last 8760 hours. TSH Recent Labs  12/21/18 0252  TSH 4.241   CHOLESTEROL Recent Labs    07/02/19 0425  CHOL 187    Scheduled Meds: .  stroke: mapping our early stages of recovery book   Does not apply Once  . aspirin  300 mg Rectal Daily  . enoxaparin (LOVENOX) injection  30 mg Subcutaneous Q24H   Continuous Infusions: . sodium chloride 75 mL/hr at 07/03/19 0404  . diltiazem (CARDIZEM) infusion 10 mg/hr (07/03/19 0525)   PRN Meds:.acetaminophen **OR** acetaminophen (TYLENOL) oral liquid 160 mg/5 mL **OR** acetaminophen, diclofenac sodium, senna-docusate  Assessment/Plan: Acute left MCA stroke with hemiplegia Chronic permanent atrial fibrillation  Mobile atheroma in ascending aorta near root Moderate MR and TR Right hip and knee severer  osteoarthritis Severe multi-level cervical disc disease Hypertension  Start IV metoprolol for heart rate control until PO/Tube is possible.   LOS: 2 days   Time spent including chart review, lab review, examination, discussion with Patient's son : 57  min   Dixie Dials  MD  07/03/2019, 10:27 AM

## 2019-07-03 NOTE — Progress Notes (Signed)
OT Cancellation Note  Patient Details Name: Shannon Cruz MRN: [id number] DOB: [birth year]   Cancelled Treatment:    Reason Eval/Treat Not Completed: Other (comment)(Pt having palliative consult. OT to follow-up for eval)  Pt with large L MCA CVA. OT to continue to follow as appropriate.  Ebony Hail Harold Hedge) Marsa Aris OTR/L Acute Rehabilitation Services Pager: [phone] Office: [place] 07/03/2019, 7:14 AM

## 2019-07-03 NOTE — Evaluation (Signed)
Occupational Therapy Evaluation Patient Details Name: Shannon Cruz MRN: [id number] DOB: 06/15/1925 Today's Date: 07/03/2019    History of Present Illness Pt is a 85 y/o female admitted after being found on the floor at home. MRI revealed L MCA infarct. PMH includes HTN and a fib.    Clinical Impression   Pt PTA: pt living independently alone. Pt lethargic throughout session. Did not open eyes. Pt not following verbal commands. Pt turning head to localize therapist's voice and turned head away when OT washing pt's face. Bed mobility for rolling to L side was totalA with no active motion noted. Pt would benefit from continued OT skilled services for ADL, mobility and safety in SNF setting. OT following acutely.    Follow Up Recommendations  SNF;Supervision/Assistance - 24 hour    Equipment Recommendations  None recommended by OT    Recommendations for Other Services       Precautions / Restrictions Precautions Precautions: Fall Restrictions Weight Bearing Restrictions: No      Mobility Bed Mobility Overal bed mobility: Needs Assistance Bed Mobility: Rolling Rolling: Total assist         General bed mobility comments: Pt totalA rolling to L side; no active movement noted.  Transfers                 General transfer comment: unable to test    Balance                                           ADL either performed or assessed with clinical judgement   ADL Overall ADL's : Needs assistance/impaired Eating/Feeding: NPO   Grooming: Total assistance   Upper Body Bathing: Total assistance   Lower Body Bathing: Total assistance   Upper Body Dressing : Total assistance   Lower Body Dressing: Total assistance   Toilet Transfer: Total assistance   Toileting- Clothing Manipulation and Hygiene: Total assistance       Functional mobility during ADLs: Total assistance;+2 for physical assistance;+2 for safety/equipment General ADL Comments:  No active motion other than head turning. Eyes attempting to open, but close immediately.     Vision Patient Visual Report: Other (comment)(unable to test; unknown) Vision Assessment?: Vision impaired- to be further tested in functional context Additional Comments: unable to test     Perception     Praxis      Pertinent Vitals/Pain Pain Assessment: Faces Faces Pain Scale: No hurt     Hand Dominance     Extremity/Trunk Assessment Upper Extremity Assessment Upper Extremity Assessment: Generalized weakness;RUE deficits/detail;LUE deficits/detail RUE Deficits / Details: Flaccid throughout. Able to move through full PROM. Edema noted.  LUE Deficits / Details: Pt PROM, WFL; no active motion noted.   Lower Extremity Assessment Lower Extremity Assessment: Defer to PT evaluation       Communication Communication Communication: Expressive difficulties   Cognition Arousal/Alertness: Lethargic   Overall Cognitive Status: No family/caregiver present to determine baseline cognitive functioning                                 General Comments: Pt lethargic throughout session. Did not open eyes. Would occasionally mumble. Pt not following verbal commands. Pt turning head to localize therapist's voice and turned head away when OT washing pt's face.   General Comments  Exercises     Shoulder Instructions      Home Living Family/patient expects to be discharged to:: Unsure                                 Additional Comments: Pt not following commands and unable to arouse easily.       Prior Functioning/Environment          Comments: Unsure as pt not responding to questions.         OT Problem List: Decreased strength;Decreased activity tolerance;Impaired balance (sitting and/or standing);Decreased coordination;Decreased cognition;Impaired UE functional use      OT Treatment/Interventions: Self-care/ADL training;Neuromuscular  education;Energy conservation;Therapeutic activities;Patient/family education;Balance training    OT Goals(Current goals can be found in the care plan section) Acute Rehab OT Goals Patient Stated Goal: unable to state OT Goal Formulation: Patient unable to participate in goal setting Time For Goal Achievement: 07/17/19 Potential to Achieve Goals: Fair ADL Goals Additional ADL Goal #1: Pt will increase to 5 mins of awaken arousal to increase commans follow to 50%. Additional ADL Goal #2: pt will initiate bed mobility with maxA.  OT Frequency: Min 2X/week   Barriers to D/C:            Co-evaluation              AM-PAC OT "6 Clicks" Daily Activity     Outcome Measure Help from another person eating meals?: Total Help from another person taking care of personal grooming?: Total Help from another person toileting, which includes using toliet, bedpan, or urinal?: Total Help from another person bathing (including washing, rinsing, drying)?: Total Help from another person to put on and taking off regular upper body clothing?: Total Help from another person to put on and taking off regular lower body clothing?: Total 6 Click Score: 6   End of Session Nurse Communication: Mobility status  Activity Tolerance: Patient tolerated treatment well Patient left: in bed;with call bell/phone within reach;with bed alarm set  OT Visit Diagnosis: Muscle weakness (generalized) (M62.81);Hemiplegia and hemiparesis Hemiplegia - Right/Left: Right Hemiplegia - dominant/non-dominant: Dominant Hemiplegia - caused by: Cerebral infarction                Time: 1202-1217 OT Time Calculation (min): 15 min Charges:  OT General Charges $OT Visit: 1 Visit OT Evaluation $OT Eval Low Complexity: 1 Low  Darryl Nestle) Marsa Aris OTR/L Acute Rehabilitation Services Pager: [phone] Office: [place] 07/03/2019, 1:13 PM

## 2019-07-03 NOTE — Progress Notes (Addendum)
PROGRESS NOTE    Shayanna Thatch  NAT:[id number] DOB: 1925/01/08 DOA: 07/01/2019 PCP: Dixie Dials, MD   Brief Narrative: 85 year old with past medical history significant for hypertension, a flutter with variable AV block who presents after being found down at home by a neighbor.  At baseline patient lives at home alone and family comes in to check on her from time to time.  Patient was last noted to be in normal state of health on Sunday.  History is limited due to patient altered mental status and acute condition.  Patient was just recently hospitalized last month by Dr. Doylene Canard for permanent A. fib.  She was on aspirin and medication for rate control. Evaluation in the ED patient was found to have a large left MCA acute stroke.     Assessment & Plan:   Principal Problem:   CVA (cerebral vascular accident) ([place]) Active Problems:   HTN (hypertension)   Atrial fibrillation with RVR ([place])   Osteoarthritis   Palliative care by specialist   DNR (do not resuscitate)   Dysphagia   1-Acute large left MCA stroke MRI positive for acute left MCA stroke. Neurology consulted and following. Echo Ef 55 % , mobile atheroma.  On aspirin per rectum. Fail speech. Continue with IV fluids for 24 hours.  Patient is lethargic on my evaluation.  Discussed CODE STATUS with patient's son who is POA.  He agreed with making his mother DNR.  Palliative care consult for further goals of care. Family needs time to process information.  They might be open to transient tube feeding. Will continue with IV fluids for now and reassess tube feeding in 24 hours.  Ad[place]um;  I spoke with son Eugenio this afternoon, regarding his  mother condition and that she wont be able probably to walk again and or speak, I explained to him trajectory of a stroke in a bedbound patient and risk for recurrent infection and blood clots, poor quality of life.  I explained to him that I do not think that enteral nutrition for a few  days is going to make a lot of difference.  He said that his mother would not want to have a PEG tube and artificial nutrition in a case like this. -He is considering residential hospice.  Will consult social worker for residential hospice referral.  He understand that at the residential hospice facility  Focus on comfort care, no fluids, no antibiotics.  -We will continue fluids overnight and antibiotics overnight. -He is considering moving forward to a more comfort care approach. -He wants to speak with palliative care again tomorrow.  2-A. Fib On Cardizem drip Was not on anticoagulation prior to admission.  Was only on aspirin.  Hypertension; Permissive hypertension  Chronic kidney disease a stage III IV fluids due to n.p.o. status  Leukocytosis;  Check UA, urine culture.  Cover with ceftriaxone for aspiration PNA  Estimated body mass index is 19.23 kg/m as calculated from the following:   Height as of 05/17/19: 5\' 7"  (1.702 m).   Weight as of this encounter: 55.7 kg.   DVT prophylaxis: Lovenox Code Status: DNR, discussed with patient's son Family Communication: Son over the phone.  Disposition Plan: Remain in the hospital for further treatment of acute stroke Consultants:   Neurology  Palliative  care consult  Procedures:   ECHO  Antimicrobials:    Subjective: Lethargic , non responsive.    Objective: Vitals:   07/03/19 4270 07/03/19 0656 07/03/19 0737 07/03/19 1240  BP: 135/77  134/80 128/80  Pulse: (!) 140 (!) 26 (!) 116 (!) 102  Resp: (!) 22  (!) 25 (!) 27  Temp:   99.3 F (37.4 C) 98.3 F (36.8 C)  TempSrc:   Oral Axillary  SpO2:   100% 99%  Weight:        Intake/Output Summary (Last 24 hours) at 07/03/2019 1554 Last data filed at 07/03/2019 0601 Gross per 24 hour  Intake 968 ml  Output 550 ml  Net 418 ml   Filed Weights   07/02/19 2012  Weight: 55.7 kg    Examination:  General exam: Lethargic Respiratory system: BL air movement.    Cardiovascular system: S 1, S 2 RRR Gastrointestinal system: BS present, soft, nt Central nervous system: lethargic right side weakness.  Extremities: trace edema Skin: No rashes, lesions or ulcers    Data Reviewed: I have personally reviewed following labs and imaging studies  CBC: Recent Labs  Lab 07/01/19 1435 07/02/19 0425  WBC 12.3* 13.4*  NEUTROABS 10.9* 11.2*  HGB 14.3 13.8  HCT 46.2* 43.6  MCV 83.4 81.8  PLT 368 989   Basic Metabolic Panel: Recent Labs  Lab 07/01/19 1435 07/02/19 0425  NA 138 139  K 3.5 3.9  CL 103 105  CO2 21* 22  GLUCOSE 115* 111*  BUN 20 22  CREATININE 1.39* 1.24*  CALCIUM 9.5 9.4   GFR: Estimated Creatinine Clearance: 26 mL/min (A) (by C-G formula based on SCr of 1.24 mg/dL (H)). Liver Function Tests: Recent Labs  Lab 07/01/19 1435 07/02/19 0425  AST 46* 38  ALT 15 14  ALKPHOS 206* 172*  BILITOT 1.4* 1.5*  PROT 7.4 7.2  ALBUMIN 3.3* 3.0*   No results for input(s): LIPASE, AMYLASE in the last 168 hours. No results for input(s): AMMONIA in the last 168 hours. Coagulation Profile: Recent Labs  Lab 07/01/19 1435  INR 1.1   Cardiac Enzymes: Recent Labs  Lab 07/01/19 1435  CKTOTAL 627*   BNP (last 3 results) No results for input(s): PROBNP in the last 8760 hours. HbA1C: Recent Labs    07/02/19 0425  HGBA1C 6.3*   CBG: Recent Labs  Lab 07/02/19 1043 07/02/19 1232  GLUCAP 97 104*   Lipid Profile: Recent Labs    07/02/19 0425  CHOL 187  HDL 50  LDLCALC 122*  TRIG 73  CHOLHDL 3.7   Thyroid Function Tests: No results for input(s): TSH, T4TOTAL, FREET4, T3FREE, THYROIDAB in the last 72 hours. Anemia Panel: No results for input(s): VITAMINB12, FOLATE, FERRITIN, TIBC, IRON, RETICCTPCT in the last 72 hours. Sepsis Labs: No results for input(s): PROCALCITON, LATICACIDVEN in the last 168 hours.  Recent Results (from the past 240 hour(s))  SARS CORONAVIRUS 2 (TAT 6-12 HRS) Nasal Swab Aptima Multi Swab      Status: None   Collection Time: 07/01/19  4:13 PM   Specimen: Aptima Multi Swab; Nasal Swab  Result Value Ref Range Status   SARS Coronavirus 2 NEGATIVE NEGATIVE Final    Comment: (NOTE) SARS-CoV-2 target nucleic acids are NOT DETECTED. The SARS-CoV-2 RNA is generally detectable in upper and lower respiratory specimens during the acute phase of infection. Negative results do not preclude SARS-CoV-2 infection, do not rule out co-infections with other pathogens, and should not be used as the sole basis for treatment or other patient management decisions. Negative results must be combined with clinical observations, patient history, and epidemiological information. The expected result is Negative. Fact Sheet for Patients: SugarRoll.be Fact Sheet for Healthcare Providers: https://www.woods-mathews.com/  This test is not yet approved or cleared by the [place] and  has been authorized for detection and/or diagnosis of SARS-CoV-2 by FDA under an Emergency Use Authorization (EUA). This EUA will remain  in effect (meaning this test can be used) for the duration of the COVID-19 declaration under Section 56 4(b)(1) of the Act, 21 U.S.C. section 360bbb-3(b)(1), unless the authorization is terminated or revoked sooner. Performed at [facility], [place] [address], [place], [place] [address]          Radiology Studies: Mr Angio Head Wo Contrast  Addendum Date: 07/01/2019   ADDENDUM REPORT: 07/01/2019 20:38 ADDENDUM: These results were called by telephone at the time of interpretation on 07/01/2019 at 8:15 pm to Dr. Mitzi Hansen, who verbally acknowledged these results. Electronically Signed   By: Ulyses Jarred M.D.   On: 07/01/2019 20:38   Result Date: 07/01/2019 CLINICAL DATA:  Found down at home.  History of atrial fibrillation. EXAM: MR HEAD WITHOUT CONTRAST MR CIRCLE OF WILLIS WITHOUT CONTRAST MRA OF THE NECK WITHOUT AND WITH CONTRAST  TECHNIQUE: Multiplanar, multiecho pulse sequences of the brain, circle of willis and surrounding structures were obtained without intravenous contrast. Angiographic images of the neck were obtained using MRA technique without and with intravenous contrast. CONTRAST:  5 mL Gadavist COMPARISON:  Head CT same day FINDINGS: MRI HEAD FINDINGS BRAIN: The midline structures are normal. There is a large area of abnormal diffusion restriction within the left MCA territory. Early confluent hyperintense T2-weighted signal of the periventricular and deep white matter, most commonly due to chronic ischemic microangiopathy. Generalized atrophy without lobar predilection. Blood-sensitive sequences show no chronic microhemorrhage or superficial siderosis. SKULL AND UPPER CERVICAL SPINE: The visualized skull base, calvarium, upper cervical spine and extracranial soft tissues are normal. SINUSES/ORBITS: No fluid levels or advanced mucosal thickening. No mastoid or middle ear effusion. The orbits are normal. MRA HEAD FINDINGS POSTERIOR CIRCULATION: --Vertebral arteries: Normal V4 segments. --Posterior inferior cerebellar arteries (PICA): Patent origins from the vertebral arteries. --Anterior inferior cerebellar arteries (AICA): Normal on the right. Not clearly visualized on the left. --Basilar artery: Normal. --Superior cerebellar arteries: Normal. --Posterior cerebral arteries: Normal. Both are predominantly supplied by the posterior communicating arteries (p-comm). ANTERIOR CIRCULATION: --Intracranial internal carotid arteries: Normal. --Anterior cerebral arteries (ACA): Normal. Both A1 segments are present. Patent anterior communicating artery (a-comm). --Middle cerebral arteries (MCA): There is complete occlusion of the left middle cerebral artery at the proximal M1 segment. Right MCA is normal. MRA NECK FINDINGS Aortic arch: Normal 3 vessel aortic branching pattern. The visualized subclavian arteries are normal. Right carotid  system: Narrowing at the right carotid bifurcation results in approximately 50% stenosis of the proximal internal carotid artery. Left carotid system: Normal course and caliber without stenosis or evidence of dissection. Vertebral arteries: Right dominant. The left vertebral artery is occluded from its origin. There is reconstitution of the V4 segment secondary to collateral flow across the vertebral confluence and the PICA pathway. IMPRESSION: 1. Occlusion of the left middle cerebral artery at the origin of the M1 segment with complete left MCA territory infarct. 2. No hemorrhage or mass effect. 3. 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 No posterior circulation infarct, suggesting that this is chronic. 4. Approximately 50% stenosis of the proximal right internal carotid artery. Electronically Signed: By: Ulyses Jarred M.D. On: 07/01/2019 19:57   Mr Angiogram Neck W Or Wo Contrast  Addendum Date:  07/01/2019   ADDENDUM REPORT: 07/01/2019 20:38 ADDENDUM: These results were called by telephone at the time of interpretation on 07/01/2019 at 8:15 pm to Dr. Christia Reading OPYD, who verbally acknowledged these results. Electronically Signed   By: Ulyses Jarred M.D.   On: 07/01/2019 20:38   Result Date: 07/01/2019 CLINICAL DATA:  Found down at home.  History of atrial fibrillation. EXAM: MR HEAD WITHOUT CONTRAST MR CIRCLE OF WILLIS WITHOUT CONTRAST MRA OF THE NECK WITHOUT AND WITH CONTRAST TECHNIQUE: Multiplanar, multiecho pulse sequences of the brain, circle of willis and surrounding structures were obtained without intravenous contrast. Angiographic images of the neck were obtained using MRA technique without and with intravenous contrast. CONTRAST:  5 mL Gadavist COMPARISON:  Head CT same day FINDINGS: MRI HEAD FINDINGS BRAIN: The midline structures are normal. There is a large area of abnormal diffusion restriction within the left MCA territory.  Early confluent hyperintense T2-weighted signal of the periventricular and deep white matter, most commonly due to chronic ischemic microangiopathy. Generalized atrophy without lobar predilection. Blood-sensitive sequences show no chronic microhemorrhage or superficial siderosis. SKULL AND UPPER CERVICAL SPINE: The visualized skull base, calvarium, upper cervical spine and extracranial soft tissues are normal. SINUSES/ORBITS: No fluid levels or advanced mucosal thickening. No mastoid or middle ear effusion. The orbits are normal. MRA HEAD FINDINGS POSTERIOR CIRCULATION: --Vertebral arteries: Normal V4 segments. --Posterior inferior cerebellar arteries (PICA): Patent origins from the vertebral arteries. --Anterior inferior cerebellar arteries (AICA): Normal on the right. Not clearly visualized on the left. --Basilar artery: Normal. --Superior cerebellar arteries: Normal. --Posterior cerebral arteries: Normal. Both are predominantly supplied by the posterior communicating arteries (p-comm). ANTERIOR CIRCULATION: --Intracranial internal carotid arteries: Normal. --Anterior cerebral arteries (ACA): Normal. Both A1 segments are present. Patent anterior communicating artery (a-comm). --Middle cerebral arteries (MCA): There is complete occlusion of the left middle cerebral artery at the proximal M1 segment. Right MCA is normal. MRA NECK FINDINGS Aortic arch: Normal 3 vessel aortic branching pattern. The visualized subclavian arteries are normal. Right carotid system: Narrowing at the right carotid bifurcation results in approximately 50% stenosis of the proximal internal carotid artery. Left carotid system: Normal course and caliber without stenosis or evidence of dissection. Vertebral arteries: Right dominant. The left vertebral artery is occluded from its origin. There is reconstitution of the V4 segment secondary to collateral flow across the vertebral confluence and the PICA pathway. IMPRESSION: 1. Occlusion of the  left middle cerebral artery at the origin of the M1 segment with complete left MCA territory infarct. 2. No hemorrhage or mass effect. 3. 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 No posterior circulation infarct, suggesting that this is chronic. 4. Approximately 50% stenosis of the proximal right internal carotid artery. Electronically Signed: By: Ulyses Jarred M.D. On: 07/01/2019 19:57   Mr Brain Wo Contrast  Addendum Date: 07/01/2019   ADDENDUM REPORT: 07/01/2019 20:38 ADDENDUM: These results were called by telephone at the time of interpretation on 07/01/2019 at 8:15 pm to Dr. Christia Reading OPYD, who verbally acknowledged these results. Electronically Signed   By: Ulyses Jarred M.D.   On: 07/01/2019 20:38   Result Date: 07/01/2019 CLINICAL DATA:  Found down at home.  History of atrial fibrillation. EXAM: MR HEAD WITHOUT CONTRAST MR CIRCLE OF WILLIS WITHOUT CONTRAST MRA OF THE NECK WITHOUT AND WITH CONTRAST TECHNIQUE: Multiplanar, multiecho pulse sequences of the brain, circle of willis and surrounding structures were obtained without intravenous contrast. Angiographic images of the  neck were obtained using MRA technique without and with intravenous contrast. CONTRAST:  5 mL Gadavist COMPARISON:  Head CT same day FINDINGS: MRI HEAD FINDINGS BRAIN: The midline structures are normal. There is a large area of abnormal diffusion restriction within the left MCA territory. Early confluent hyperintense T2-weighted signal of the periventricular and deep white matter, most commonly due to chronic ischemic microangiopathy. Generalized atrophy without lobar predilection. Blood-sensitive sequences show no chronic microhemorrhage or superficial siderosis. SKULL AND UPPER CERVICAL SPINE: The visualized skull base, calvarium, upper cervical spine and extracranial soft tissues are normal. SINUSES/ORBITS: No fluid levels or advanced mucosal thickening. No  mastoid or middle ear effusion. The orbits are normal. MRA HEAD FINDINGS POSTERIOR CIRCULATION: --Vertebral arteries: Normal V4 segments. --Posterior inferior cerebellar arteries (PICA): Patent origins from the vertebral arteries. --Anterior inferior cerebellar arteries (AICA): Normal on the right. Not clearly visualized on the left. --Basilar artery: Normal. --Superior cerebellar arteries: Normal. --Posterior cerebral arteries: Normal. Both are predominantly supplied by the posterior communicating arteries (p-comm). ANTERIOR CIRCULATION: --Intracranial internal carotid arteries: Normal. --Anterior cerebral arteries (ACA): Normal. Both A1 segments are present. Patent anterior communicating artery (a-comm). --Middle cerebral arteries (MCA): There is complete occlusion of the left middle cerebral artery at the proximal M1 segment. Right MCA is normal. MRA NECK FINDINGS Aortic arch: Normal 3 vessel aortic branching pattern. The visualized subclavian arteries are normal. Right carotid system: Narrowing at the right carotid bifurcation results in approximately 50% stenosis of the proximal internal carotid artery. Left carotid system: Normal course and caliber without stenosis or evidence of dissection. Vertebral arteries: Right dominant. The left vertebral artery is occluded from its origin. There is reconstitution of the V4 segment secondary to collateral flow across the vertebral confluence and the PICA pathway. IMPRESSION: 1. Occlusion of the left middle cerebral artery at the origin of the M1 segment with complete left MCA territory infarct. 2. No hemorrhage or mass effect. 3. 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 No posterior circulation infarct, suggesting that this is chronic. 4. Approximately 50% stenosis of the proximal right internal carotid artery. Electronically Signed: By: Ulyses Jarred M.D. On: 07/01/2019 19:57         Scheduled Meds:   stroke: mapping our early stages of recovery book   Does not apply Once   aspirin  300 mg Rectal Daily   enoxaparin (LOVENOX) injection  30 mg Subcutaneous Q24H   metoprolol tartrate  2.5 mg Intravenous Q6H   Continuous Infusions:  sodium chloride 75 mL/hr at 07/03/19 1247   diltiazem (CARDIZEM) infusion 10 mg/hr (07/03/19 1133)     LOS: 2 days    Time spent: 35 minutes    Elmarie Shiley, MD [facility] Pager [phone]  If 7PM-7AM, please contact night-coverage [email or web address] Password [facility] 07/03/2019, 3:54 PM

## 2019-07-03 NOTE — Care Management Important Message (Signed)
Important Message  Patient Details  Name: Shannon Cruz MRN: [id number] Date of Birth: 1925-04-06   Medicare Important Message Given:  Yes  Due to illness patient not able to sign.  I signed on patient behalf and left a copy at the patient bedside.    Lacye Mccarn 07/03/2019, 2:01 PM

## 2019-07-04 DIAGNOSIS — R627 Adult failure to thrive: Secondary | ICD-10-CM

## 2019-07-04 DIAGNOSIS — R4189 Other symptoms and signs involving cognitive functions and awareness: Secondary | ICD-10-CM

## 2019-07-04 MED ORDER — LORAZEPAM 1 MG PO TABS: 1.0000 mg | ORAL_TABLET | ORAL | Status: DC | PRN

## 2019-07-04 MED ORDER — MORPHINE SULFATE (CONCENTRATE) 10 MG/0.5ML PO SOLN: 5.0000 mg | ORAL | Status: DC | PRN

## 2019-07-04 NOTE — Progress Notes (Signed)
PROGRESS NOTE    Shannon Cruz  SWH:[id number] DOB: 20-Jan-1925 DOA: 07/01/2019 PCP: Dixie Dials, MD   Brief Narrative: 85 year old with past medical history significant for hypertension, a flutter with variable AV block who presents after being found down at home by a neighbor.  At baseline patient lives at home alone and family comes in to check on her from time to time.  Patient was last noted to be in normal state of health on Sunday.  History is limited due to patient altered mental status and acute condition.  Patient was just recently hospitalized last month by Dr. Doylene Canard for permanent A. fib.  She was on aspirin and medication for rate control. Evaluation in the ED patient was found to have a large left MCA acute stroke.     Assessment & Plan:   Principal Problem:   CVA (cerebral vascular accident) ([place]) Active Problems:   HTN (hypertension)   Atrial fibrillation with RVR ([place])   Osteoarthritis   Palliative care by specialist   DNR (do not resuscitate)   Dysphagia   1-Acute large left MCA stroke -MRI positive for acute left MCA stroke. -Neurology consulted and following. -Echo Ef 55 % , mobile atheroma.  -On aspirin per rectum. -Fail speech. Continue with IV fluids for 24 hours.  -Patient is lethargic on my evaluation.  Discussed CODE STATUS with patient's son who is POA.  He agreed with making his mother DNR.  -Palliative care following.  -I spoke with son Greenstein the  Afternoon of 8/28, regarding his mother condition and that she wont be able probably to walk again and or speak, I explained to him trajectory of a stroke in a bedbound patient and risk for recurrent infection and blood clots, poor quality of life.  I explained to him that I do not think that enteral nutrition for a few days is going to make a lot of difference.  He said that his mother would not want to have a PEG tube and artificial nutrition in a case like this. -He is considering residential  hospice.  Will consult social worker for residential hospice referral.  He understand that at the residential hospice facility  Focus on comfort care, no fluids, no antibiotics.  -continue with fluids and antibiotics, until she is transition to hospice.  -He is considering moving forward to a more comfort care approach. -He wants to speak with palliative care again today.   2-A. Fib On Cardizem drip Was not on anticoagulation prior to admission.  Was only on aspirin.  Hypertension; Permissive hypertension  Chronic kidney disease a stage III IV fluids due to n.p.o. status  Leukocytosis; UTI; UA; with more than 50 WBC.  Follow urine culture.  Cover with ceftriaxone   Estimated body mass index is 19.23 kg/m as calculated from the following:   Height as of 05/17/19: 5\' 7"  (1.702 m).   Weight as of this encounter: 55.7 kg.   DVT prophylaxis: Lovenox Code Status: DNR, discussed with patient's son Family Communication: son in person 8/28 Disposition Plan: Remain in the hospital for further treatment of acute stroke Consultants:   Neurology  Palliative  care consult  Procedures:   ECHO  Antimicrobials:    Subjective: Lethargic non responsive  Objective: Vitals:   07/04/19 0420 07/04/19 0519 07/04/19 0700 07/04/19 1116  BP: 116/82 136/83 122/74 129/89  Pulse: 82 69 73 75  Resp: (!) 23 (!) 25    Temp:   (!) 97.2 F (36.2 C) 98.3 F (36.8 C)  TempSrc:   Axillary Axillary  SpO2:      Weight:        Intake/Output Summary (Last 24 hours) at 07/04/2019 1321 Last data filed at 07/04/2019 0600 Gross per 24 hour  Intake 1931.11 ml  Output 200 ml  Net 1731.11 ml   Filed Weights   07/02/19 2012  Weight: 55.7 kg    Examination:  General exam: Lethargic Respiratory system: B/L air movement.  Cardiovascular system: S 1, S 2 RRR Gastrointestinal system: BS present, soft, nt Central nervous system: lethargic, rigtht side weakness Extremities: trace edema Skin: no  rashes    Data Reviewed: I have personally reviewed following labs and imaging studies  CBC: Recent Labs  Lab 07/01/19 1435 07/02/19 0425  WBC 12.3* 13.4*  NEUTROABS 10.9* 11.2*  HGB 14.3 13.8  HCT 46.2* 43.6  MCV 83.4 81.8  PLT 368 935   Basic Metabolic Panel: Recent Labs  Lab 07/01/19 1435 07/02/19 0425  NA 138 139  K 3.5 3.9  CL 103 105  CO2 21* 22  GLUCOSE 115* 111*  BUN 20 22  CREATININE 1.39* 1.24*  CALCIUM 9.5 9.4   GFR: Estimated Creatinine Clearance: 26 mL/min (A) (by C-G formula based on SCr of 1.24 mg/dL (H)). Liver Function Tests: Recent Labs  Lab 07/01/19 1435 07/02/19 0425  AST 46* 38  ALT 15 14  ALKPHOS 206* 172*  BILITOT 1.4* 1.5*  PROT 7.4 7.2  ALBUMIN 3.3* 3.0*   No results for input(s): LIPASE, AMYLASE in the last 168 hours. No results for input(s): AMMONIA in the last 168 hours. Coagulation Profile: Recent Labs  Lab 07/01/19 1435  INR 1.1   Cardiac Enzymes: Recent Labs  Lab 07/01/19 1435  CKTOTAL 627*   BNP (last 3 results) No results for input(s): PROBNP in the last 8760 hours. HbA1C: Recent Labs    07/02/19 0425  HGBA1C 6.3*   CBG: Recent Labs  Lab 07/02/19 1043 07/02/19 1232  GLUCAP 97 104*   Lipid Profile: Recent Labs    07/02/19 0425  CHOL 187  HDL 50  LDLCALC 122*  TRIG 73  CHOLHDL 3.7   Thyroid Function Tests: No results for input(s): TSH, T4TOTAL, FREET4, T3FREE, THYROIDAB in the last 72 hours. Anemia Panel: No results for input(s): VITAMINB12, FOLATE, FERRITIN, TIBC, IRON, RETICCTPCT in the last 72 hours. Sepsis Labs: No results for input(s): PROCALCITON, LATICACIDVEN in the last 168 hours.  Recent Results (from the past 240 hour(s))  SARS CORONAVIRUS 2 (TAT 6-12 HRS) Nasal Swab Aptima Multi Swab     Status: None   Collection Time: 07/01/19  4:13 PM   Specimen: Aptima Multi Swab; Nasal Swab  Result Value Ref Range Status   SARS Coronavirus 2 NEGATIVE NEGATIVE Final    Comment:  (NOTE) SARS-CoV-2 target nucleic acids are NOT DETECTED. The SARS-CoV-2 RNA is generally detectable in upper and lower respiratory specimens during the acute phase of infection. Negative results do not preclude SARS-CoV-2 infection, do not rule out co-infections with other pathogens, and should not be used as the sole basis for treatment or other patient management decisions. Negative results must be combined with clinical observations, patient history, and epidemiological information. The expected result is Negative. Fact Sheet for Patients: SugarRoll.be Fact Sheet for Healthcare Providers: https://www.woods-mathews.com/ This test is not yet approved or cleared by the [place] FDA and  has been authorized for detection and/or diagnosis of SARS-CoV-2 by FDA under an Emergency Use Authorization (EUA). This EUA will remain  in effect (  meaning this test can be used) for the duration of the COVID-19 declaration under Section 56 4(b)(1) of the Act, 21 U.S.C. section 360bbb-3(b)(1), unless the authorization is terminated or revoked sooner. Performed at [facility], [place] [address], [place], [place] [address]          Radiology Studies: No results found.      Scheduled Meds:   stroke: mapping our early stages of recovery book   Does not apply Once   aspirin  300 mg Rectal Daily   enoxaparin (LOVENOX) injection  30 mg Subcutaneous Q24H   mouth rinse  15 mL Mouth Rinse BID   metoprolol tartrate  2.5 mg Intravenous Q6H   Continuous Infusions:  sodium chloride Stopped (07/03/19 1713)   cefTRIAXone (ROCEPHIN)  IV 1 g (07/03/19 1713)   diltiazem (CARDIZEM) infusion 10 mg/hr (07/04/19 0514)     LOS: 3 days    Time spent: 35 minutes    Elmarie Shiley, MD [facility] Pager [phone]  If 7PM-7AM, please contact night-coverage [email or web address] Password [email or web address] 07/04/2019, 1:21 PM

## 2019-07-04 NOTE — TOC Initial Note (Signed)
Transition of Care [facility]) - Initial/Assessment Note    Patient Details  Name: Shannon Cruz MRN: [id number] Date of Birth: 1925-02-08  Transition of Care [facility]) CM/SW Contact:    Gelene Mink, [place] Phone Number: 07/04/2019, 3:49 PM  Clinical Narrative:                  CSW received a consult for residential hospice placement from [place] [facility].   Edom called and made the referral with Anderson Malta with [facility]. She stated that they may not have any beds available until Monday. She stated that the patient had three people ahead of her on the waitlist.   CSW met with the patient's son at bedside. CSW informed him that she had made a referral to [facility]. CSW stated that they do not have beds available today but she would follow up with them once one was available.   CSW will continue to follow and assist with disposition.   Expected Discharge Plan: [place] Barriers to Discharge: Hospice Bed not available   Patient Goals and CMS Choice Patient states their goals for this hospitalization and ongoing recovery are:: Family would like to transition the patient to comfort care CMS Medicare.gov Compare Post Acute Care list provided to:: Patient Represenative (must comment) Choice offered to / list presented to : Adult Children  Expected Discharge Plan and Services Expected Discharge Plan: [place] In-house Referral: Clinical Social Work Discharge Planning Services: NA Post Acute Care Choice: Hospice Living arrangements for the past 2 months: Single Family Home                 DME Arranged: N/A DME Agency: NA       HH Arranged: NA [place] Agency: NA        Prior Living Arrangements/Services Living arrangements for the past 2 months: [place] Lives with:: Adult Children Patient language and need for interpreter reviewed:: No Do you feel safe going back to the place where you live?: No      Need for Family  Participation in Patient Care: Yes (Comment) Care giver support system in place?: Yes (comment)   Criminal Activity/Legal Involvement Pertinent to Current Situation/Hospitalization: No - Comment as needed  Activities of Daily Living Home Assistive Devices/Equipment: Bedside commode/3-in-1, Blood pressure cuff, Walker (specify type), Wheelchair ADL Screening (condition at time of admission) Patient's cognitive ability adequate to safely complete daily activities?: No Is the patient deaf or have difficulty hearing?: No Does the patient have difficulty seeing, even when wearing glasses/contacts?: No Does the patient have difficulty concentrating, remembering, or making decisions?: Yes Patient able to express need for assistance with ADLs?: No Does the patient have difficulty dressing or bathing?: Yes Independently performs ADLs?: No Communication: Dependent Is this a change from baseline?: Change from baseline, expected to last <3 days Dressing (OT): Needs assistance Is this a change from baseline?: Pre-admission baseline Grooming: Needs assistance Is this a change from baseline?: Pre-admission baseline Feeding: Dependent Is this a change from baseline?: Pre-admission baseline Bathing: Dependent Is this a change from baseline?: Change from baseline, expected to last <3 days Toileting: Dependent Is this a change from baseline?: Pre-admission baseline In/Out Bed: Dependent Is this a change from baseline?: Pre-admission baseline Walks in Home: Dependent Is this a change from baseline?: Pre-admission baseline Does the patient have difficulty walking or climbing stairs?: Yes Weakness of Legs: Left Weakness of Arms/Hands: Right  Permission Sought/Granted Permission sought to share information with : Case Manager  Permission granted to share information with : Yes, Verbal Permission Granted  Share Information with NAME: Lonnie  Permission granted to share info w AGENCY: [facility]  Permission granted to share info w Relationship: Son     Emotional Assessment Appearance:: Appears stated age Attitude/Demeanor/Rapport: Unable to Assess Affect (typically observed): Unable to Assess Orientation: : Fluctuating Orientation (Suspected and/or reported Sundowners) Alcohol / Substance Use: Not Applicable Psych Involvement: No (comment)  Admission diagnosis:  Fall [W19.XXXA] Patient Active Problem List   Diagnosis Date Noted  . Unresponsiveness   . Failure to thrive in adult   . Palliative care by specialist   . DNR (do not resuscitate)   . Dysphagia   . CVA (cerebral vascular accident) ([facility]) 07/01/2019  . Osteoarthritis 07/01/2019  . Atrial fibrillation with RVR ([facility]) 05/17/2019  . Atrial flutter ([facility]) 12/20/2018  . Bilateral leg edema 06/24/2017  . Abdominal pain 06/24/2017  . Atrial bigeminy 10/09/2013  . HTN (hypertension) 03/19/2013  . RBBB (right bundle branch block) 03/19/2013  . Left shoulder pain 03/19/2013   PCP:  Kadakia, Ajay, MD Pharmacy:   [id number] - [place], [place] - [address] [place] [address] Phone: [phone] Fax: [phone]     Social Determinants of Health (SDOH) Interventions    Readmission Risk Interventions No flowsheet data found.  

## 2019-07-04 NOTE — Progress Notes (Signed)
Patient ID: Shannon Cruz, female   DOB: [birth year], 85 y.o.   MRN: [id number]  This NP visited patient at the bedside as a follow up to  yesterday's [place], with son by telephone.  Patient is unresponsive, eyes closed, nonverbal, unable to follow commands, however she does appear comfortable.  Continued conversation regarding current medical situation.  We discussed the limitations of medical interventions when the body begins to fail to thrive.  We discussed that neurology feels that her prognosis is poor and she will not have any meaningful recovery.  Plan of Care: - DNR/DNI - No artificial feeding or hydration now or in the future - focus of care is comfort, quality and dignity - no further life prolonging measures (IV antibiotics, diagnostics, lab draws) -Symptom management -Prognosis is likely hours to days.  Family is open to residential hospice.  Discussed the natural trajectory and expectations at EOL.  Left a hard choices booklet at bedside.   Emotional support offered.  This is very difficult for her son Shannon Cruz to take on responsibility of decisions related to the shift to full comfort allowing for a natural death.  He verbalizes understanding of the limited prognosis and his main priority is "I do not want her to suffer".  Patient's son and daughter plan to visit today at bedside.  Questions and concerns addressed   Discussed with Dr Tyrell Antonio and bedside RN  Total time spent on the unit was 35 minutes  Greater than 50% of the time was spent in counseling and coordination of care  Wadie Lessen NP  Palliative Medicine Team Team Phone # [phone] Pager [phone]

## 2019-07-05 NOTE — Progress Notes (Signed)
PROGRESS NOTE    Shannon Cruz  VVO:[id number] DOB: Nov 09, 1924 DOA: 07/01/2019 PCP: Dixie Dials, MD   Brief Narrative: 85 year old with past medical history significant for hypertension, a flutter with variable AV block who presents after being found down at home by a neighbor.  At baseline patient lives at home alone and family comes in to check on her from time to time.  Patient was last noted to be in normal state of health on Sunday.  History is limited due to patient altered mental status and acute condition.  Patient was just recently hospitalized last month by Dr. Doylene Canard for permanent A. fib.  She was on aspirin and medication for rate control. Evaluation in the ED patient was found to have a large left MCA acute stroke.     Assessment & Plan:   Principal Problem:   CVA (cerebral vascular accident) ([place]) Active Problems:   HTN (hypertension)   Atrial fibrillation with RVR ([place])   Osteoarthritis   Palliative care by specialist   DNR (do not resuscitate)   Dysphagia   Unresponsiveness   Failure to thrive in adult   1-Acute large left MCA stroke -MRI positive for acute left MCA stroke. -Neurology consulted and following. -Echo Ef 55 % , mobile atheroma.  -On aspirin per rectum. -Fail speech. Continue with IV fluids for 24 hours.  -Patient is lethargic on my evaluation.  Discussed CODE STATUS with patient's son who is POA.  He agreed with making his mother DNR.  -Palliative care following.  -I spoke with son Leyendecker the  Afternoon of 8/28, regarding his mother condition and that she wont be able probably to walk again and or speak, I explained to him trajectory of a stroke in a bedbound patient and risk for recurrent infection and blood clots, poor quality of life.  I explained to him that I do not think that enteral nutrition for a few days is going to make a lot of difference.  He said that his mother would not want to have a PEG tube and artificial nutrition in a case  like this. -Family discussed with palliative care, patient is now full comfort care.   2-A. Fib On Cardizem drip Was not on anticoagulation prior to admission.  Was only on aspirin.  Hypertension; Permissive hypertension  Chronic kidney disease a stage III IV fluids due to n.p.o. status  Leukocytosis; UTI; e coli UA; with more than 50 WBC.  Follow urine culture.  Received IV ceftriaxone for 2 days, no significant improvement. Patient is now comfort care  Estimated body mass index is 19.23 kg/m as calculated from the following:   Height as of 05/17/19: 5\' 7"  (1.702 m).   Weight as of this encounter: 55.7 kg.   DVT prophylaxis: Lovenox Code Status: DNR, discussed with patient's son Family Communication: son in person 8/28 Disposition Plan: Remain in the hospital for further treatment of acute stroke Consultants:   Neurology  Palliative  care consult  Procedures:   ECHO  Antimicrobials:    Subjective: Lethargic, non responsive  Objective: Vitals:   07/04/19 1817 07/04/19 1952 07/05/19 0544 07/05/19 0746  BP: 120/84 128/82 (!) 138/91 126/80  Pulse: (!) 107 (!) 102 67 75  Resp: (!) 24   16  Temp: 98 F (36.7 C) 99.8 F (37.7 C) 98.7 F (37.1 C) 97.6 F (36.4 C)  TempSrc: Oral Oral Oral Oral  SpO2: 99% 99% 91% 95%  Weight:        Intake/Output Summary (Last 24  hours) at 07/05/2019 1326 Last data filed at 07/05/2019 9563 Gross per 24 hour  Intake 563.11 ml  Output 400 ml  Net 163.11 ml   Filed Weights   07/02/19 2012  Weight: 55.7 kg    Examination:  General exam: Lethargic Respiratory system: B/L air movement Cardiovascular system: S 1,. S  2 RRR Gastrointestinal system: BS present, soft, nt Central nervous system: obtunded Extremities:Trace edema Skin: no rashes    Data Reviewed: I have personally reviewed following labs and imaging studies  CBC: Recent Labs  Lab 07/01/19 1435 07/02/19 0425  WBC 12.3* 13.4*  NEUTROABS 10.9* 11.2*   HGB 14.3 13.8  HCT 46.2* 43.6  MCV 83.4 81.8  PLT 368 875   Basic Metabolic Panel: Recent Labs  Lab 07/01/19 1435 07/02/19 0425  NA 138 139  K 3.5 3.9  CL 103 105  CO2 21* 22  GLUCOSE 115* 111*  BUN 20 22  CREATININE 1.39* 1.24*  CALCIUM 9.5 9.4   GFR: Estimated Creatinine Clearance: 26 mL/min (A) (by C-G formula based on SCr of 1.24 mg/dL (H)). Liver Function Tests: Recent Labs  Lab 07/01/19 1435 07/02/19 0425  AST 46* 38  ALT 15 14  ALKPHOS 206* 172*  BILITOT 1.4* 1.5*  PROT 7.4 7.2  ALBUMIN 3.3* 3.0*   No results for input(s): LIPASE, AMYLASE in the last 168 hours. No results for input(s): AMMONIA in the last 168 hours. Coagulation Profile: Recent Labs  Lab 07/01/19 1435  INR 1.1   Cardiac Enzymes: Recent Labs  Lab 07/01/19 1435  CKTOTAL 627*   BNP (last 3 results) No results for input(s): PROBNP in the last 8760 hours. HbA1C: No results for input(s): HGBA1C in the last 72 hours. CBG: Recent Labs  Lab 07/02/19 1043 07/02/19 1232  GLUCAP 97 104*   Lipid Profile: No results for input(s): CHOL, HDL, LDLCALC, TRIG, CHOLHDL, LDLDIRECT in the last 72 hours. Thyroid Function Tests: No results for input(s): TSH, T4TOTAL, FREET4, T3FREE, THYROIDAB in the last 72 hours. Anemia Panel: No results for input(s): VITAMINB12, FOLATE, FERRITIN, TIBC, IRON, RETICCTPCT in the last 72 hours. Sepsis Labs: No results for input(s): PROCALCITON, LATICACIDVEN in the last 168 hours.  Recent Results (from the past 240 hour(s))  SARS CORONAVIRUS 2 (TAT 6-12 HRS) Nasal Swab Aptima Multi Swab     Status: None   Collection Time: 07/01/19  4:13 PM   Specimen: Aptima Multi Swab; Nasal Swab  Result Value Ref Range Status   SARS Coronavirus 2 NEGATIVE NEGATIVE Final    Comment: (NOTE) SARS-CoV-2 target nucleic acids are NOT DETECTED. The SARS-CoV-2 RNA is generally detectable in upper and lower respiratory specimens during the acute phase of infection. Negative results  do not preclude SARS-CoV-2 infection, do not rule out co-infections with other pathogens, and should not be used as the sole basis for treatment or other patient management decisions. Negative results must be combined with clinical observations, patient history, and epidemiological information. The expected result is Negative. Fact Sheet for Patients: SugarRoll.be Fact Sheet for Healthcare Providers: https://www.woods-mathews.com/ This test is not yet approved or cleared by the [place] FDA and  has been authorized for detection and/or diagnosis of SARS-CoV-2 by FDA under an Emergency Use Authorization (EUA). This EUA will remain  in effect (meaning this test can be used) for the duration of the COVID-19 declaration under Section 56 4(b)(1) of the Act, 21 U.S.C. section 360bbb-3(b)(1), unless the authorization is terminated or revoked sooner. Performed at [facility], [address] [place], [place] [address]   Urine Culture     Status: Abnormal (Preliminary result)   Collection Time: 07/03/19  1:31 PM   Specimen: Urine, Random  Result Value Ref Range Status   Specimen Description URINE, RANDOM  Final   Special Requests NONE  Final   Culture (A)  Final    >=100,000 COLONIES/mL ESCHERICHIA COLI 50,000 COLONIES/mL PROTEUS MIRABILIS SUSCEPTIBILITIES TO FOLLOW Performed at [facility], [place] [address] [place], [place] [address]    Report Status PENDING  Incomplete   Organism ID, Bacteria ESCHERICHIA COLI (A)  Final      Susceptibility   Escherichia coli - MIC*    AMPICILLIN >=32 RESISTANT Resistant     CEFAZOLIN <=4 SENSITIVE Sensitive     CEFTRIAXONE <=1 SENSITIVE Sensitive     CIPROFLOXACIN <=0.25 SENSITIVE Sensitive     GENTAMICIN <=1 SENSITIVE Sensitive     IMIPENEM <=0.25 SENSITIVE Sensitive     NITROFURANTOIN <=16 SENSITIVE Sensitive     TRIMETH/SULFA <=20 SENSITIVE Sensitive     AMPICILLIN/SULBACTAM 16  INTERMEDIATE Intermediate     PIP/TAZO <=4 SENSITIVE Sensitive     Extended ESBL NEGATIVE Sensitive     * >=100,000 COLONIES/mL ESCHERICHIA COLI         Radiology Studies: No results found.      Scheduled Meds: .  stroke: mapping our early stages of recovery book   Does not apply Once  . mouth rinse  15 mL Mouth Rinse BID   Continuous Infusions: . sodium chloride 10 mL/hr at 07/04/19 1547     LOS: 4 days    Time spent: 35 minutes    Elmarie Shiley, MD [facility] Pager [phone]  If 7PM-7AM, please contact night-coverage [email or web address] Password [email or web address] 07/05/2019, 1:26 PM

## 2019-07-05 NOTE — Progress Notes (Signed)
[facility])  Referral received for residential hospice at [facility] (on 8/29).    [facility] does not have a bed to offer today.  Reached out to family, no answer.  Left message acknowledging referral.  [facility] will update TOC manager and family should bed become available.    Venia Carbon RN, BSN, [facility] Liaison (in [place]) [phone]

## 2019-07-05 NOTE — Progress Notes (Signed)
Patient is comfort care, patient is resting comfortably

## 2019-07-05 NOTE — Consult Note (Signed)
Responded to request for spiritual consult for end of life. Pt was unavailable, nurse said family was there yesterday, he didn't know if they'd come tomorrow. Prayed for pt. Will pass on referral to day chaplain.  Rev. Eloise Levels Chaplain

## 2019-07-06 LAB — URINE CULTURE: Culture: >=100000 — AB

## 2019-07-06 NOTE — Progress Notes (Signed)
PROGRESS NOTE    Shannon Cruz  WER:[id number] DOB: 04/12/1925 DOA: 07/01/2019 PCP: Dixie Dials, MD   Brief Narrative: 85 year old with past medical history significant for hypertension, a flutter with variable AV block who presents after being found down at home by a neighbor.  At baseline patient lives at home alone and family comes in to check on her from time to time.  Patient was last noted to be in normal state of health on Sunday.  History is limited due to patient altered mental status and acute condition.  Patient was just recently hospitalized last month by Dr. Doylene Canard for permanent A. fib.  She was on aspirin and medication for rate control. Evaluation in the ED patient was found to have a large left MCA acute stroke.     Assessment & Plan:   Principal Problem:   CVA (cerebral vascular accident) ([place]) Active Problems:   HTN (hypertension)   Atrial fibrillation with RVR ([place])   Osteoarthritis   Palliative care by specialist   DNR (do not resuscitate)   Dysphagia   Unresponsiveness   Failure to thrive in adult   1-Acute large left MCA stroke -MRI positive for acute left MCA stroke. -Neurology consulted and following. -Echo Ef 55 % , mobile atheroma.  -On aspirin per rectum. -Fail speech. Continue with IV fluids for 24 hours.  -Patient is lethargic on my evaluation.  Discussed CODE STATUS with patient's son who is POA.  He agreed with making his mother DNR.  -Palliative care following.  -I spoke with son Shannon Cruz the  Afternoon of 8/28, regarding his mother condition and that she wont be able probably to walk again and or speak, I explained to him trajectory of a stroke in a bedbound patient and risk for recurrent infection and blood clots, poor quality of life.  I explained to him that I do not think that enteral nutrition for a few days is going to make a lot of difference.  He said that his mother would not want to have a PEG tube and artificial nutrition in a case  like this. -Family discussed with palliative care, patient is now full comfort care.  -Awaiting bed at residential hospice facility.  -patient with shallow breathing, lethargic.   2-A. Fib On Cardizem drip Was not on anticoagulation prior to admission.  Was only on aspirin.  Hypertension; Permissive hypertension  Chronic kidney disease a stage III IV fluids due to n.p.o. status  Leukocytosis; UTI; e coli UA; with more than 50 WBC.  Follow urine culture.  Received IV ceftriaxone for 2 days, no significant improvement. Patient is now comfort care  Estimated body mass index is 19.23 kg/m as calculated from the following:   Height as of 05/17/19: 5\' 7"  (1.702 m).   Weight as of this encounter: 55.7 kg.   DVT prophylaxis: Lovenox Code Status: DNR, discussed with patient's son Family Communication: son in person 8/28 Disposition Plan: Remain in the hospital for further treatment of acute stroke Consultants:   Neurology  Palliative  care consult  Procedures:   ECHO  Antimicrobials:    Subjective: Patient with shallow breathing, non responsive.   Objective: Vitals:   07/05/19 1601 07/05/19 2045 07/05/19 2342 07/06/19 0752  BP: 119/76 109/75 110/80 101/74  Pulse: (!) 105 (!) 110  72  Resp: 18 20 18  (!) 24  Temp:  98.2 F (36.8 C) 97.6 F (36.4 C) 98 F (36.7 C)  TempSrc:  Oral Axillary Axillary  SpO2:   92% 99%  Weight:       No intake or output data in the 24 hours ending 07/06/19 1401 Filed Weights   07/02/19 2012  Weight: 55.7 kg    Examination:  General exam: obtunded.  Respiratory system: shallow breathing  Cardiovascular system: S 1, S 2 RRR Gastrointestinal system: BS present, soft, nt Central nervous system:Obtunded.  Extremities:Trace edema Skin: no rashes    Data Reviewed: I have personally reviewed following labs and imaging studies  CBC: Recent Labs  Lab 07/01/19 1435 07/02/19 0425  WBC 12.3* 13.4*  NEUTROABS 10.9* 11.2*  HGB  14.3 13.8  HCT 46.2* 43.6  MCV 83.4 81.8  PLT 368 867   Basic Metabolic Panel: Recent Labs  Lab 07/01/19 1435 07/02/19 0425  NA 138 139  K 3.5 3.9  CL 103 105  CO2 21* 22  GLUCOSE 115* 111*  BUN 20 22  CREATININE 1.39* 1.24*  CALCIUM 9.5 9.4   GFR: Estimated Creatinine Clearance: 26 mL/min (A) (by C-G formula based on SCr of 1.24 mg/dL (H)). Liver Function Tests: Recent Labs  Lab 07/01/19 1435 07/02/19 0425  AST 46* 38  ALT 15 14  ALKPHOS 206* 172*  BILITOT 1.4* 1.5*  PROT 7.4 7.2  ALBUMIN 3.3* 3.0*   No results for input(s): LIPASE, AMYLASE in the last 168 hours. No results for input(s): AMMONIA in the last 168 hours. Coagulation Profile: Recent Labs  Lab 07/01/19 1435  INR 1.1   Cardiac Enzymes: Recent Labs  Lab 07/01/19 1435  CKTOTAL 627*   BNP (last 3 results) No results for input(s): PROBNP in the last 8760 hours. HbA1C: No results for input(s): HGBA1C in the last 72 hours. CBG: Recent Labs  Lab 07/02/19 1043 07/02/19 1232  GLUCAP 97 104*   Lipid Profile: No results for input(s): CHOL, HDL, LDLCALC, TRIG, CHOLHDL, LDLDIRECT in the last 72 hours. Thyroid Function Tests: No results for input(s): TSH, T4TOTAL, FREET4, T3FREE, THYROIDAB in the last 72 hours. Anemia Panel: No results for input(s): VITAMINB12, FOLATE, FERRITIN, TIBC, IRON, RETICCTPCT in the last 72 hours. Sepsis Labs: No results for input(s): PROCALCITON, LATICACIDVEN in the last 168 hours.  Recent Results (from the past 240 hour(s))  SARS CORONAVIRUS 2 (TAT 6-12 HRS) Nasal Swab Aptima Multi Swab     Status: None   Collection Time: 07/01/19  4:13 PM   Specimen: Aptima Multi Swab; Nasal Swab  Result Value Ref Range Status   SARS Coronavirus 2 NEGATIVE NEGATIVE Final    Comment: (NOTE) SARS-CoV-2 target nucleic acids are NOT DETECTED. The SARS-CoV-2 RNA is generally detectable in upper and lower respiratory specimens during the acute phase of infection. Negative results do not  preclude SARS-CoV-2 infection, do not rule out co-infections with other pathogens, and should not be used as the sole basis for treatment or other patient management decisions. Negative results must be combined with clinical observations, patient history, and epidemiological information. The expected result is Negative. Fact Sheet for Patients: SugarRoll.be Fact Sheet for Healthcare Providers: https://www.woods-mathews.com/ This test is not yet approved or cleared by the [place] FDA and  has been authorized for detection and/or diagnosis of SARS-CoV-2 by FDA under an Emergency Use Authorization (EUA). This EUA will remain  in effect (meaning this test can be used) for the duration of the COVID-19 declaration under Section 56 4(b)(1) of the Act, 21 U.S.C. section 360bbb-3(b)(1), unless the authorization is terminated or revoked sooner. Performed at [facility], [place] [address] [place], [place] [address]   Urine  Culture     Status: Abnormal   Collection Time: 07/03/19  1:31 PM   Specimen: Urine, Random  Result Value Ref Range Status   Specimen Description URINE, RANDOM  Final   Special Requests   Final    NONE Performed at [facility], [address], [place], [place] [address]    Culture (A)  Final    >=100,000 COLONIES/mL ESCHERICHIA COLI 50,000 COLONIES/mL PROTEUS MIRABILIS    Report Status 07/06/2019 FINAL  Final   Organism ID, Bacteria ESCHERICHIA COLI (A)  Final   Organism ID, Bacteria PROTEUS MIRABILIS (A)  Final      Susceptibility   Escherichia coli - MIC*    AMPICILLIN >=32 RESISTANT Resistant     CEFAZOLIN <=4 SENSITIVE Sensitive     CEFTRIAXONE <=1 SENSITIVE Sensitive     CIPROFLOXACIN <=0.25 SENSITIVE Sensitive     GENTAMICIN <=1 SENSITIVE Sensitive     IMIPENEM <=0.25 SENSITIVE Sensitive     NITROFURANTOIN <=16 SENSITIVE Sensitive     TRIMETH/SULFA <=20 SENSITIVE Sensitive     AMPICILLIN/SULBACTAM  16 INTERMEDIATE Intermediate     PIP/TAZO <=4 SENSITIVE Sensitive     Extended ESBL NEGATIVE Sensitive     * >=100,000 COLONIES/mL ESCHERICHIA COLI   Proteus mirabilis - MIC*    AMPICILLIN <=2 SENSITIVE Sensitive     CEFAZOLIN <=4 SENSITIVE Sensitive     CEFTRIAXONE <=1 SENSITIVE Sensitive     CIPROFLOXACIN <=0.25 SENSITIVE Sensitive     GENTAMICIN <=1 SENSITIVE Sensitive     IMIPENEM 2 SENSITIVE Sensitive     NITROFURANTOIN 128 RESISTANT Resistant     TRIMETH/SULFA <=20 SENSITIVE Sensitive     AMPICILLIN/SULBACTAM <=2 SENSITIVE Sensitive     PIP/TAZO <=4 SENSITIVE Sensitive     * 50,000 COLONIES/mL PROTEUS MIRABILIS         Radiology Studies: No results found.      Scheduled Meds: .  stroke: mapping our early stages of recovery book   Does not apply Once  . mouth rinse  15 mL Mouth Rinse BID   Continuous Infusions: . sodium chloride 10 mL/hr at 07/04/19 1547     LOS: 5 days    Time spent: 35 minutes    Elmarie Shiley, MD [facility] Pager [phone]  If 7PM-7AM, please contact night-coverage [email or web address] Password [email or web address] 07/06/2019, 2:01 PM

## 2019-07-06 NOTE — Progress Notes (Signed)
Patient ID: Shannon Cruz, female   DOB: 1925/02/13, 85 y.o.   MRN: [id number]  This NP visited patient at the bedside as a follow up to  yesterday's [place], with son by telephone.  Patient remains unresponsive, feet are mottled, shallow  Breaths.     Focus of care is comfort  Patient is unresponsive, eyes closed, nonverbal, unable to follow commands, however she does appear comfortable. Family is comfortable with decision with focus of care on comfort and dignity.  Plan of Care: - DNR/DNI - No artificial feeding or hydration now or in the future - focus of care is comfort, quality and dignity - no further life prolonging measures (IV antibiotics, diagnostics, lab draws) -Symptom management -Prognosis is likely hours to days.  Family is open to residential hospice.  Discussed the natural trajectory and expectations at EOL.    Emotional support offered.   Questions and concerns addressed   Discussed with bedside RN  Total time spent on the unit was  Minutes 15 minutes  Greater than 50% of the time was spent in counseling and coordination of care  Wadie Lessen NP  Palliative Medicine Team Team Phone # [phone] Pager [phone]

## 2019-07-07 DIAGNOSIS — R627 Adult failure to thrive: Secondary | ICD-10-CM

## 2019-07-07 DIAGNOSIS — I4891 Unspecified atrial fibrillation: Secondary | ICD-10-CM

## 2019-07-07 DIAGNOSIS — Z66 Do not resuscitate: Secondary | ICD-10-CM

## 2019-07-07 DIAGNOSIS — I63512 Cerebral infarction due to unspecified occlusion or stenosis of left middle cerebral artery: Principal | ICD-10-CM

## 2019-07-07 MED ORDER — MORPHINE SULFATE (CONCENTRATE) 10 MG/0.5ML PO SOLN: 5.0000 mg | ORAL | 0 refills | Status: AC | PRN

## 2019-07-07 MED ORDER — LORAZEPAM 1 MG PO TABS: 1.0000 mg | ORAL_TABLET | ORAL | 0 refills | Status: AC | PRN

## 2019-07-07 MED ORDER — ACETAMINOPHEN 650 MG RE SUPP: 650.0000 mg | RECTAL | 0 refills | Status: AC | PRN

## 2019-07-07 NOTE — Discharge Summary (Addendum)
Physician Discharge Summary  Shannon Cruz ZES:[id number] DOB: 10/02/1925 DOA: 07/01/2019  PCP: Dixie Dials, MD  Admit date: 07/01/2019 Discharge date: 07/14/2019  Admitted From: home  Disposition:  residential hospice   Recommendations for Outpatient Follow-up:  1. Discharge to residential hospice for comfort care.     Discharge Condition; guarded CODE STATUS: DNR, comfort care.  Diet recommendation: comfort feeding.    Brief/Interim Summary: 85 year old with past medical history significant for hypertension, a flutter with variable AV block who presents after being found down at home by a neighbor.  At baseline patient lives at home alone and family comes in to check on her from time to time.  Patient was last noted to be in normal state of health on Sunday.  History is limited due to patient altered mental status and acute condition.  Patient was just recently hospitalized last month by Dr. Doylene Canard for permanent A. fib.  She was on aspirin and medication for rate control. Evaluation in the ED patient was found to have a large left MCA acute stroke.   1-Acute large left MCA stroke -MRI positive for acute left MCA stroke. -Neurology consulted and following. -Echo Ef 55 % , mobile atheroma.  -On aspirin per rectum. -Fail speech. Continue with IV fluids for 24 hours.  -Patient is lethargic on my evaluation.  Discussed CODE STATUS with patient's son who is POA.  He agreed with making his mother DNR.  -Palliative care following.  -I spoke with son Wahlquist the  Afternoon of 8/28, regarding his mother condition and that she wont be able probably to walk again and or speak, I explained to him trajectory of a stroke in a bedbound patient and risk for recurrent infection and blood clots, poor quality of life.  I explained to him that I do not think that enteral nutrition for a few days is going to make a lot of difference.  He said that his mother would not want to have a PEG tube and  artificial nutrition in a case like this. -Family discussed with palliative care, patient is now full comfort care.  -Awaiting bed at residential hospice facility.  -repeated BP in the 110 range, plan to transfer [facility] today.   2-A. Fib On Cardizem drip Was not on anticoagulation prior to admission.  Was only on aspirin.  Hypertension; Permissive hypertension  Chronic kidney disease a stage III IV fluids due to n.p.o. status  Leukocytosis; UTI; e coli UA; with more than 50 WBC.  Follow urine culture.  Received IV ceftriaxone for 2 days, no significant improvement. Patient is now comfort care  Discharge Diagnoses:  Principal Problem:   CVA (cerebral vascular accident) [facility]) Active Problems:   HTN (hypertension)   Atrial fibrillation with RVR ([place])   Osteoarthritis   Palliative care by specialist   DNR (do not resuscitate)   Dysphagia   Unresponsiveness   Failure to thrive in adult    Discharge Instructions   Allergies as of 07/29/2019   No Known Allergies     Medication List    STOP taking these medications   acetaminophen 325 MG tablet Commonly known as: TYLENOL Replaced by: acetaminophen 650 MG suppository   amiodarone 200 MG tablet Commonly known as: PACERONE   aspirin 81 MG chewable tablet   bisacodyl 10 MG suppository Commonly known as: DULCOLAX   diclofenac sodium 1 % Gel Commonly known as: VOLTAREN   diltiazem 30 MG tablet Commonly known as: CARDIZEM   diltiazem 60 MG tablet  Commonly known as: CARDIZEM   furosemide 20 MG tablet Commonly known as: LASIX   hydrALAZINE 25 MG tablet Commonly known as: APRESOLINE   metoprolol tartrate 25 MG tablet Commonly known as: LOPRESSOR   mirtazapine 7.5 MG tablet Commonly known as: REMERON   potassium chloride 10 MEQ tablet Commonly known as: K-DUR     TAKE these medications   acetaminophen 650 MG suppository Commonly known as: TYLENOL Place 1 suppository (650 mg total) rectally  every 4 (four) hours as needed for mild pain (or temp > 37.5 C (99.5 F)). Replaces: acetaminophen 325 MG tablet   LORazepam 1 MG tablet Commonly known as: ATIVAN Take 1 tablet (1 mg total) by mouth every 4 (four) hours as needed for anxiety.   morphine CONCENTRATE 10 MG/0.5ML Soln concentrated solution Take 0.25 mLs (5 mg total) by mouth every hour as needed for moderate pain or shortness of breath.       No Known Allergies  Consultations:  Neurology  palliative   Procedures/Studies: Dg Chest 1 View  Result Date: 07/01/2019 CLINICAL DATA:  Suspect fall. EXAM: CHEST  1 VIEW COMPARISON:  Chest x-ray dated May 17, 2019. FINDINGS: Unchanged moderate cardiomegaly. Normal pulmonary vascularity. No focal consolidation, pleural effusion, or pneumothorax. IMPRESSION: No active disease. Electronically Signed   By: Titus Dubin M.D.   On: 07/01/2019 13:39   Ct Head Wo Contrast  Result Date: 07/01/2019 CLINICAL DATA:  Altered level of consciousness, found down, last seen normal 2 days prior EXAM: CT HEAD WITHOUT CONTRAST CT CERVICAL SPINE WITHOUT CONTRAST TECHNIQUE: Multidetector CT imaging of the head and cervical spine was performed following the standard protocol without intravenous contrast. Multiplanar CT image reconstructions of the cervical spine were also generated. COMPARISON:  CT head 05/17/2019 FINDINGS: CT HEAD FINDINGS Brain: Geographic region of hypoattenuation within the left frontal lobe, caudate, internal capsule and lentiform nucleus compatible with a left MCA distribution infarct. No evidence of hemorrhage, hydrocephalus, extra-axial collection or mass lesion/mass effect. Patchy areas of white matter hypoattenuation are most compatible with chronic microvascular angiopathy. Symmetric prominence of the ventricles, cisterns and sulci compatible with parenchymal volume loss. Vascular: Hyperattenuation of the left MCA concerning for acute/subacute thrombus. Atherosclerotic  calcification of the carotid siphons. Skull: No calvarial fracture or suspicious osseous lesion. No scalp swelling or hematoma. Sinuses/Orbits: Paranasal sinuses and mastoid air cells are predominantly clear. Orbital structures are unremarkable aside from prior lens extractions. Other: Edentulous with mandibular prognathism CT CERVICAL SPINE FINDINGS Alignment: Cervical stabilization collar in place. Slight reversal of the normal cervical lordosis centered at C5. Craniocervical and atlantoaxial alignment is maintained. Skull base and vertebrae: No acute fracture. No primary bone lesion or focal pathologic process. Extensive subcortical cystic changes noted in the dens. Soft tissues and spinal canal: No pre or paravertebral fluid or swelling. No visible canal hematoma. Disc levels: Multilevel intervertebral disc height loss with spondylitic endplate changes. Posterior disc osteophyte complexes are noted throughout the cervical spine resulting in at most mild canal stenosis. Uncinate spurring and facet hypertrophic changes result in multilevel neural foraminal narrowing, severe on the left at C2-3, C3-4 and C4-5 and mild severe on the right at C4-5 and C7-T1. Additional mild-to-moderate foraminal stenoses are seen diffusely Upper chest: Biapical pleuroparenchymal scarring. Upper lungs are otherwise clear. Extensive vascular calcification in the cervical carotids. Other: None. IMPRESSION: 1. Hyperattenuation of the left MCA concerning for acute/subacute thrombus. 2. Geographic region of hypoattenuation within the left frontal lobe, caudate, internal capsule, and lentiform nucleus compatible with a  left MCA distribution infarct. 3. No scalp swelling or calvarial fracture. 4. No acute cervical spine fracture. 5. Moderate to severe multilevel degenerative changes throughout the cervical spine, detailed above. These results were called by telephone at the time of interpretation on 07/01/2019 at 3:47 pm to Dr. Roslynn Amble , who  verbally acknowledged these results. Electronically Signed   By: Lovena Le M.D.   On: 07/01/2019 15:53   Ct Cervical Spine Wo Contrast  Result Date: 07/01/2019 CLINICAL DATA:  Altered level of consciousness, found down, last seen normal 2 days prior EXAM: CT HEAD WITHOUT CONTRAST CT CERVICAL SPINE WITHOUT CONTRAST TECHNIQUE: Multidetector CT imaging of the head and cervical spine was performed following the standard protocol without intravenous contrast. Multiplanar CT image reconstructions of the cervical spine were also generated. COMPARISON:  CT head 05/17/2019 FINDINGS: CT HEAD FINDINGS Brain: Geographic region of hypoattenuation within the left frontal lobe, caudate, internal capsule and lentiform nucleus compatible with a left MCA distribution infarct. No evidence of hemorrhage, hydrocephalus, extra-axial collection or mass lesion/mass effect. Patchy areas of white matter hypoattenuation are most compatible with chronic microvascular angiopathy. Symmetric prominence of the ventricles, cisterns and sulci compatible with parenchymal volume loss. Vascular: Hyperattenuation of the left MCA concerning for acute/subacute thrombus. Atherosclerotic calcification of the carotid siphons. Skull: No calvarial fracture or suspicious osseous lesion. No scalp swelling or hematoma. Sinuses/Orbits: Paranasal sinuses and mastoid air cells are predominantly clear. Orbital structures are unremarkable aside from prior lens extractions. Other: Edentulous with mandibular prognathism CT CERVICAL SPINE FINDINGS Alignment: Cervical stabilization collar in place. Slight reversal of the normal cervical lordosis centered at C5. Craniocervical and atlantoaxial alignment is maintained. Skull base and vertebrae: No acute fracture. No primary bone lesion or focal pathologic process. Extensive subcortical cystic changes noted in the dens. Soft tissues and spinal canal: No pre or paravertebral fluid or swelling. No visible canal  hematoma. Disc levels: Multilevel intervertebral disc height loss with spondylitic endplate changes. Posterior disc osteophyte complexes are noted throughout the cervical spine resulting in at most mild canal stenosis. Uncinate spurring and facet hypertrophic changes result in multilevel neural foraminal narrowing, severe on the left at C2-3, C3-4 and C4-5 and mild severe on the right at C4-5 and C7-T1. Additional mild-to-moderate foraminal stenoses are seen diffusely Upper chest: Biapical pleuroparenchymal scarring. Upper lungs are otherwise clear. Extensive vascular calcification in the cervical carotids. Other: None. IMPRESSION: 1. Hyperattenuation of the left MCA concerning for acute/subacute thrombus. 2. Geographic region of hypoattenuation within the left frontal lobe, caudate, internal capsule, and lentiform nucleus compatible with a left MCA distribution infarct. 3. No scalp swelling or calvarial fracture. 4. No acute cervical spine fracture. 5. Moderate to severe multilevel degenerative changes throughout the cervical spine, detailed above. These results were called by telephone at the time of interpretation on 07/01/2019 at 3:47 pm to Dr. Roslynn Amble , who verbally acknowledged these results. Electronically Signed   By: Lovena Le M.D.   On: 07/01/2019 15:53   Mr Angio Head Wo Contrast  Addendum Date: 07/01/2019   ADDENDUM REPORT: 07/01/2019 20:38 ADDENDUM: These results were called by telephone at the time of interpretation on 07/01/2019 at 8:15 pm to Dr. Christia Reading OPYD, who verbally acknowledged these results. Electronically Signed   By: Ulyses Jarred M.D.   On: 07/01/2019 20:38   Result Date: 07/01/2019 CLINICAL DATA:  Found down at home.  History of atrial fibrillation. EXAM: MR HEAD WITHOUT CONTRAST MR CIRCLE OF WILLIS WITHOUT CONTRAST MRA OF THE NECK WITHOUT AND WITH  CONTRAST TECHNIQUE: Multiplanar, multiecho pulse sequences of the brain, circle of willis and surrounding structures were obtained  without intravenous contrast. Angiographic images of the neck were obtained using MRA technique without and with intravenous contrast. CONTRAST:  5 mL Gadavist COMPARISON:  Head CT same day FINDINGS: MRI HEAD FINDINGS BRAIN: The midline structures are normal. There is a large area of abnormal diffusion restriction within the left MCA territory. Early confluent hyperintense T2-weighted signal of the periventricular and deep white matter, most commonly due to chronic ischemic microangiopathy. Generalized atrophy without lobar predilection. Blood-sensitive sequences show no chronic microhemorrhage or superficial siderosis. SKULL AND UPPER CERVICAL SPINE: The visualized skull base, calvarium, upper cervical spine and extracranial soft tissues are normal. SINUSES/ORBITS: No fluid levels or advanced mucosal thickening. No mastoid or middle ear effusion. The orbits are normal. MRA HEAD FINDINGS POSTERIOR CIRCULATION: --Vertebral arteries: Normal V4 segments. --Posterior inferior cerebellar arteries (PICA): Patent origins from the vertebral arteries. --Anterior inferior cerebellar arteries (AICA): Normal on the right. Not clearly visualized on the left. --Basilar artery: Normal. --Superior cerebellar arteries: Normal. --Posterior cerebral arteries: Normal. Both are predominantly supplied by the posterior communicating arteries (p-comm). ANTERIOR CIRCULATION: --Intracranial internal carotid arteries: Normal. --Anterior cerebral arteries (ACA): Normal. Both A1 segments are present. Patent anterior communicating artery (a-comm). --Middle cerebral arteries (MCA): There is complete occlusion of the left middle cerebral artery at the proximal M1 segment. Right MCA is normal. MRA NECK FINDINGS Aortic arch: Normal 3 vessel aortic branching pattern. The visualized subclavian arteries are normal. Right carotid system: Narrowing at the right carotid bifurcation results in approximately 50% stenosis of the proximal internal carotid  artery. Left carotid system: Normal course and caliber without stenosis or evidence of dissection. Vertebral arteries: Right dominant. The left vertebral artery is occluded from its origin. There is reconstitution of the V4 segment secondary to collateral flow across the vertebral confluence and the PICA pathway. IMPRESSION: 1. Occlusion of the left middle cerebral artery at the origin of the M1 segment with complete left MCA territory infarct. 2. No hemorrhage or mass effect. 3. 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 No posterior circulation infarct, suggesting that this is chronic. 4. Approximately 50% stenosis of the proximal right internal carotid artery. Electronically Signed: By: Ulyses Jarred M.D. On: 07/01/2019 19:57   Mr Angiogram Neck W Or Wo Contrast  Addendum Date: 07/01/2019   ADDENDUM REPORT: 07/01/2019 20:38 ADDENDUM: These results were called by telephone at the time of interpretation on 07/01/2019 at 8:15 pm to Dr. Christia Reading OPYD, who verbally acknowledged these results. Electronically Signed   By: Ulyses Jarred M.D.   On: 07/01/2019 20:38   Result Date: 07/01/2019 CLINICAL DATA:  Found down at home.  History of atrial fibrillation. EXAM: MR HEAD WITHOUT CONTRAST MR CIRCLE OF WILLIS WITHOUT CONTRAST MRA OF THE NECK WITHOUT AND WITH CONTRAST TECHNIQUE: Multiplanar, multiecho pulse sequences of the brain, circle of willis and surrounding structures were obtained without intravenous contrast. Angiographic images of the neck were obtained using MRA technique without and with intravenous contrast. CONTRAST:  5 mL Gadavist COMPARISON:  Head CT same day FINDINGS: MRI HEAD FINDINGS BRAIN: The midline structures are normal. There is a large area of abnormal diffusion restriction within the left MCA territory. Early confluent hyperintense T2-weighted signal of the periventricular and deep white matter, most commonly due to chronic  ischemic microangiopathy. Generalized atrophy without lobar predilection. Blood-sensitive sequences show no chronic microhemorrhage or superficial  siderosis. SKULL AND UPPER CERVICAL SPINE: The visualized skull base, calvarium, upper cervical spine and extracranial soft tissues are normal. SINUSES/ORBITS: No fluid levels or advanced mucosal thickening. No mastoid or middle ear effusion. The orbits are normal. MRA HEAD FINDINGS POSTERIOR CIRCULATION: --Vertebral arteries: Normal V4 segments. --Posterior inferior cerebellar arteries (PICA): Patent origins from the vertebral arteries. --Anterior inferior cerebellar arteries (AICA): Normal on the right. Not clearly visualized on the left. --Basilar artery: Normal. --Superior cerebellar arteries: Normal. --Posterior cerebral arteries: Normal. Both are predominantly supplied by the posterior communicating arteries (p-comm). ANTERIOR CIRCULATION: --Intracranial internal carotid arteries: Normal. --Anterior cerebral arteries (ACA): Normal. Both A1 segments are present. Patent anterior communicating artery (a-comm). --Middle cerebral arteries (MCA): There is complete occlusion of the left middle cerebral artery at the proximal M1 segment. Right MCA is normal. MRA NECK FINDINGS Aortic arch: Normal 3 vessel aortic branching pattern. The visualized subclavian arteries are normal. Right carotid system: Narrowing at the right carotid bifurcation results in approximately 50% stenosis of the proximal internal carotid artery. Left carotid system: Normal course and caliber without stenosis or evidence of dissection. Vertebral arteries: Right dominant. The left vertebral artery is occluded from its origin. There is reconstitution of the V4 segment secondary to collateral flow across the vertebral confluence and the PICA pathway. IMPRESSION: 1. Occlusion of the left middle cerebral artery at the origin of the M1 segment with complete left MCA territory infarct. 2. No hemorrhage or mass  effect. 3. 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 No posterior circulation infarct, suggesting that this is chronic. 4. Approximately 50% stenosis of the proximal right internal carotid artery. Electronically Signed: By: Ulyses Jarred M.D. On: 07/01/2019 19:57   Mr Brain Wo Contrast  Addendum Date: 07/01/2019   ADDENDUM REPORT: 07/01/2019 20:38 ADDENDUM: These results were called by telephone at the time of interpretation on 07/01/2019 at 8:15 pm to Dr. Christia Reading OPYD, who verbally acknowledged these results. Electronically Signed   By: Ulyses Jarred M.D.   On: 07/01/2019 20:38   Result Date: 07/01/2019 CLINICAL DATA:  Found down at home.  History of atrial fibrillation. EXAM: MR HEAD WITHOUT CONTRAST MR CIRCLE OF WILLIS WITHOUT CONTRAST MRA OF THE NECK WITHOUT AND WITH CONTRAST TECHNIQUE: Multiplanar, multiecho pulse sequences of the brain, circle of willis and surrounding structures were obtained without intravenous contrast. Angiographic images of the neck were obtained using MRA technique without and with intravenous contrast. CONTRAST:  5 mL Gadavist COMPARISON:  Head CT same day FINDINGS: MRI HEAD FINDINGS BRAIN: The midline structures are normal. There is a large area of abnormal diffusion restriction within the left MCA territory. Early confluent hyperintense T2-weighted signal of the periventricular and deep white matter, most commonly due to chronic ischemic microangiopathy. Generalized atrophy without lobar predilection. Blood-sensitive sequences show no chronic microhemorrhage or superficial siderosis. SKULL AND UPPER CERVICAL SPINE: The visualized skull base, calvarium, upper cervical spine and extracranial soft tissues are normal. SINUSES/ORBITS: No fluid levels or advanced mucosal thickening. No mastoid or middle ear effusion. The orbits are normal. MRA HEAD FINDINGS POSTERIOR CIRCULATION: --Vertebral arteries: Normal V4  segments. --Posterior inferior cerebellar arteries (PICA): Patent origins from the vertebral arteries. --Anterior inferior cerebellar arteries (AICA): Normal on the right. Not clearly visualized on the left. --Basilar artery: Normal. --Superior cerebellar arteries: Normal. --Posterior cerebral arteries: Normal. Both are predominantly supplied by the posterior communicating arteries (p-comm). ANTERIOR CIRCULATION: --Intracranial internal carotid arteries: Normal. --Anterior cerebral arteries (ACA):  Normal. Both A1 segments are present. Patent anterior communicating artery (a-comm). --Middle cerebral arteries (MCA): There is complete occlusion of the left middle cerebral artery at the proximal M1 segment. Right MCA is normal. MRA NECK FINDINGS Aortic arch: Normal 3 vessel aortic branching pattern. The visualized subclavian arteries are normal. Right carotid system: Narrowing at the right carotid bifurcation results in approximately 50% stenosis of the proximal internal carotid artery. Left carotid system: Normal course and caliber without stenosis or evidence of dissection. Vertebral arteries: Right dominant. The left vertebral artery is occluded from its origin. There is reconstitution of the V4 segment secondary to collateral flow across the vertebral confluence and the PICA pathway. IMPRESSION: 1. Occlusion of the left middle cerebral artery at the origin of the M1 segment with complete left MCA territory infarct. 2. No hemorrhage or mass effect. 3. 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 No posterior circulation infarct, suggesting that this is chronic. 4. Approximately 50% stenosis of the proximal right internal carotid artery. Electronically Signed: By: Ulyses Jarred M.D. On: 07/01/2019 19:57   Dg Hip Unilat W Or Wo Pelvis 2-3 Views Right  Result Date: 07/01/2019 CLINICAL DATA:  Fall. EXAM: DG HIP (WITH OR WITHOUT PELVIS) 2-3V RIGHT  COMPARISON:  Right hip x-rays dated May 17, 2019. FINDINGS: No acute fracture or dislocation. Unchanged end-stage right hip osteoarthritis with superolateral subluxation of the femoral head. Osteopenia. Soft tissues are unremarkable. IMPRESSION: 1.  No acute osseous abnormality. 2. Unchanged end-stage right hip osteoarthritis. Electronically Signed   By: Titus Dubin M.D.   On: 07/01/2019 13:42    Subjective: lethargic  Discharge Exam: Vitals:   07/23/2019 1105 07/23/2019 1109  BP: (!) 70/56 116/90  Pulse: 62 66  Resp:    Temp:    SpO2:       General: lethargic Cardiovascular: RRR, S1/S2 Respiratory; tachypnea, tachycardia Abdominal: Soft, NT, ND, bowel sounds +     The results of significant diagnostics from this hospitalization (including imaging, microbiology, ancillary and laboratory) are listed below for reference.     Microbiology: Recent Results (from the past 240 hour(s))  SARS CORONAVIRUS 2 (TAT 6-12 HRS) Nasal Swab Aptima Multi Swab     Status: None   Collection Time: 07/01/19  4:13 PM   Specimen: Aptima Multi Swab; Nasal Swab  Result Value Ref Range Status   SARS Coronavirus 2 NEGATIVE NEGATIVE Final    Comment: (NOTE) SARS-CoV-2 target nucleic acids are NOT DETECTED. The SARS-CoV-2 RNA is generally detectable in upper and lower respiratory specimens during the acute phase of infection. Negative results do not preclude SARS-CoV-2 infection, do not rule out co-infections with other pathogens, and should not be used as the sole basis for treatment or other patient management decisions. Negative results must be combined with clinical observations, patient history, and epidemiological information. The expected result is Negative. Fact Sheet for Patients: SugarRoll.be Fact Sheet for Healthcare Providers: https://www.woods-mathews.com/ This test is not yet approved or cleared by the [place] FDA and  has been  authorized for detection and/or diagnosis of SARS-CoV-2 by FDA under an Emergency Use Authorization (EUA). This EUA will remain  in effect (meaning this test can be used) for the duration of the COVID-19 declaration under Section 56 4(b)(1) of the Act, 21 U.S.C. section 360bbb-3(b)(1), unless the authorization is terminated or revoked sooner. Performed at [facility], [place] [address] [place], [place] [address]   Urine Culture  Status: Abnormal   Collection Time: 07/03/19  1:31 PM   Specimen: Urine, Random  Result Value Ref Range Status   Specimen Description URINE, RANDOM  Final   Special Requests   Final    NONE Performed at [facility], [address], [place], [place] [id number]    Culture (A)  Final    >=100,000 COLONIES/mL ESCHERICHIA COLI 50,000 COLONIES/mL PROTEUS MIRABILIS    Report Status 07/06/2019 FINAL  Final   Organism ID, Bacteria ESCHERICHIA COLI (A)  Final   Organism ID, Bacteria PROTEUS MIRABILIS (A)  Final      Susceptibility   Escherichia coli - MIC*    AMPICILLIN >=32 RESISTANT Resistant     CEFAZOLIN <=4 SENSITIVE Sensitive     CEFTRIAXONE <=1 SENSITIVE Sensitive     CIPROFLOXACIN <=0.25 SENSITIVE Sensitive     GENTAMICIN <=1 SENSITIVE Sensitive     IMIPENEM <=0.25 SENSITIVE Sensitive     NITROFURANTOIN <=16 SENSITIVE Sensitive     TRIMETH/SULFA <=20 SENSITIVE Sensitive     AMPICILLIN/SULBACTAM 16 INTERMEDIATE Intermediate     PIP/TAZO <=4 SENSITIVE Sensitive     Extended ESBL NEGATIVE Sensitive     * >=100,000 COLONIES/mL ESCHERICHIA COLI   Proteus mirabilis - MIC*    AMPICILLIN <=2 SENSITIVE Sensitive     CEFAZOLIN <=4 SENSITIVE Sensitive     CEFTRIAXONE <=1 SENSITIVE Sensitive     CIPROFLOXACIN <=0.25 SENSITIVE Sensitive     GENTAMICIN <=1 SENSITIVE Sensitive     IMIPENEM 2 SENSITIVE Sensitive     NITROFURANTOIN 128 RESISTANT Resistant     TRIMETH/SULFA <=20 SENSITIVE Sensitive     AMPICILLIN/SULBACTAM <=2 SENSITIVE Sensitive      PIP/TAZO <=4 SENSITIVE Sensitive     * 50,000 COLONIES/mL PROTEUS MIRABILIS     Labs: BNP (last 3 results) No results for input(s): BNP in the last 8760 hours. Basic Metabolic Panel: Recent Labs  Lab 07/01/19 1435 07/02/19 0425  NA 138 139  K 3.5 3.9  CL 103 105  CO2 21* 22  GLUCOSE 115* 111*  BUN 20 22  CREATININE 1.39* 1.24*  CALCIUM 9.5 9.4   Liver Function Tests: Recent Labs  Lab 07/01/19 1435 07/02/19 0425  AST 46* 38  ALT 15 14  ALKPHOS 206* 172*  BILITOT 1.4* 1.5*  PROT 7.4 7.2  ALBUMIN 3.3* 3.0*   No results for input(s): LIPASE, AMYLASE in the last 168 hours. No results for input(s): AMMONIA in the last 168 hours. CBC: Recent Labs  Lab 07/01/19 1435 07/02/19 0425  WBC 12.3* 13.4*  NEUTROABS 10.9* 11.2*  HGB 14.3 13.8  HCT 46.2* 43.6  MCV 83.4 81.8  PLT 368 378   Cardiac Enzymes: Recent Labs  Lab 07/01/19 1435  CKTOTAL 627*   BNP: Invalid input(s): POCBNP CBG: Recent Labs  Lab 07/02/19 1043 07/02/19 1232  GLUCAP 97 104*   D-Dimer No results for input(s): DDIMER in the last 72 hours. Hgb A1c No results for input(s): HGBA1C in the last 72 hours. Lipid Profile No results for input(s): CHOL, HDL, LDLCALC, TRIG, CHOLHDL, LDLDIRECT in the last 72 hours. Thyroid function studies No results for input(s): TSH, T4TOTAL, T3FREE, THYROIDAB in the last 72 hours.  Invalid input(s): FREET3 Anemia work up No results for input(s): VITAMINB12, FOLATE, FERRITIN, TIBC, IRON, RETICCTPCT in the last 72 hours. Urinalysis    Component Value Date/Time   COLORURINE AMBER (A) 07/03/2019 1707   APPEARANCEUR CLOUDY (A) 07/03/2019 1707   LABSPEC 1.026 07/03/2019 1707   PHURINE  5.0 07/03/2019 1707   GLUCOSEU NEGATIVE 07/03/2019 1707   HGBUR SMALL (A) 07/03/2019 1707   BILIRUBINUR NEGATIVE 07/03/2019 1707   KETONESUR 5 (A) 07/03/2019 1707   PROTEINUR >=300 (A) 07/03/2019 1707   UROBILINOGEN 0.2 01/24/2015 0736   NITRITE NEGATIVE 07/03/2019 1707    LEUKOCYTESUR LARGE (A) 07/03/2019 1707   Sepsis Labs Invalid input(s): PROCALCITONIN,  WBC,  LACTICIDVEN Microbiology Recent Results (from the past 240 hour(s))  SARS CORONAVIRUS 2 (TAT 6-12 HRS) Nasal Swab Aptima Multi Swab     Status: None   Collection Time: 07/01/19  4:13 PM   Specimen: Aptima Multi Swab; Nasal Swab  Result Value Ref Range Status   SARS Coronavirus 2 NEGATIVE NEGATIVE Final    Comment: (NOTE) SARS-CoV-2 target nucleic acids are NOT DETECTED. The SARS-CoV-2 RNA is generally detectable in upper and lower respiratory specimens during the acute phase of infection. Negative results do not preclude SARS-CoV-2 infection, do not rule out co-infections with other pathogens, and should not be used as the sole basis for treatment or other patient management decisions. Negative results must be combined with clinical observations, patient history, and epidemiological information. The expected result is Negative. Fact Sheet for Patients: SugarRoll.be Fact Sheet for Healthcare Providers: https://www.woods-mathews.com/ This test is not yet approved or cleared by the [place] FDA and  has been authorized for detection and/or diagnosis of SARS-CoV-2 by FDA under an Emergency Use Authorization (EUA). This EUA will remain  in effect (meaning this test can be used) for the duration of the COVID-19 declaration under Section 56 4(b)(1) of the Act, 21 U.S.C. section 360bbb-3(b)(1), unless the authorization is terminated or revoked sooner. Performed at [facility], [place] [address], [place], [place] [address]   Urine Culture     Status: Abnormal   Collection Time: 07/03/19  1:31 PM   Specimen: Urine, Random  Result Value Ref Range Status   Specimen Description URINE, RANDOM  Final   Special Requests   Final    NONE Performed at [facility], [place] [address], [place], [place] [address]    Culture (A)  Final    >=100,000  COLONIES/mL ESCHERICHIA COLI 50,000 COLONIES/mL PROTEUS MIRABILIS    Report Status 07/06/2019 FINAL  Final   Organism ID, Bacteria ESCHERICHIA COLI (A)  Final   Organism ID, Bacteria PROTEUS MIRABILIS (A)  Final      Susceptibility   Escherichia coli - MIC*    AMPICILLIN >=32 RESISTANT Resistant     CEFAZOLIN <=4 SENSITIVE Sensitive     CEFTRIAXONE <=1 SENSITIVE Sensitive     CIPROFLOXACIN <=0.25 SENSITIVE Sensitive     GENTAMICIN <=1 SENSITIVE Sensitive     IMIPENEM <=0.25 SENSITIVE Sensitive     NITROFURANTOIN <=16 SENSITIVE Sensitive     TRIMETH/SULFA <=20 SENSITIVE Sensitive     AMPICILLIN/SULBACTAM 16 INTERMEDIATE Intermediate     PIP/TAZO <=4 SENSITIVE Sensitive     Extended ESBL NEGATIVE Sensitive     * >=100,000 COLONIES/mL ESCHERICHIA COLI   Proteus mirabilis - MIC*    AMPICILLIN <=2 SENSITIVE Sensitive     CEFAZOLIN <=4 SENSITIVE Sensitive     CEFTRIAXONE <=1 SENSITIVE Sensitive     CIPROFLOXACIN <=0.25 SENSITIVE Sensitive     GENTAMICIN <=1 SENSITIVE Sensitive     IMIPENEM 2 SENSITIVE Sensitive     NITROFURANTOIN 128 RESISTANT Resistant     TRIMETH/SULFA <=20 SENSITIVE Sensitive     AMPICILLIN/SULBACTAM <=2 SENSITIVE Sensitive     PIP/TAZO <=4 SENSITIVE Sensitive     *  50,000 COLONIES/mL PROTEUS MIRABILIS     Time coordinating discharge: 40 minutes  SIGNED:   Elmarie Shiley, MD  [facility]

## 2019-07-07 NOTE — Care Management Important Message (Signed)
Important Message  Patient Details  Name: Shannon Cruz MRN: [id number] Date of Birth: 10/06/1925   Medicare Important Message Given:  Yes     Eyvonne Burchfield 08/05/2019, 12:40 PM

## 2019-07-07 NOTE — TOC Transition Note (Signed)
Transition of Care [facility]) - CM/SW Discharge Note   Patient Details  Name: Shannon Cruz MRN: [id number] Date of Birth: 10/17/1925  [facility] CM/SW Contact:  Geralynn Ochs, LCSW Phone Number: 07/26/2019, 12:41 PM   Clinical Narrative:   Nurse to call report to [phone]    Final next level of care: [place] Barriers to Discharge: Barriers Resolved   Patient Goals and CMS Choice Patient states their goals for this hospitalization and ongoing recovery are:: Family would like to transition the patient to comfort care CMS Medicare.gov Compare Post Acute Care list provided to:: Patient Represenative (must comment) Choice offered to / list presented to : Adult Children  Discharge Placement                Patient to be transferred to facility by: [facility] Name of family member notified: Son Patient and family notified of of transfer: 07/11/2019  Discharge Plan and Services In-house Referral: Clinical Social Work Discharge Planning Services: NA Post Acute Care Choice: Hospice          DME Arranged: N/A DME Agency: NA       HH Arranged: NA HH Agency: NA        Social Determinants of Health (SDOH) Interventions     Readmission Risk Interventions No flowsheet data found.

## 2019-07-20 DIAGNOSIS — I4891 Unspecified atrial fibrillation: Secondary | ICD-10-CM | POA: Diagnosis not present

## 2019-08-06 DEATH — deceased

## 2019-12-18 IMAGING — CT CT HEAD WITHOUT CONTRAST
4 of 8 series · 15 of 47 positions shown, 17 images · non-contrast
Comparison: CT head 05/17/2019

CLINICAL DATA: Altered level of consciousness, found down, last
seen normal 2 days prior

EXAM:
CT HEAD WITHOUT CONTRAST
CT CERVICAL SPINE WITHOUT CONTRAST
TECHNIQUE: Multidetector CT imaging of the head and cervical spine was
performed following the standard protocol without intravenous
contrast. Multiplanar CT image reconstructions of the cervical spine
were also generated.

[Series 3: head bone · axial · 0.46mm/px · z∈[+1283,+1407]mm · 5 of 94 slices shown, 7 images]
[im 16/94  brain]
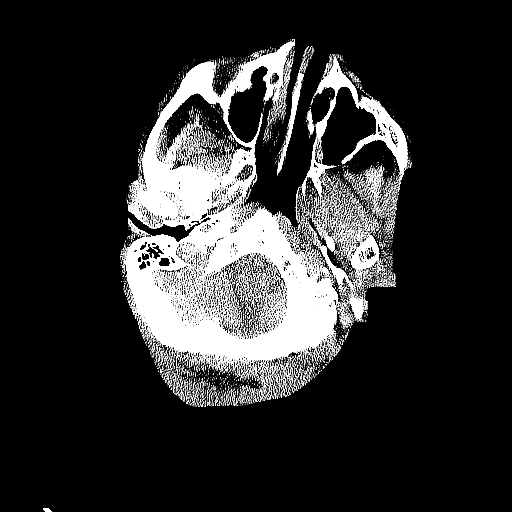
[im 16/94  bone]
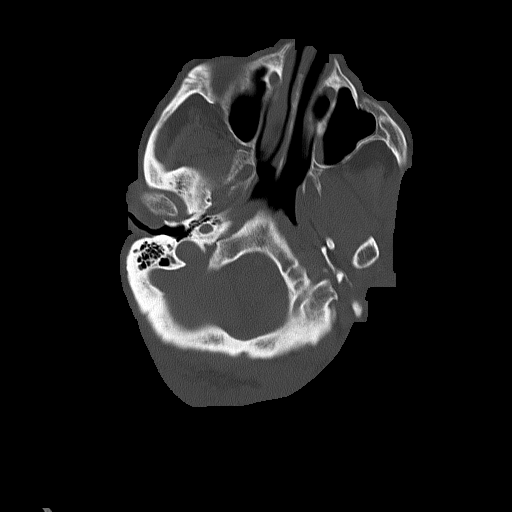
[im 32/94  brain]
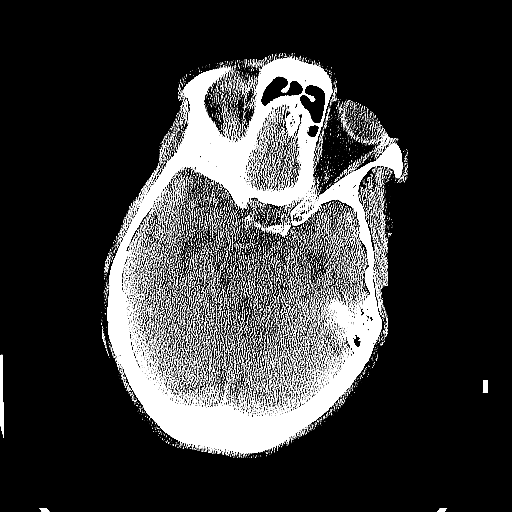
[im 47/94  brain]
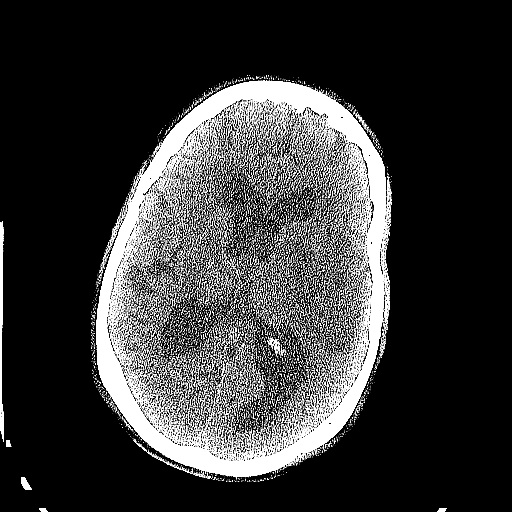
[im 63/94  brain]
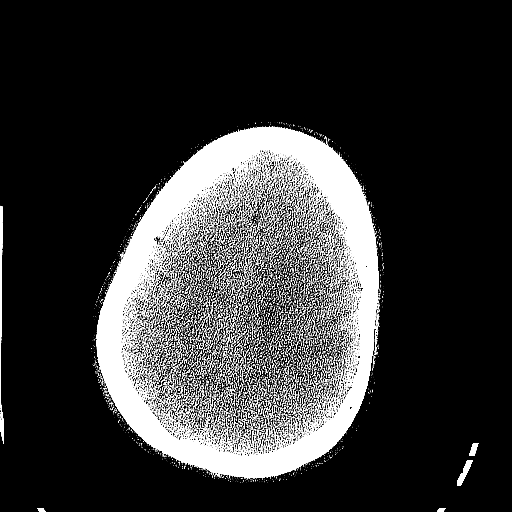
[im 78/94  brain]
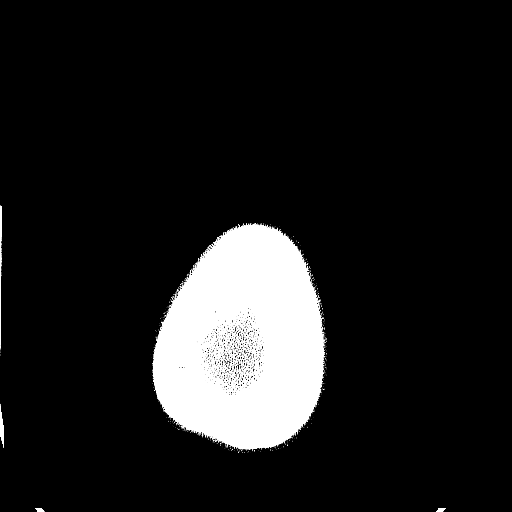
[im 78/94  bone]
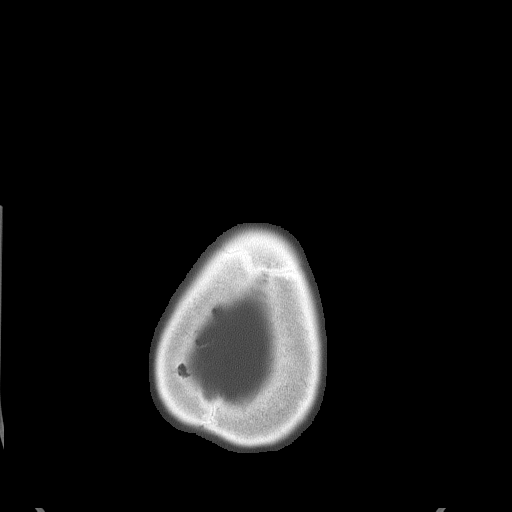

[Series 6: head without cor · coronal · non-contrast · 0.31mm/px · 3 of 68 slices shown]
[im 20/68  brain]
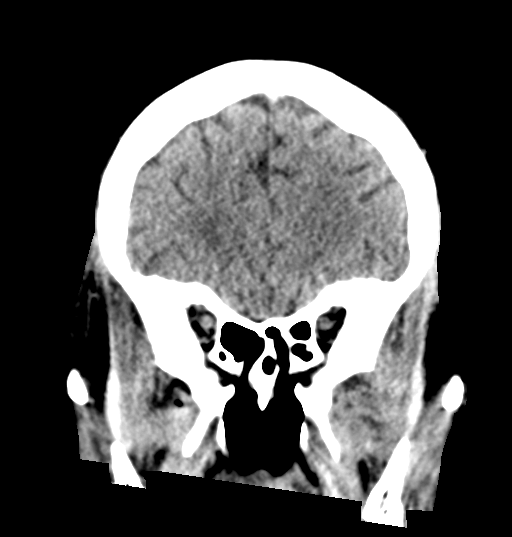
[im 29/68  brain]
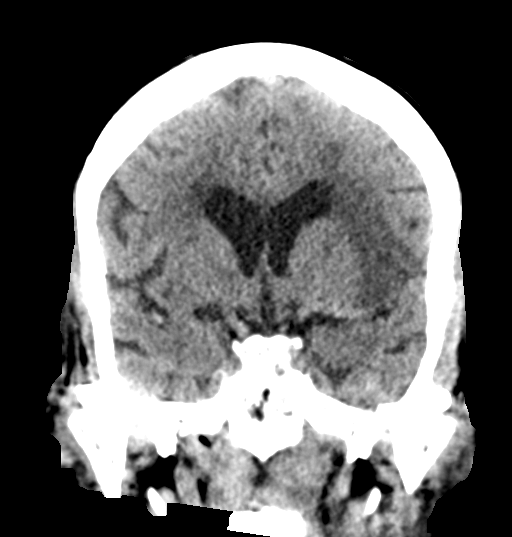
[im 39/68  brain]
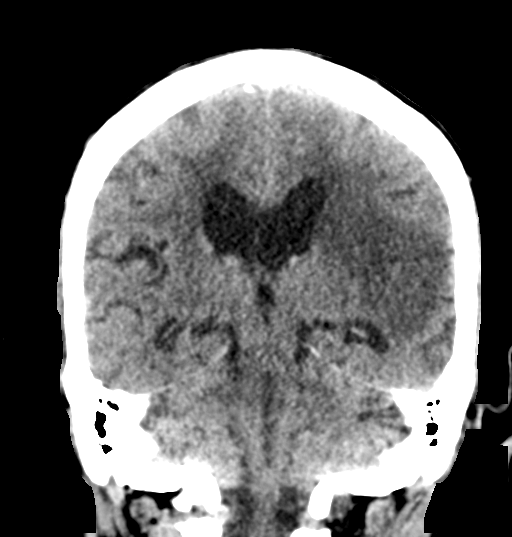

[Series 7: head without sag · sagittal · non-contrast · 0.37mm/px · 1 of 51 slices shown]
[im 26/51  brain]
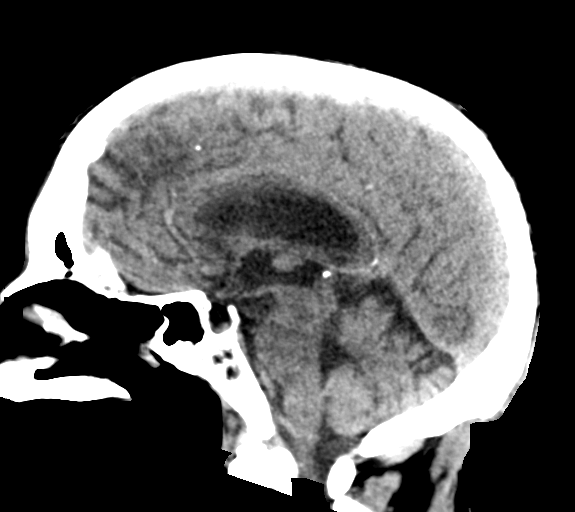

[Series 10: c_spine 1.0 st thins · axial · 0.31mm/px · z∈[+1106,+1213]mm · 6 of 256 slices shown]
[im 18/256  brain]
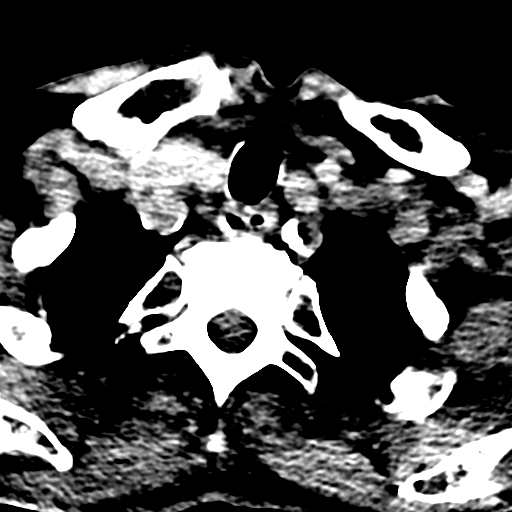
[im 52/256  brain]
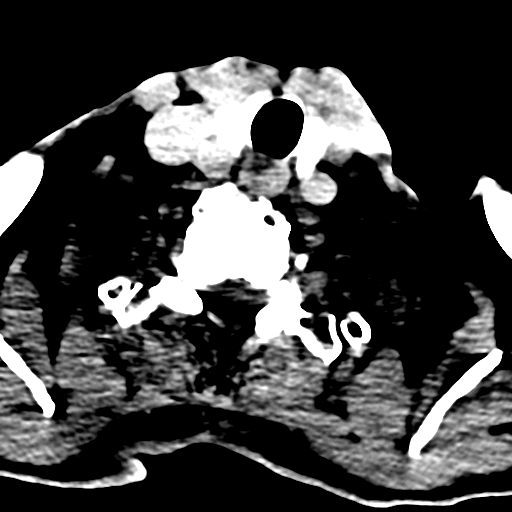
[im 86/256  brain]
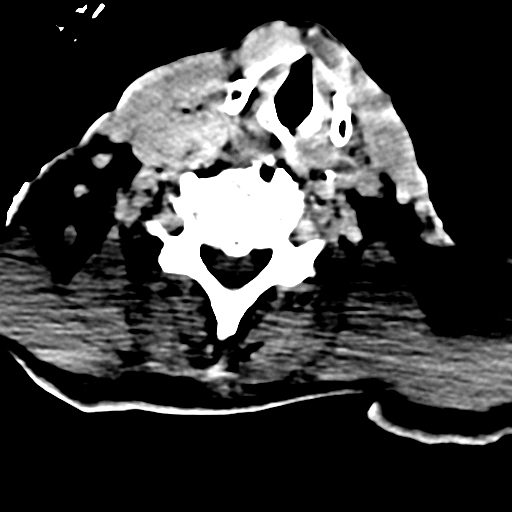
[im 120/256  brain]
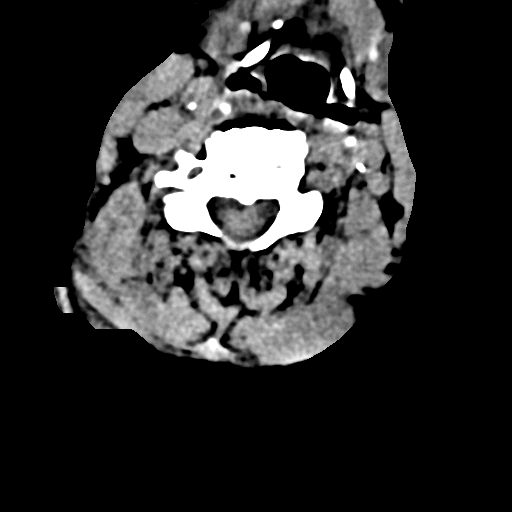
[im 137/256  brain]
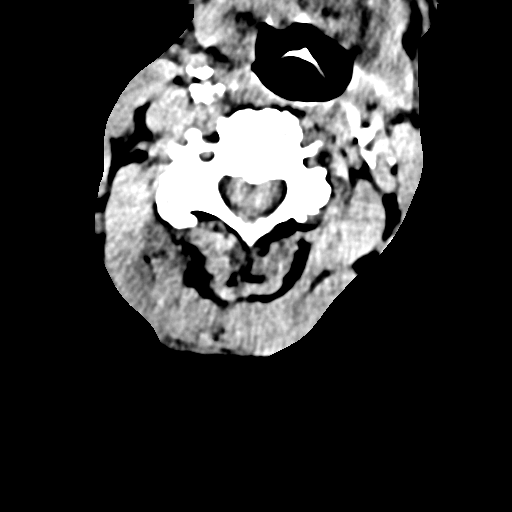
[im 171/256  brain]
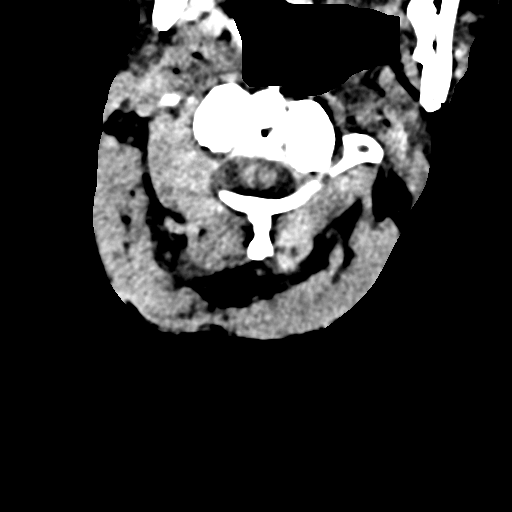

[15 of 47 positions shown; findings below may reference images not displayed]

FINDINGS: CT HEAD FINDINGS

Brain: Geographic region of hypoattenuation within the left frontal
lobe, caudate, internal capsule and lentiform nucleus compatible
with a left MCA distribution infarct. No evidence of hemorrhage,
hydrocephalus, extra-axial collection or mass lesion/mass effect.
Patchy areas of white matter hypoattenuation are most compatible
with chronic microvascular angiopathy. Symmetric prominence of the
ventricles, cisterns and sulci compatible with parenchymal volume
loss.

Vascular: Hyperattenuation of the left MCA concerning for
acute/subacute thrombus. Atherosclerotic calcification of the
carotid siphons.

Skull: No calvarial fracture or suspicious osseous lesion. No scalp
swelling or hematoma.

Sinuses/Orbits: Paranasal sinuses and mastoid air cells are
predominantly clear. Orbital structures are unremarkable aside from
prior lens extractions.

Other: Edentulous with mandibular prognathism

CT CERVICAL SPINE FINDINGS

Alignment: Cervical stabilization collar in place. Slight reversal
of the normal cervical lordosis centered at C5. Craniocervical and
atlantoaxial alignment is maintained.

Skull base and vertebrae: No acute fracture. No primary bone lesion
or focal pathologic process. Extensive subcortical cystic changes
noted in the dens.

Soft tissues and spinal canal: No pre or paravertebral fluid or
swelling. No visible canal hematoma.

Disc levels: Multilevel intervertebral disc height loss with
spondylitic endplate changes. Posterior disc osteophyte complexes
are noted throughout the cervical spine resulting in at most mild
canal stenosis. Uncinate spurring and facet hypertrophic changes
result in multilevel neural foraminal narrowing, severe on the left
at C2-3, C3-4 and C4-5 and mild severe on the right at C4-5 and
C7-T1. Additional mild-to-moderate foraminal stenoses are seen
diffusely

Upper chest: Biapical pleuroparenchymal scarring. Upper lungs are
otherwise clear. Extensive vascular calcification in the cervical
carotids.

Other: None.
IMPRESSION: 1. Hyperattenuation of the left MCA concerning for acute/subacute
thrombus.
2. Geographic region of hypoattenuation within the left frontal
lobe, caudate, internal capsule, and lentiform nucleus compatible
with a left MCA distribution infarct.
3. No scalp swelling or calvarial fracture.
4. No acute cervical spine fracture.
5. Moderate to severe multilevel degenerative changes throughout the
cervical spine, detailed above.

These results were called by telephone at the time of interpretation
on 07/01/2019 at [DATE] to Dr. Kirchner , who verbally acknowledged
these results.
